# Patient Record
Sex: Female | Born: 1970 | Race: White | Hispanic: No | Marital: Married | Smoking: Former smoker
Health system: Southern US, Community
[De-identification: ages and names within clinical notes are randomized; demographics above are authoritative.]

## PROBLEM LIST (undated history)

## (undated) DIAGNOSIS — F319 Bipolar disorder, unspecified: Secondary | ICD-10-CM

## (undated) DIAGNOSIS — M1990 Unspecified osteoarthritis, unspecified site: Secondary | ICD-10-CM

## (undated) DIAGNOSIS — A609 Anogenital herpesviral infection, unspecified: Secondary | ICD-10-CM

## (undated) DIAGNOSIS — R87629 Unspecified abnormal cytological findings in specimens from vagina: Secondary | ICD-10-CM

## (undated) DIAGNOSIS — O24419 Gestational diabetes mellitus in pregnancy, unspecified control: Secondary | ICD-10-CM

## (undated) HISTORY — PX: ANKLE RECONSTRUCTION: SHX1151

## (undated) HISTORY — DX: Anogenital herpesviral infection, unspecified: A60.9

## (undated) HISTORY — DX: Unspecified osteoarthritis, unspecified site: M19.90

## (undated) HISTORY — DX: Unspecified abnormal cytological findings in specimens from vagina: R87.629

## (undated) HISTORY — PX: LEEP: SHX91

---

## 1997-11-20 ENCOUNTER — Inpatient Hospital Stay (HOSPITAL_COMMUNITY): Admission: AD | Admit: 1997-11-20 | Discharge: 1997-11-20 | Payer: Self-pay | Admitting: *Deleted

## 1997-11-21 ENCOUNTER — Ambulatory Visit (HOSPITAL_COMMUNITY): Admission: RE | Admit: 1997-11-21 | Discharge: 1997-11-21 | Payer: Self-pay | Admitting: *Deleted

## 1999-04-04 ENCOUNTER — Inpatient Hospital Stay (HOSPITAL_COMMUNITY): Admission: AD | Admit: 1999-04-04 | Discharge: 1999-04-04 | Payer: Self-pay | Admitting: *Deleted

## 2000-07-29 ENCOUNTER — Other Ambulatory Visit: Admission: RE | Admit: 2000-07-29 | Discharge: 2000-07-29 | Payer: Self-pay | Admitting: Gynecology

## 2000-10-25 ENCOUNTER — Other Ambulatory Visit: Admission: RE | Admit: 2000-10-25 | Discharge: 2000-10-25 | Payer: Self-pay | Admitting: Family Medicine

## 2000-11-17 ENCOUNTER — Encounter (INDEPENDENT_AMBULATORY_CARE_PROVIDER_SITE_OTHER): Payer: Self-pay

## 2000-11-17 ENCOUNTER — Other Ambulatory Visit: Admission: RE | Admit: 2000-11-17 | Discharge: 2000-11-17 | Payer: Self-pay | Admitting: Obstetrics and Gynecology

## 2000-12-29 ENCOUNTER — Ambulatory Visit (HOSPITAL_COMMUNITY): Admission: RE | Admit: 2000-12-29 | Discharge: 2000-12-29 | Payer: Self-pay | Admitting: Obstetrics and Gynecology

## 2000-12-29 ENCOUNTER — Encounter (INDEPENDENT_AMBULATORY_CARE_PROVIDER_SITE_OTHER): Payer: Self-pay

## 2001-04-11 ENCOUNTER — Other Ambulatory Visit: Admission: RE | Admit: 2001-04-11 | Discharge: 2001-04-11 | Payer: Self-pay | Admitting: Obstetrics and Gynecology

## 2001-08-01 ENCOUNTER — Other Ambulatory Visit: Admission: RE | Admit: 2001-08-01 | Discharge: 2001-08-01 | Payer: Self-pay | Admitting: Obstetrics and Gynecology

## 2002-01-02 ENCOUNTER — Other Ambulatory Visit: Admission: RE | Admit: 2002-01-02 | Discharge: 2002-01-02 | Payer: Self-pay | Admitting: Obstetrics and Gynecology

## 2002-04-18 ENCOUNTER — Other Ambulatory Visit: Admission: RE | Admit: 2002-04-18 | Discharge: 2002-04-18 | Payer: Self-pay | Admitting: Obstetrics and Gynecology

## 2002-11-28 ENCOUNTER — Other Ambulatory Visit: Admission: RE | Admit: 2002-11-28 | Discharge: 2002-11-28 | Payer: Self-pay | Admitting: Obstetrics and Gynecology

## 2003-04-10 ENCOUNTER — Ambulatory Visit (HOSPITAL_BASED_OUTPATIENT_CLINIC_OR_DEPARTMENT_OTHER): Admission: RE | Admit: 2003-04-10 | Discharge: 2003-04-11 | Payer: Self-pay | Admitting: Orthopedic Surgery

## 2003-04-15 ENCOUNTER — Emergency Department (HOSPITAL_COMMUNITY): Admission: EM | Admit: 2003-04-15 | Discharge: 2003-04-15 | Payer: Self-pay | Admitting: Emergency Medicine

## 2003-04-15 ENCOUNTER — Encounter: Payer: Self-pay | Admitting: *Deleted

## 2003-05-29 ENCOUNTER — Other Ambulatory Visit: Admission: RE | Admit: 2003-05-29 | Discharge: 2003-05-29 | Payer: Self-pay | Admitting: Obstetrics and Gynecology

## 2003-06-07 ENCOUNTER — Encounter: Admission: RE | Admit: 2003-06-07 | Discharge: 2003-07-11 | Payer: Self-pay | Admitting: Orthopedic Surgery

## 2003-07-30 ENCOUNTER — Ambulatory Visit (HOSPITAL_BASED_OUTPATIENT_CLINIC_OR_DEPARTMENT_OTHER): Admission: RE | Admit: 2003-07-30 | Discharge: 2003-07-30 | Payer: Self-pay | Admitting: Orthopedic Surgery

## 2003-09-20 ENCOUNTER — Encounter: Admission: RE | Admit: 2003-09-20 | Discharge: 2003-12-03 | Payer: Self-pay | Admitting: Orthopedic Surgery

## 2004-01-22 ENCOUNTER — Other Ambulatory Visit
Admission: RE | Admit: 2004-01-22 | Discharge: 2004-01-22 | Payer: Self-pay | Admitting: Physical Medicine & Rehabilitation

## 2004-08-04 ENCOUNTER — Ambulatory Visit (HOSPITAL_COMMUNITY): Admission: RE | Admit: 2004-08-04 | Discharge: 2004-08-04 | Payer: Self-pay | Admitting: Sports Medicine

## 2004-08-12 ENCOUNTER — Other Ambulatory Visit: Admission: RE | Admit: 2004-08-12 | Discharge: 2004-08-12 | Payer: Self-pay | Admitting: Obstetrics and Gynecology

## 2005-02-05 ENCOUNTER — Other Ambulatory Visit: Admission: RE | Admit: 2005-02-05 | Discharge: 2005-02-05 | Payer: Self-pay | Admitting: Obstetrics and Gynecology

## 2005-06-04 ENCOUNTER — Inpatient Hospital Stay (HOSPITAL_COMMUNITY): Admission: RE | Admit: 2005-06-04 | Discharge: 2005-06-08 | Payer: Self-pay | Admitting: Psychiatry

## 2005-06-05 ENCOUNTER — Ambulatory Visit: Payer: Self-pay | Admitting: Psychiatry

## 2005-09-03 ENCOUNTER — Inpatient Hospital Stay (HOSPITAL_COMMUNITY): Admission: AD | Admit: 2005-09-03 | Discharge: 2005-09-03 | Payer: Self-pay | Admitting: Obstetrics and Gynecology

## 2005-09-10 ENCOUNTER — Emergency Department (HOSPITAL_COMMUNITY): Admission: EM | Admit: 2005-09-10 | Discharge: 2005-09-10 | Payer: Self-pay | Admitting: Emergency Medicine

## 2005-11-07 ENCOUNTER — Inpatient Hospital Stay (HOSPITAL_COMMUNITY): Admission: AD | Admit: 2005-11-07 | Discharge: 2005-11-08 | Payer: Self-pay | Admitting: Obstetrics and Gynecology

## 2005-11-09 ENCOUNTER — Inpatient Hospital Stay (HOSPITAL_COMMUNITY): Admission: AD | Admit: 2005-11-09 | Discharge: 2005-11-12 | Payer: Self-pay | Admitting: Obstetrics & Gynecology

## 2007-03-19 ENCOUNTER — Inpatient Hospital Stay (HOSPITAL_COMMUNITY): Admission: AD | Admit: 2007-03-19 | Discharge: 2007-03-19 | Payer: Self-pay | Admitting: Obstetrics and Gynecology

## 2007-09-13 ENCOUNTER — Encounter: Admission: RE | Admit: 2007-09-13 | Discharge: 2007-09-16 | Payer: Self-pay | Admitting: Obstetrics and Gynecology

## 2007-11-03 ENCOUNTER — Inpatient Hospital Stay (HOSPITAL_COMMUNITY): Admission: RE | Admit: 2007-11-03 | Discharge: 2007-11-05 | Payer: Self-pay | Admitting: Obstetrics and Gynecology

## 2007-11-13 ENCOUNTER — Inpatient Hospital Stay (HOSPITAL_COMMUNITY): Admission: AD | Admit: 2007-11-13 | Discharge: 2007-11-13 | Payer: Self-pay | Admitting: Obstetrics & Gynecology

## 2009-04-15 ENCOUNTER — Emergency Department (HOSPITAL_COMMUNITY): Admission: EM | Admit: 2009-04-15 | Discharge: 2009-04-15 | Payer: Self-pay | Admitting: Family Medicine

## 2010-08-14 ENCOUNTER — Emergency Department (HOSPITAL_COMMUNITY)
Admission: EM | Admit: 2010-08-14 | Discharge: 2010-08-14 | Payer: Self-pay | Source: Home / Self Care | Admitting: Emergency Medicine

## 2010-08-17 ENCOUNTER — Emergency Department (HOSPITAL_COMMUNITY)
Admission: EM | Admit: 2010-08-17 | Discharge: 2010-08-17 | Payer: Self-pay | Source: Home / Self Care | Admitting: Emergency Medicine

## 2010-08-29 ENCOUNTER — Ambulatory Visit: Payer: Self-pay | Admitting: Internal Medicine

## 2010-08-29 DIAGNOSIS — J4489 Other specified chronic obstructive pulmonary disease: Secondary | ICD-10-CM | POA: Insufficient documentation

## 2010-08-29 DIAGNOSIS — J449 Chronic obstructive pulmonary disease, unspecified: Secondary | ICD-10-CM

## 2010-08-29 DIAGNOSIS — F319 Bipolar disorder, unspecified: Secondary | ICD-10-CM

## 2010-08-29 DIAGNOSIS — R911 Solitary pulmonary nodule: Secondary | ICD-10-CM

## 2010-09-20 ENCOUNTER — Encounter: Payer: Self-pay | Admitting: Sports Medicine

## 2010-10-02 NOTE — Assessment & Plan Note (Signed)
Summary: NEW TO EST/LUNG PROBLEM//PH   Vital Signs:  Patient profile:   40 year old female Height:      62.75 inches Weight:      130.4 pounds BMI:     23.37 Temp:     98.7 degrees F oral Pulse rate:   72 / minute Resp:     14 per minute BP sitting:   122 / 70  (left arm) Cuff size:   regular  Vitals Entered By: Shonna Chock CMA (August 29, 2010 12:01 PM) CC: New patient establish: Follow-up from [facility] (abnormal chest xray) Comments Patient accompanied by her Mother today    CC:  New patient establish: Follow-up from [facility] (abnormal chest xray).  History of Present Illness:    Alima is here to establish as a new patient. She was seen for acute bronchitis 08/17/2010 @ [facility] ER ;an incidental finding on CXray  was   a 14 mm nodule @ the [facility].  Her bronchitis has improved , but her smoker's cough persists. She started smoking @ age 58 ; she had  been @ a ppd  X 15 years; now 25 cigarettes in past 3 years.  Preventive Screening-Counseling & Management  Caffeine-Diet-Exercise     Does Patient Exercise: yes  Current Medications (verified): 1)  Zyprexa 20 Mg Tabs (Olanzapine) .Marland Kitchen.. 1 By Mouth Once Daily 2)  Lamictal Xr 300 Mg Xr24h-Tab (Lamotrigine) .Marland Kitchen.. 1 By Mouth Once Daily 3)  Valtrex 500 Mg Tabs (Valacyclovir Hcl) .Marland Kitchen.. 1 By Mouth Once Daily 4)  Tri-Sprintec 0.18/0.215/0.25 Mg-35 Mcg Tabs (Norgestim-Eth Estrad Triphasic) .... As Directed 5)  Vesicare 5 Mg Tabs (Solifenacin Succinate) .Marland Kitchen.. 1 By Mouth Once Daily (? 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         Weight variable Resp:  Denies chest pain with inspiration, coughing up blood, shortness of breath, and wheezing.  Physical Exam  General:  well-nourished,in no acute distress; alert,appropriate and cooperative throughout examination Head:  Normocephalic and atraumatic without obvious abnormalities.  Eyes:  No corneal or conjunctival inflammation noted.  Perrla. Funduscopic exam benign, without hemorrhages, exudates or papilledema.  Ears:  External ear exam shows no significant lesions or deformities.  Otoscopic examination reveals clear canals, tympanic membranes are intact bilaterally without bulging, retraction, inflammation or discharge. Hearing is grossly normal bilaterally. Nose:  External nasal examination shows no deformity or inflammation. External jewelry. Nasal mucosa are pink and moist without lesions or exudates. Mouth:  Oral mucosa and oropharynx without lesions or exudates.  Teeth in good repair. Hoarse Neck:  No deformities, masses, or tenderness noted. Lungs:  Normal respiratory effort, chest expands symmetrically. Lungs : scattered  coarse rhonchi , crackles  & wheezes. Heart:  Normal rate and regular rhythm. S1 and S2  normal without gallop, murmur, click, rub or other extra sounds. Abdomen:  Bowel sounds positive,abdomen soft and non-tender without masses, organomegaly or hernias noted. Pulses:  R and L carotid,radial,dorsalis pedis and posterior tibial pulses are full and equal  bilaterally Extremities:  No clubbing, cyanosis, edema, or deformity noted with normal full range of motion of all joints.   Neurologic:  alert & oriented X3 and DTRs symmetrical and normal.   Skin:  Intact without suspicious lesions or rashes Cervical Nodes:  No lymphadenopathy noted Axillary Nodes:  No palpable lymphadenopathy Psych:  memory intact for recent and remote, normally interactive, and good eye contact.     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 1-2 puffs every 12 hrs ; gargle & spit after use  Problem # 3:  CIGARETTE SMOKER (ICD-305.1)  Problem # 4:  BIPOLAR DISORDER UNSPECIFIED (ICD-296.80)  Complete Medication List: 1)  Zyprexa 20 Mg Tabs (Olanzapine) .Marland Kitchen.. 1 by mouth once daily 2)  Lamictal Xr 300 Mg Xr24h-tab (Lamotrigine) .Marland Kitchen.. 1 by mouth once daily 3)  Valtrex 500 Mg Tabs (Valacyclovir hcl) .Marland Kitchen.. 1 by mouth once daily 4)  Tri-sprintec 0.18/0.215/0.25 Mg-35 Mcg Tabs (Norgestim-eth estrad triphasic) .... As directed 5)  Vesicare 5 Mg Tabs (Solifenacin succinate) .Marland Kitchen.. 1 by mouth once daily (? dose) 6)  Symbicort 80-4.5 Mcg/act Aero (Budesonide-formoterol fumarate) .Marland Kitchen.. 1-2 puffs every 12 hrs ; gargle & spit after use  Patient Instructions: 1)  Please schedule a follow-up appointment in 6  weeks. 2)  Drink as much fluid as you can tolerate for the next few days. 3)  Stop Smoking Tips: Choose a Quit date. Cut down before the Quit date. decide what you will do as a substitute when you feel the urge to smoke(gum,toothpick,exercise). Prescriptions: SYMBICORT 80-4.5 MCG/ACT AERO (BUDESONIDE-FORMOTEROL FUMARATE) 1-2 puffs every 12 hrs ; gargle & spit after use  #1 x 1   Entered and Authorized by:   Marga Melnick MD   Signed by:    Marga Melnick MD on 08/29/2010   Method used:   Print then Give to Patient   RxID:   [id number]    Orders Added: 1)  New Patient Level IV [99204]

## 2010-10-10 ENCOUNTER — Ambulatory Visit: Payer: Self-pay | Admitting: Internal Medicine

## 2010-10-13 ENCOUNTER — Ambulatory Visit (INDEPENDENT_AMBULATORY_CARE_PROVIDER_SITE_OTHER): Payer: BC Managed Care – PPO | Admitting: Internal Medicine

## 2010-10-13 ENCOUNTER — Encounter: Payer: Self-pay | Admitting: Internal Medicine

## 2010-10-13 DIAGNOSIS — O99810 Abnormal glucose complicating pregnancy: Secondary | ICD-10-CM | POA: Insufficient documentation

## 2010-10-13 DIAGNOSIS — R5381 Other malaise: Secondary | ICD-10-CM

## 2010-10-13 DIAGNOSIS — R05 Cough: Secondary | ICD-10-CM

## 2010-10-13 DIAGNOSIS — R5383 Other fatigue: Secondary | ICD-10-CM | POA: Insufficient documentation

## 2010-10-15 ENCOUNTER — Other Ambulatory Visit: Payer: BC Managed Care – PPO

## 2010-10-20 ENCOUNTER — Other Ambulatory Visit: Payer: Self-pay | Admitting: Internal Medicine

## 2010-10-20 ENCOUNTER — Other Ambulatory Visit: Payer: BC Managed Care – PPO

## 2010-10-20 ENCOUNTER — Encounter (INDEPENDENT_AMBULATORY_CARE_PROVIDER_SITE_OTHER): Payer: Self-pay | Admitting: *Deleted

## 2010-10-20 ENCOUNTER — Ambulatory Visit (INDEPENDENT_AMBULATORY_CARE_PROVIDER_SITE_OTHER)
Admission: RE | Admit: 2010-10-20 | Discharge: 2010-10-20 | Disposition: A | Discharge status: Home / Self Care | Payer: BC Managed Care – PPO | Source: Ambulatory Visit | Attending: Internal Medicine | Admitting: Internal Medicine

## 2010-10-20 DIAGNOSIS — F17200 Nicotine dependence, unspecified, uncomplicated: Secondary | ICD-10-CM

## 2010-10-20 DIAGNOSIS — R5383 Other fatigue: Secondary | ICD-10-CM

## 2010-10-20 DIAGNOSIS — R5381 Other malaise: Secondary | ICD-10-CM

## 2010-10-20 DIAGNOSIS — R05 Cough: Secondary | ICD-10-CM

## 2010-10-20 DIAGNOSIS — O99810 Abnormal glucose complicating pregnancy: Secondary | ICD-10-CM

## 2010-10-20 LAB — CBC WITH DIFFERENTIAL/PLATELET
Basophils Absolute: 0 10*3/uL (ref 0.0–0.1)
Basophils Relative: 0.3 % (ref 0.0–3.0)
Eosinophils Absolute: 0.1 10*3/uL (ref 0.0–0.7)
Eosinophils Relative: 0.6 % (ref 0.0–5.0)
HCT: 40.5 % (ref 36.0–46.0)
Hemoglobin: 13.9 g/dL (ref 12.0–15.0)
Lymphocytes Relative: 16 % (ref 12.0–46.0)
Lymphs Abs: 1.7 10*3/uL (ref 0.7–4.0)
MCHC: 34.3 g/dL (ref 30.0–36.0)
MCV: 98.1 fl (ref 78.0–100.0)
Monocytes Absolute: 0.5 10*3/uL (ref 0.1–1.0)
Monocytes Relative: 4.3 % (ref 3.0–12.0)
Neutro Abs: 8.4 10*3/uL — ABNORMAL HIGH (ref 1.4–7.7)
Neutrophils Relative %: 78.8 % — ABNORMAL HIGH (ref 43.0–77.0)
Platelets: 252 10*3/uL (ref 150.0–400.0)
RBC: 4.12 Mil/uL (ref 3.87–5.11)
RDW: 13.6 % (ref 11.5–14.6)
WBC: 10.6 10*3/uL — ABNORMAL HIGH (ref 4.5–10.5)

## 2010-10-20 LAB — BASIC METABOLIC PANEL
BUN: 12 mg/dL (ref 6–23)
CO2: 25 mEq/L (ref 19–32)
Calcium: 9.4 mg/dL (ref 8.4–10.5)
Chloride: 108 mEq/L (ref 96–112)
Creatinine, Ser: 1.1 mg/dL (ref 0.4–1.2)
GFR: 57.36 mL/min — ABNORMAL LOW (ref >60.00)
Glucose, Bld: 96 mg/dL (ref 70–99)
Potassium: 4.9 mEq/L (ref 3.5–5.1)
Sodium: 138 mEq/L (ref 135–145)

## 2010-10-20 LAB — T4, FREE: Free T4: 0.78 ng/dL (ref 0.60–1.60)

## 2010-10-20 LAB — HEMOGLOBIN A1C: Hgb A1c MFr Bld: 5.4 % (ref 4.6–6.5)

## 2010-10-20 LAB — TSH: TSH: 1.35 u[IU]/mL (ref 0.35–5.50)

## 2010-10-20 LAB — HEPATIC FUNCTION PANEL
ALT: 12 U/L (ref 0–35)
AST: 13 U/L (ref 0–37)
Albumin: 3.8 g/dL (ref 3.5–5.2)
Alkaline Phosphatase: 31 U/L — ABNORMAL LOW (ref 39–117)
Bilirubin, Direct: 0.1 mg/dL (ref 0.0–0.3)
Total Bilirubin: 0.2 mg/dL — ABNORMAL LOW (ref 0.3–1.2)
Total Protein: 6.5 g/dL (ref 6.0–8.3)

## 2010-10-22 NOTE — Assessment & Plan Note (Signed)
Summary: 6 WEEK FOLLOWUP///SPH   Vital Signs:  Patient profile:   40 year old female Weight:      133 pounds BMI:     23.83 Pulse rate:   64 / minute Resp:     14 per minute BP sitting:   102 / 68  (left arm) Cuff size:   regular  Vitals Entered By: Shonna Chock CMA (October 13, 2010 10:16 AM) CC: 6 week follow-up and fatigue  , COPD follow-up, Fatigue   CC:  6 week follow-up and fatigue  , COPD follow-up, and Fatigue.  History of Present Illness:      This is a 40 year old woman who presents for  persistent cough following CAP 07/2010.  The patient reports wheezing, cough, and increased sputum in am Despite the cough she  denies shortness of breath, chest tightness, heat intolerance, nocturnal awakening, and exercise induced symptoms.  The patient reports no limitation in activities.  Medication use includes controller med intermittently, Symbicort once weekly.  Symptoms are worse  with cold exposure &  smoking; she is smoking 1 cigarette / hour but she is on a  weaning program. She quit with  inhaled nicotine from  cartridges for 5 months with 2nd pregnancy.She had Gestational Diabetes.      The patient also presents with Fatigue  for 3 weeks in context of children's school schedule.  The patient reports  intermittent fatigue and fatigue even without physical  activity  The patient denies fever, night sweats, weight loss, and hemoptysis.  The patient denies the following symptoms: leg swelling, orthopnea, PND, melena, adenopathy, severe snoring, daytime sleepiness, apnea, hair , nail and skin changes.    Current Medications (verified): 1)  Zyprexa 20 Mg Tabs (Olanzapine) .Marland Kitchen.. 1 By Mouth Once Daily 2)  Lamictal Xr 300 Mg Xr24h-Tab (Lamotrigine) .Marland Kitchen.. 1 By Mouth Once Daily 3)  Valtrex 500 Mg Tabs (Valacyclovir Hcl) .Marland Kitchen.. 1 By Mouth Once Daily 4)  Tri-Sprintec 0.18/0.215/0.25 Mg-35 Mcg Tabs (Norgestim-Eth Estrad Triphasic) .... As Directed 5)  Symbicort 80-4.5 Mcg/act Aero  (Budesonide-Formoterol Fumarate) .Marland Kitchen.. 1-2 Puffs Every 12 Hrs ; Gargle & Spit After Use  Allergies (verified): No Known Drug Allergies  Physical Exam  General:  well-nourished,in no acute distress; alert,appropriate and cooperative throughout examination Eyes:  No corneal or conjunctival inflammation noted. EOMI. Perrla. No icterus Neck:  No deformities, masses, or tenderness noted. Lungs:  Normal respiratory effort, chest expands symmetrically. Lungs are clear to auscultation, no crackles or wheezes. Heart:  Normal rate and regular rhythm. S1 and S2 normal without gallop, murmur, click, rub . S4 Abdomen:  Bowel sounds positive,abdomen soft and non-tender without masses, organomegaly or hernias noted. Very faint bruit over aorta ; no AAA Pulses:  R and L carotid,radial,dorsalis pedis and posterior tibial pulses are full and equal bilaterally Extremities:  No clubbing, cyanosis, edema, or deformity noted  Neurologic:  alert & oriented X3 and DTRs symmetrical and normal.   Skin:  Intact without suspicious lesions or rashes. No jaundice Cervical Nodes:  No lymphadenopathy noted Axillary Nodes:  No palpable lymphadenopathy Psych:  memory intact for recent and remote, normally interactive, and good eye contact.     Impression & Recommendations:  Problem # 1:  COUGH (ICD-786.2)  Orders: T-2 View CXR (71020TC)  Problem # 2:  FATIGUE (ICD-780.79)  Problem # 3:  GESTATIONAL DIABETES (ICD-648.80)  Complete Medication List: 1)  Zyprexa 20 Mg Tabs (Olanzapine) .Marland Kitchen.. 1 by mouth once daily 2)  Lamictal Xr 300 Mg  Xr24h-tab (Lamotrigine) .Marland Kitchen.. 1 by mouth once daily 3)  Valtrex 500 Mg Tabs (Valacyclovir hcl) .Marland Kitchen.. 1 by mouth once daily 4)  Tri-sprintec 0.18/0.215/0.25 Mg-35 Mcg Tabs (Norgestim-eth estrad triphasic) .... As directed 5)  Symbicort 80-4.5 Mcg/act Aero (Budesonide-formoterol fumarate) .Marland Kitchen.. 1-2 puffs every 12 hrs ; gargle & spit after use  Patient Instructions: 1)  Use Symbicort 1-2  puffs every 12 hrs ; gargle & spit after use.Fasting labs & CXray  @ [address]; Codes :780.79, 786.2; 648.80. 2)  BMP ; 3)  Hepatic Panel ; 4)  TSH ;free T4;  5)  CBC w/ Diff ; 6)  HbgA1C . 7)  Stop Smoking Tips: Choose a Quit date. Cut down before the Quit date. decide what you will do as a substitute when you feel the urge to smoke(gum,toothpick,exercise, nicotine cartridges).PFTs may be needed if cough persists.   Orders Added: 1)  Est. Patient Level IV [04540] 2)  T-2 View CXR [71020TC]

## 2011-01-13 NOTE — Discharge Summary (Signed)
[name], [name]               ACCOUNT NO.:  [id number]   MEDICAL RECORD NO.:  [id number]          PATIENT TYPE:  INP   LOCATION:  9127                          FACILITY:  [facility]   PHYSICIAN:  Gerrit Friends. Aldona Bar, M.D.   DATE OF BIRTH:  09/23/1970   DATE OF ADMISSION:  11/03/2007  DATE OF DISCHARGE:                               DISCHARGE SUMMARY   DISCHARGE DIAGNOSIS:  Term pregnancy.  Previous Cesarean section,  gestational diabetes, insulin dependent.  Desire for repeat Cesarean  section.   PROCEDURES:  Repeat low-transverse Cesarean section.   SUMMARY:  This 40 year old gravida 2, para 1 was admitted at 39-[redacted] weeks  gestation for repeat Cesarean section by Dr. Dareen Piano.  Her pregnancy  was complicated by gestational diabetes requiring insulin.  She also has  a bipolar disorder and is on medication for that.   She underwent her Cesarean section on the day of admission, 03/05 with  delivery of a 7 pound 3 ounce female infant with Apgars of 9 and 9.  Her  postoperative course was uncomplicated.  Her discharge hemoglobin was  11.8 with a white count of 10,500 and a platelet count of 244,000.  On  the morning of 03/07 she was ambulating well, tolerating a regular diet  well, was bottle feeding without difficulty and was very desirous of  discharge.  Accordingly she was given all appropriate instructions and  discharge prescriptions for Tylox to use 1-2 every 4-6 hours as needed  for severe pain and Motrin 600 mg every 6 hours to use for less severe  pain or cramping.  She will return to the office on 03/09 to see Dr.  Dareen Piano to have her staples removed and her wound Steri-Stripped with  Benzoin.   DISCHARGE MEDICATIONS:  In addition to her Tylox and Motrin include  Zyprexa 10 mg at bedtime, Valtrex 500 mg daily, Effexor 37.5 mg - three  every morning and she will finish up her prenatal vitamins.  Her insulin  was discontinued after delivery and her sugars in the hospital  postpartum  have been normal.   CONDITION ON DISCHARGE:  Improved.      Gerrit Friends. Aldona Bar, M.D.  Electronically Signed     RMW/MEDQ  D:  11/05/2007  T:  11/07/2007  Job:  [id number]

## 2011-01-13 NOTE — Op Note (Signed)
[name], [name]               ACCOUNT NO.:  [id number]   MEDICAL RECORD NO.:  [id number]          PATIENT TYPE:  INP   LOCATION:  9127                          FACILITY:  [facility]   PHYSICIAN:  Malva Limes, M.D.    DATE OF BIRTH:  April 24, 1971   DATE OF PROCEDURE:  11/03/2007  DATE OF DISCHARGE:                               OPERATIVE REPORT   PREOPERATIVE DIAGNOSES:  1. Intrauterine pregnancy at term.  2. History of prior cesarean section.  3. Patient desires repeat cesarean section.  4. Gestational diabetes, insulin-dependent.   POSTOPERATIVE DIAGNOSES:  1. Intrauterine pregnancy at term.  2. History of prior cesarean section.  3. Patient desires repeat cesarean section.  4. Gestational diabetes, insulin-dependent.   PROCEDURE:  Repeat low transverse cesarean section.   SURGEON:  Dareen Piano.   ASSISTANT:  Bowie.   ANESTHESIA:  Spinal.   ANTIBIOTICS:  Ancef 1 gram.   DRAINS:  Foley to bedside drainage.   ESTIMATED BLOOD LOSS:  900 mL.   COMPLICATIONS:  None.   SPECIMENS:  None.   PROCEDURE:  The patient was taken to the operating room where spinal  anesthetic was administered without complications.  She was then placed  in the dorsal supine position with a left lateral tilt.  She was prepped  with Betadine and draped in the usual fashion for this procedure.  A  Pfannenstiel incision was made through the previous scar.  This was  carried down to the fascia.  The fascia was entered in the midline and  extended laterally with the Metzenbaum scissors.  The fascia was then  dissected from the rectus muscles with the Bovie.  Rectus muscle were  divided in the midline and taken superiorly and inferiorly.  Parietal  peritoneum was entered sharply.  The bladder flap was taken down  sharply.  A low transverse uterine incision was made in the midline and  extended laterally with blunt dissection.  Amniotic fluid was noted to  be clear.  The infant was delivered in a vertex  presentation.  There was  a nuchal cord times one which was reduced.  The remaining infant was  then delivered, the cord doubly clamped and cut and the infant handed to  the awaiting NICU team.  Placenta was manually removed.  The uterus was  exteriorized.  The uterine cavity was cleaned with a wet lap.  The  uterine incision was closed in a single layer of 0 Monocryl suture in a  running locking fashion.  The bladder flap was closed using 2-0 Monocryl  in a running fashion.  The uterus was placed back into the abdominal  cavity and hemostasis checked and felt to be adequate.  The parietal  peritoneum and rectus muscles were reapproximated in the midline with 2-  0 Monocryl suture.  The fascia was closed using 0 Monocryl suture in a  running fashion.  Subcuticular tissue was made hemostatic with the  Bovie.  Stainless steel clips were used to close the skin.  The patient  tolerated the procedure well.  She was taken to the recovery room in  stable  condition.  Instrument and lap counts were correct x2.           ______________________________  Malva Limes, M.D.     MA/MEDQ  D:  11/03/2007  T:  11/03/2007  Job:  [id number]

## 2011-01-16 NOTE — Discharge Summary (Signed)
NAMERUPINDER, LIVINGSTON               ACCOUNT NO.:  [id number]   MEDICAL RECORD NO.:  [id number]          PATIENT TYPE:  INP   LOCATION:  9132                          FACILITY:  [facility]   PHYSICIAN:  Malva Limes, M.D.    DATE OF BIRTH:  01-16-1971   DATE OF ADMISSION:  11/09/2005  DATE OF DISCHARGE:  11/12/2005                                 DISCHARGE SUMMARY   FINAL DIAGNOSES:  I am going to have to cancel this discharge.  I have got  two run.  I will finish dictating this chart later.  Just ignore this  dictation.  Thank you.      Leilani Able, P.A.-C.    ______________________________  Malva Limes, M.D.    MB/MEDQ  D:  12/05/2005  T:  12/05/2005  Job:  [id number]

## 2011-01-16 NOTE — H&P (Signed)
[facility]  Patient:    [name], [name]                         MRN: [id number] Adm. Date:  12/29/00 Attending:  Beather Arbour. Thomasena Edis, M.D. CC:         Doreatha Lew, M.D., [facility] [place]   History and Physical  DATE OF BIRTH:                [birth year].  HISTORY OF PRESENT ILLNESS:   The patient is a 40 year old gravida 2, referred by Dr. 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 Subsequently, the patient underwent colposcopically guided biopsies which showed severe dysplasia, CIN-3, at the anterior and posterior cervix.  With ECC, there was a minute detached fragment of dysplastic squamous mucosa admixed with scanty endocervical mucosa.  I believe this was due to the dropping of the cells from the [facility] onto the posterior cervix as these were being removed.  The patient has bipolar disorder and states that there is no way that she can undergo a LEEP in the office, that she must be sedated because she would move around and could not be cooperative.  I attempted to get her to agree to a LEEP in the office but she contends that there is no way she can do this without sedation; she is thus admitted for a loop electrical excision procedure due to CIN-3.  I have explained that at [facility] they use a Bovie cautery unit instead of a LEEP machine and thus we may achieve more cautery artifact with the LEEP than would otherwise be the case with the traditional LEEP machine; however, I do agree that sedation will be necessary to adequately treat her.  Risks of surgery including anesthetic complication, hemorrhage or infection, damage to adjacent structures including bladder, bowel, blood vessels or ureters were discussed with the patient.  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  The risks of multiple procedures to her cervix with respect to resulting in cervical stenosis, infertility and incompetent cervix as well as dysmenorrhea due to cervical stenosis were discussed with the patient.  She continues to express understanding of this.  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  She has no history of DES in utero exposure as she is too young for this to have been given.  She has a history of a condyloma and pelvic inflammatory disease.  She did have an ASCUS Pap one and a half years ago and has been followed with Pap smears every six months.  She has had 50 to 60 sexual partners over her life.  PAST MEDICAL HISTORY:         Bipolar disorder, addiction disorder -- these are followed by Dr. Hortencia Pilar.  She also has a history of a seasonal affective disorder.  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  PAST SURGICAL HISTORY:        History of a laser cone biopsy in 1996 at the [facility].  ALLERGIES:  No known drug allergies.  CURRENT MEDICATIONS:          Effexor XR 75 mg daily and Zyprexa 10 mg q.d.  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  Currently, she would like to return to obtain a masters degree and is applying to [employer] for a masters degree in rhetoric and linguistics.  FAMILY HISTORY:               Noncontributory.  PHYSICAL EXAMINATION:  VITAL SIGNS:                  Blood pressure 120/60.  Weight 136 pounds.  HEENT:                        Normal.  NECK:                         Supple without thyromegaly.  LUNGS:                         Clear to auscultation.  CARDIAC:                      Regular rate and rhythm, without murmurs, rubs, or gallops.  ABDOMEN:                      Soft and nontender.  No hepatosplenomegaly.  PELVIC:                       BUS normal.  Vulva and vagina are normal. Speculum examination revealed a normal cervix.  Bimanual examination revealed her uterus to be normal, nontender and mobile, without any adnexal mass or tenderness.  LABORATORY DATA:              HIV and RPR nonreactive.  Wet prep was performed and noted to be positive for clue cells and yeast; the patient was treated with Flagyl 500 mg p.o. b.i.d. to treat her bacterial vaginosis prior to proceeding with LEEP.  ASSESSMENT AND PLAN:          The patient is a 40 year old gravida 0 with cervical intraepithelial neoplasia, grade 3, on colposcopically guided biopsies, admitted for a loop electrosurgical excision procedure to be done with lidocaine with epinephrine and sedation.  Risks have been explained to the patient; she expresses understanding of and acceptance of these risks and desires to proceed with surgery. DD:  12/29/00 TD:  12/29/00 Job: [id number]

## 2011-01-16 NOTE — Discharge Summary (Signed)
[name], [name]               ACCOUNT NO.:  [id number]   MEDICAL RECORD NO.:  [id number]          PATIENT TYPE:  IPS   LOCATION:  0506                          FACILITY:  [facility]   PHYSICIAN:  Jeanice Lim, M.D. DATE OF BIRTH:  1970-10-21   DATE OF ADMISSION:  06/04/2005  DATE OF DISCHARGE:  06/08/2005                                 DISCHARGE SUMMARY   IDENTIFYING DATA:  This is a 40 year old Caucasian female, married,  voluntarily admitted.  Referred by Valinda Hoar, nurse practitioner.  History of bipolar disorder.  Had not slept for two weeks since stopping  Zyprexa.  Lost 16 pounds in the past two months.  Felt very unstable, crying  spells every day.  Feeling unsafe and helpless, tearful and agitated.  History of more than 10 psychiatric admissions.  Multiple admissions to  [facility] and psychiatric hospitalizations between 1992 and 1998.  History of prior suicide attempts.   MEDICATIONS:  Valtrex, prenatal vitamins.  Previously stable on Lexapro and  Zyprexa and Depakote stopped one year ago, the Depakote, and then gradually  stopped medications for different reasons, including pregnancy.   MENTAL STATUS EXAM:  Fully alert, bright affect, anxious, somewhat labile  but cooperative.  Speech normal, rapid pace.  Impulsive.  Mood anxious, some  guardedness.  Thought processes goal directed.  Mild general suspiciousness.  No other psychotic symptoms.  Not sleeping.  Mood unstable.  Doing poorly,  which is beginning to be a physical stressor as well as an emotional  stressor.  She is now willing to take risks with psychiatric medications  because of the severity of her psychiatric condition, which again includes  over 10 psychiatric admissions since a young age.  Becomes very suicidal  when unstable.  Cognitively intact.  No other psychotic symptoms such as  hallucinations at this time.   ADMISSION DIAGNOSES:  AXIS I:  Bipolar disorder, type 1, mixed state.  Polysubstance abuse in a six-year remission.  AXIS II:  Deferred.  AXIS III:  Intrauterine pregnancy, 14-week gestation.  AXIS IV:  Moderate (pregnancy and managing bipolar disorder off of  medications).  AXIS V:  30/65.   HOSPITAL COURSE:  The patient was admitted and ordered routine p.r.n.  medications and underwent further monitoring.  Was encouraged to participate  in individual, group and milieu therapy.  Was placed on safety checks.  Dr.  __________ was contacted on two occasions.  Doctor's office contacted  regarding medications to get approval of medications that she clearly  requires to remain safe.  The patient was aware of the risk/benefit ratio  and alternative treatments including potential risks to the fetus but felt  that the severity of her illness and this being untreated clearly was worse  than the risk of her taking medications, which was clearly accurate based on  the patient's history and presentation.  The patient was stabilized on  medications.  There was further communication with OB/GYN and with  outpatient providers and with support system and patient showed a marked  improvement, improved sleep, improved appetite, thinking, felt much more  stable and in  control and much more hopeful.   CONDITION ON DISCHARGE:  Discharged in significantly improved condition.  Aware of the chance of metabolic issues with Zyprexa, including diabetes  and, in this case, gestational diabetes, triglyceride abnormalities, weight  gain and, if significant abnormalities occur, then patient will need to be  switched to another medication.  However, due to her response, she can have  close monitoring of these parameters including hemoglobin C and lipid panel  and fasting glucoses and attempt to continue these medications since she has  had a robust response and has a very serious condition which has not  responded to other forms of treatment.  OB/GYN doctor also was aware of  metabolic  issues with Zyprexa and safety of medications and in agreement  that patient required medications.  The patient was set up with follow-up  appointments with Dr. Evelene Croon on Wednesday, June 24, 2005 at 68 and [facility] on Tuesday, June 09, 2005 at 3:30 p.m.   DISCHARGE MEDICATIONS:  1.  Lexapro 5 mg daily.  2.  Vistaril 50 mg q.12h. p.r.n. anxiety.  3.  Zyprexa 10 mg at 9 p.m.   DISCHARGE DIAGNOSES:  AXIS I:  Bipolar disorder, type 1, mixed state.  Polysubstance abuse in a six-year remission.  AXIS II:  Deferred.  AXIS III:  Intrauterine pregnancy, 14-week gestation.  AXIS IV:  Moderate (pregnancy and managing bipolar disorder off of  medications).  AXIS V:  GAF on discharge 55.      Jeanice Lim, M.D.  Electronically Signed     JEM/MEDQ  D:  07/05/2005  T:  07/05/2005  Job:  [id number]

## 2011-01-16 NOTE — Discharge Summary (Signed)
[name], [name]               ACCOUNT NO.:  [id number]   MEDICAL RECORD NO.:  [id number]          PATIENT TYPE:  INP   LOCATION:  9132                          FACILITY:  [facility]   PHYSICIAN:  Malva Limes, M.D.    DATE OF BIRTH:  1970/10/02   DATE OF ADMISSION:  11/09/2005  DATE OF DISCHARGE:  11/12/2005                                 DISCHARGE SUMMARY   FINAL DIAGNOSES:  1.  Intrauterine pregnancy at 36-1/[redacted] weeks gestation.  2.  Spontaneous rupture of membranes.  3.  Cephalopelvic disproportion.   PROCEDURE:  Primary low transverse cesarean section.  Surgeon Dr. Malva Limes.  Complications none.   HISTORY:  This 40 year old G1, P0 presents at 36-1/[redacted] weeks gestation with  spontaneous rupture of membranes.  The patient's antepartum course up to  this point had been complicated by her history of bipolar disease.  The  patient was on Zyprexa at the beginning of her pregnancy for this and on  some Lexapro.  The patient also has a history of HSV and was taking Valtrex  daily.  The patient also had a history of drug use but has been clean for  about 6 years.  She was admitted on November 09, 2005.   HOSPITAL COURSE:  She completed the first stage of labor without difficulty.  She pushed for about 2 hours, and the head did not descend past a zero  station.  At this point, a discussion was had with the patient, and the  decision was made to proceed with a cesarean section secondary to  cephalopelvic disproportion.  The patient was taken to the operating room on  November 09, 2005 by Dr. Malva Limes where a primary low transverse cesarean  section was performed with the delivery of a 7 pound 8 ounces female infant  with Apgars of 9/9. Delivery went without complications.  The patient's  postoperative course was complicated by some mild postoperative anemia, but  otherwise was normal.  The baby was circumcised before discharge, and the  patient was felt ready for discharge on postoperative  day #3.   DISCHARGE DIET:  She was sent home on her regular diet.   ACTIVITY:  She was told to decrease her activities.   DISCHARGE MEDICATIONS:  1.  She was given Percocet 1-2 every 4 hours as needed for pain.  2.  She was told she could use over-the-counter ibuprofen up to 600 mg every      6 hours needed for pain.  3.  She was to continue her prenatal vitamins.  She was also to continue her medicines that she takes on a daily basis which  are:  1.  Lexapro.  2.  Zyprexa.  3.  Valtrex.   LABORATORIES ON DISCHARGE:  The patient had a hemoglobin of 9.4, white blood  cell count of 14.1 and platelets of 249,000.      Leilani Able, P.A.-C.    ______________________________  Malva Limes, M.D.    MB/MEDQ  D:  12/05/2005  T:  12/06/2005  Job:  [id number]

## 2011-01-16 NOTE — Op Note (Signed)
[facility]  Patient:    [name], [name]                       MRN: [id number] Proc. Date: 12/29/00 Adm. Date:  [id number] Attending:  Madelyn Flavors CCDeboraha Sprang [facility]ne[facility] [address] M.D. 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  PROCEDURE:                    Loop electrical excision procedure.  SURGEON:                      Beather Arbour. Thomasena Edis, M.D., Ph.D.  ANESTHESIA:                   IV sedation plus 1% lidocaine with                               1:200,000 epinephrine paracervical block.  COMPLICATIONS:                None.  FLUIDS:                       Approximately 500 cc of crystalloid.  ESTIMATED BLOOD LOSS:         Minimal.  DESCRIPTION OF PROCEDURE:     The patient was brought to the operating room, identified on the operating room table. After adequate IV sedation was achieved using Versed and propofol, the patient was placed in the dorsal lithotomy position and draped in the usual sterile fashion. Bimanual examination revealed the uterus to be in ______ position, anteverted, small, and mobile. The LEEP speculum was placed. A careful and thorough colposcopic examination was again performed with the CIN-3 areas identified. The 10 mm x 10 mm loop electrode was selected as this was deemed to excise the squamocolumnar junction with one pass, and in the most effective manner. The Bovie unit was placed on 35 watts. For this loop, the manufacturer of the Bovie unit suggested placing this at 30-35 pure cut. Because I have known these to drag with performance of the LEEP procedure, we placed this at 35 watts. The cervix  is noted on the right hand side at the 9 oclock position to dimple in toward the vaginal wall as a result of the patients previous laser surgery. It should be noted that acetic acid was placed for visualization of the abnormal areas. With the LEEP unit on 35 watts, the loop was placed at the 9 oclock position and the squamocolumnar junction was excised; however, with coming across the midportion at approximately 12 oclock, the loop would not pass through the tissue and thus, excising the LEEP from that area was aborted. The machine was  turned up to 40 and I did the excision starting from the left at the 9 oclock position and moving to the 3 oclock position. Unfortunately, it was difficult to orient the specimen but this was ultimately not performed according to the specifications of the pathology department. There was no small loop for me to do a button, and thus, the decision was made to use ball cautery to achieve the same result. Thus, the area around the cervical canal was cauterized to approximately 2 to 3 more mm. Using the ball cautery, the entire column bed was cauterized and the entire surface of the cervix was cauterized. It should be noted that the depth was 7 mm on the loop. There was noted to be no bleeding once ______ was placed. The patient tolerated the procedure well and was transferred to the recovery room in stable condition after all instrument, sponge, and needle counts were correct. She was given postoperative instructions, ordered to make no financial decisions for 24 hours, to not drive for 24 hours, and to place nothing in her vagina. She is to return for a postoperative visit in two weeks, but no intercourse for six. DD:  12/29/00 TD:  12/30/00 Job: [id number]

## 2011-01-16 NOTE — Op Note (Signed)
[name], [name]               ACCOUNT NO.:  [id number]   MEDICAL RECORD NO.:  [id number]          PATIENT TYPE:  INP   LOCATION:  9132                          FACILITY:  [facility]   PHYSICIAN:  Malva Limes, M.D.    DATE OF BIRTH:  1971/04/29   DATE OF PROCEDURE:  11/09/2005  DATE OF DISCHARGE:                                 OPERATIVE REPORT   PREOPERATIVE DIAGNOSES:  1.  Intrauterine pregnancy at 36-1/2 weeks' estimated gestational age.  2.  Cephalopelvic disproportion.   POSTOPERATIVE DIAGNOSES:  1.  Intrauterine pregnancy at 36-1/2 weeks' estimated gestational age.  2.  Cephalopelvic disproportion.   PROCEDURE:  Primary low transverse cesarean section.   SURGEON:  Dr. Dareen Piano   ANESTHESIA:  Epidural.   ANTIBIOTICS:  Ancef 1 g.   ESTIMATED BLOOD LOSS:  900 mL.   COMPLICATIONS:  None.   SPECIMENS:  None.   DRAINS:  Foley to bedside drainage.   FINDINGS:  The patient had normal fallopian tubes and ovaries bilaterally.  The uterus appeared to be normal.  The placenta was rather large.  The  patient delivered 1 live viable infant, weighing 7 pounds 8 ounces.   PROCEDURE:  The patient was taken to the operating room where epidural  anesthetic was reinjected.  Once an adequate level was reached, she was  prepped with Betadine and draped in the usual fashion.  A Pfannenstiel  incision was made.  This was carried down to the fascia and fascia was  entered in the midline and extended laterally with the Mayo scissors.  The  rectus muscles were dissected from the fascia with Bovie.  The rectus  muscles were divided in the midline and taken superiorly and inferiorly.  The parietal peritoneum was entered sharply and taken superiorly and  inferiorly.  The bladder flap was taken down sharply.  A low transverse  uterine incision was made in the midline and extended laterally with blunt  dissection.  Amniotic fluid was noted to be clear.  The infant was delivered  in the vertex  presentation.  On delivery of the head, the oropharynx and  nostrils were bulb suctioned.  The infant was then delivered.  The cord was  doubly clamped and cut and the infant handed to the waiting NICU team.  Placenta was manually removed.  The uterus was exteriorized.  The uterine  cavity was cleaned with a wet lap.  The uterine incision was closed in a  single layer of 0 Monocryl in a running locking fashion.  The bladder flap  was closed using 0 Monocryl in running fashion.  The uterus was placed back  into the abdominal cavity.  Hemostasis was checked and found to be adequate.  The parietal peritoneum and rectus muscles are reapproximated in the midline  using 2-0 Monocryl in a running fashion.  The rectus fascia was closed using  0 Monocryl suture in a running fashion.  Subcuticular tissue was made  hemostatic with the Bovie.  Skin clips were used to close the skin.  The  patient tolerated the procedure well.  She was taken to the  recovery room in  stable condition.  Instrument and lap counts were correct x2.           ______________________________  Malva Limes, M.D.     MA/MEDQ  D:  11/09/2005  T:  11/10/2005  Job:  [id number]

## 2011-01-16 NOTE — Op Note (Signed)
[facility]  Patient:    [name], [name]                       MRN: [id number] Adm. Date:  [id number] Attending:  Madelyn Flavors                           Operative Report  ADDENDUM:                     I dictated that I used the 10 x 10 mm loop. That was erroneous.  I was just not given the new paper.  We will use the 15 mm x 12 mm round loop for the loop excision procedure.  So, if you can please change that.  Thank you. DD:  12/29/00 TD:  12/30/00 Job: [id number]

## 2011-01-16 NOTE — Op Note (Signed)
[name], POSTER                          ACCOUNT NO.:  [id number]   MEDICAL RECORD NO.:  [id number]                   PATIENT TYPE:  AMB   LOCATION:  DSC                                  FACILITY:  [facility]   PHYSICIAN:  Robert A. Thurston Hole, M.D.              DATE OF BIRTH:  March 18, 1971   DATE OF PROCEDURE:  04/10/2003  DATE OF DISCHARGE:                                 OPERATIVE REPORT   PREOPERATIVE DIAGNOSIS:  Left ankle instability with cellulitis.   POSTOPERATIVE DIAGNOSIS:  Left ankle instability with cellulitis.   PROCEDURE:  1. Left ankle UA followed by arthroscopic partial synovectomy.  2. Left ankle modified Brostrom ligamentous reconstruction.   SURGEON:  Elana Alm. Thurston Hole, M.D.   ASSISTANT:  Julien Girt, P.A.   ANESTHESIA:  General.   OPERATIVE TIME:  One hour.   COMPLICATIONS:  None.   INDICATIONS FOR PROCEDURE:  [name] is a 40 year old woman  who has had  painful recurrent left ankle sprains and recurrent synovitis over the past  two to three years. Because of this and having failed conservative care she  is to undergo ankle arthroscopy and ligamentous reconstruction.   DESCRIPTION OF PROCEDURE:  [name] was brought to the operating room on  April 10, 2003 after ankle block had been placed in the holding room by  anesthesia for postoperative pain control. She was placed on the operating  table in supine position. After being placed under general anesthesia her  left ankle was examined under anesthesia. She had range of motion,  dorsiflexion of five degrees, plantar flexion of 20 degrees. She had 2+  anterior drawer and talar tilt. Intraoperative fluoroscopy stress x-rays of  the ankle showed approximately 10 degrees of talar tilt. The anteromedial  and anterolateral portal sites were thoroughly injected with 0.25% Marcaine  with epinephrine. She received Ancef 1 g IV preoperatively for prophylaxis.  After the leg was prepped using sterile  Duraprep and draped using sterile  technique then the foot and leg was exsanguinated and a calf tourniquet to  275 mmHg. Initially the arthroscopy was performed through an anteromedial  portal site incising only the skin and carefully dissecting down to the  joint. The arthroscope with a pump attached was placed and through and  anterolateral portal site again incising only the skin and carefully  dissecting down to the joint. An arthroscopic probe was placed. On initial  inspection the articular cartilage on the talar dome and tibial plateau was  found to be intact. Moderate synovitis in the anterior and anterolateral  gutter was thoroughly debrided. The articular surfaces on the lateral  malleolus and lateral talus were intact. No loose bodies were noted.  Medially moderate synovitis and in the medial gutter and anterior medial  aspect of the joint was also debrided. The articular surfaces on the medial  malleolus and medial talus were also found to be intact. After  this partial  synovectomy was then carried and then the arthroscopic instruments were  removed. The lateral portal was then extended to a 3 cm curvilinear incision  based over the anterior talar fibular ligament and capsular complex. The  underlying subcutaneous tissues were incised in line with the skin incision.  The dorso cutaneous nerves carefully protected while the underlying anterior  talar fibular ligament capsular complex was incised longitudinally. It was  found to be lax and redundant. Moderate hypertrophic synovitis around this  was thoroughly debrided. The peroneal tendon sheath was not exposed and not  entered. After this had been carried out and the lax tissue was further  debrided back to healthy appearing tissue comprising the anterior talar  fibular ligament and capsular complex then multiple 2-0 fiber wire sutures  were placed in a mattress suture technique. The ankle was then brought up  into neutral and  slightly everted position and then each of these sutures  were tied down, thus tightening and repairing the anterior talar fibular  ligament capsular complex. This eliminated approximately 80-90% of the  instability. The fascia over this was then also closed and with a wreathing  type procedure using 2-0 Vicryl suture this was closed as well. After this  was done, then it was found that all the instability was totally eliminated.  The wound was then copiously irrigated and then closed with 3-0 Vicryl and 3-  0 Prolene. Steri-Strips were applied. Sterile dressings and a short leg  splint were applied. The tourniquet was released and the patient awakened  and taken to the recovery room in stable condition.   FOLLOW UP CARE:  [name] will be followed overnight in the recovery care  center for IV pain control and neurovascular monitoring. Discharge tomorrow  on Percocet for pain. Seen back in the office in a week for sutures out and  follow-up.                                               Robert A. Thurston Hole, M.D.    RAW/MEDQ  D:  04/10/2003  T:  04/10/2003  Job:  [id number]

## 2011-01-16 NOTE — Op Note (Signed)
[name], [name]                          ACCOUNT NO.:  [id number]   MEDICAL RECORD NO.:  [id number]                   PATIENT TYPE:  AMB   LOCATION:  DSC                                  FACILITY:  [facility]   PHYSICIAN:  Robert A. Thurston Hole, M.D.              DATE OF BIRTH:  02-16-1971   DATE OF PROCEDURE:  07/30/2003  DATE OF DISCHARGE:                                 OPERATIVE REPORT   PREOPERATIVE DIAGNOSIS:  Right ankle instability with synovitis.   POSTOPERATIVE DIAGNOSIS:  Right ankle instability with synovitis.   PROCEDURE:  1. Right ankle examination under anesthesia, followed by arthroscopic     partial synovectomy.  2. Right ankle modified Brostrom ligamentous reconstruction.   SURGEON:  Elana Alm. Thurston Hole, M.D.   ASSISTANT:  Julien Girt, P.A.   ANESTHESIA:  General.   OPERATIVE TIME:  One hour.   COMPLICATIONS:  None.   INDICATIONS FOR PROCEDURE:  The patient is a 40 year old who has had painful  recurrent instability and synovitis of her right ankle that has failed  conservative care, and is now to undergo an arthroscopy and a ligamentous  reconstruction.   DESCRIPTION OF PROCEDURE:  The patient was brought to the operating room on  July 30, 2003, and placed on the operating room table in the supine  position, after an ankle block had been placed in the holding room.  She was  placed on the operating room table in the supine position.  After being  placed under general anesthesia, her right ankle was examined under  anesthesia.  She had dorsiflexion of 70 degrees, plantar flexion of 20  degrees.  She had 2+ anterior drawer and talar tilt.  She received Ancef 1 g  IV preoperatively for prophylaxis.  Her right foot and leg were prepped  using sterile Betadine and draped using a sterile technique.  Originally the  foot and leg were exsanguinated and a calf tourniquet elevated to 275 mmHg.  Initially the arthroscopy was performed using careful incisions  in the  anteromedial and anterolateral portal sites, incising only the skin and  carefully dissecting down to the joint, to prevent any injury to the dorsal  cutaneous nerves.  The arthroscope with the pump attached was placed in the  medial portal and an arthroscopic probe laterally.  The articular surfaces  on the talar dome and the tibial plafond were found to be intact.  Significant synovitis anterolaterally was debrided.  The lateral gutter  showed the articular surface on the lateral malleolus and lateral talus was  intact.  Moderate synovitis in the lateral gutter was debrided.  Otherwise  it was free of pathology.  Medially moderate synovitis was debrided.  The  medial malleolus and medial talus articular cartilage was intact.  The  medial gutter showed moderate synovitis which was debrided, but it was  otherwise free of pathology.  After this was done, the arthroscopic  instruments were removed.  At this point a 7 cm longitudinal incision was  made over the distal peroneal tendon sheath, across the distal fibula and to  an area just proximal to the fifth metatarsal base.  The underlying  subcutaneous tissues were incised in line with the skin incision.  The sural  nerve was carefully protected, while an oblique incision was made across the  anterior talofibular ligament capsular complex.  This was found to be torn  and redundant tissue, and this was debrided back to healthy-appearing  tissue.  Multiple #2-0 fiber wire sutures in a mattress suture technique  were placed through these anterior talofibular ligament capsular complex  tissues, to be tied later.  After this was done the peroneal tendon sheath  was opened proximally.  The peroneus brevis was exposed, and one-half of the  peroneus brevis was harvested distally, to a level just proximal to the  fifth metatarsal base attachment.  It was then tunneled through the soft  tissue sites through the anterior talofibular ligament  capsular complex, and  then a drill hole placed from anterior to posterior in the distal fibula to  pass the peroneus brevis graft through from anterior to posterior, and then  back on itself.  Prior to tying this, the ankle was placed in a reduced and  neutral and a slightly everted position, and each of the three individual  ATF capsular sutures were tied tightly.  This eliminated approximately 80%-  90% of the instability.  After this was done, then the peroneus brevis for  additional reconstruction was tied back to itself over the fibula bone.  This completely eliminated all instability and gave excellent fixation.  After this was done, the ankle could be brought through a full range of  motion, with no instability.  The fascia over the capsular sutures were  closed with #2-0 Vicryl.  The peroneal tendon sheath closed with #2-0  Vicryl.  The wound was copiously irrigated.  The subcutaneous tissues were  closed with #2-0 Vicryl.  The skin was closed with skin staples.  The wound  was injected with 0.25% Marcaine.  Sterile dressings and a short-leg splint  applied.  The tourniquet was released.  The patient was awakened and taken to the recovery room in stable condition.   FOLLOW-UP CARE:  The patient will be followed overnight in the recovery care  center for IV pain control and neurovascular monitoring.  She will be  discharged tomorrow on Percocet for pain.  I will see her back in the office  in one week for sutures out and for followup.                                               Robert A. Thurston Hole, M.D.    RAW/MEDQ  D:  07/30/2003  T:  07/30/2003  Job:  [id number]

## 2011-02-01 ENCOUNTER — Emergency Department (HOSPITAL_COMMUNITY)
Admission: EM | Admit: 2011-02-01 | Discharge: 2011-02-01 | Disposition: A | Discharge status: Home / Self Care | Payer: BC Managed Care – PPO | Attending: Emergency Medicine | Admitting: Emergency Medicine

## 2011-02-01 DIAGNOSIS — R109 Unspecified abdominal pain: Secondary | ICD-10-CM | POA: Insufficient documentation

## 2011-02-01 DIAGNOSIS — F3130 Bipolar disorder, current episode depressed, mild or moderate severity, unspecified: Secondary | ICD-10-CM | POA: Insufficient documentation

## 2011-02-01 DIAGNOSIS — Z79899 Other long term (current) drug therapy: Secondary | ICD-10-CM | POA: Insufficient documentation

## 2011-02-01 DIAGNOSIS — R110 Nausea: Secondary | ICD-10-CM | POA: Insufficient documentation

## 2011-02-01 LAB — URINALYSIS, ROUTINE W REFLEX MICROSCOPIC
Bilirubin Urine: NEGATIVE
Glucose, UA: NEGATIVE mg/dL
Ketones, ur: NEGATIVE mg/dL
Nitrite: NEGATIVE
Protein, ur: NEGATIVE mg/dL
Urobilinogen, UA: 0.2 mg/dL (ref 0.0–1.0)
pH: 6.5 (ref 5.0–8.0)

## 2011-02-01 LAB — POCT PREGNANCY, URINE: Preg Test, Ur: NEGATIVE

## 2011-05-25 LAB — CBC
HCT: 34.2 — ABNORMAL LOW
HCT: 37.7
Hemoglobin: 11.8 — ABNORMAL LOW
Hemoglobin: 13
MCHC: 34.5
MCHC: 34.6
MCV: 87.8
MCV: 88
Platelets: 237
Platelets: 244
RBC: 3.88
RBC: 4.29
RDW: 16.2 — ABNORMAL HIGH
RDW: 16.7 — ABNORMAL HIGH
WBC: 10.5
WBC: 11.7 — ABNORMAL HIGH

## 2011-05-25 LAB — BASIC METABOLIC PANEL
BUN: 10
CO2: 17 — ABNORMAL LOW
Calcium: 10
Chloride: 102
Creatinine, Ser: 0.83
GFR calc Af Amer: >60
GFR calc non Af Amer: >60
Glucose, Bld: 119 — ABNORMAL HIGH
Potassium: 4
Sodium: 132 — ABNORMAL LOW

## 2011-05-25 LAB — RPR: RPR Ser Ql: NONREACTIVE

## 2011-06-15 ENCOUNTER — Ambulatory Visit: Payer: BC Managed Care – PPO | Admitting: Internal Medicine

## 2011-06-15 LAB — HCG, QUANTITATIVE, PREGNANCY: hCG, Beta Chain, Quant, S: 17573 — ABNORMAL HIGH

## 2011-06-17 ENCOUNTER — Other Ambulatory Visit: Payer: Self-pay | Admitting: Family Medicine

## 2011-06-17 ENCOUNTER — Ambulatory Visit
Admission: RE | Admit: 2011-06-17 | Discharge: 2011-06-17 | Disposition: A | Discharge status: Home / Self Care | Payer: BC Managed Care – PPO | Source: Ambulatory Visit | Attending: Family Medicine | Admitting: Family Medicine

## 2011-06-17 DIAGNOSIS — S0990XA Unspecified injury of head, initial encounter: Secondary | ICD-10-CM

## 2011-06-17 DIAGNOSIS — G44009 Cluster headache syndrome, unspecified, not intractable: Secondary | ICD-10-CM

## 2011-06-18 ENCOUNTER — Encounter: Payer: Self-pay | Admitting: Internal Medicine

## 2011-06-18 ENCOUNTER — Ambulatory Visit: Payer: BC Managed Care – PPO | Admitting: Internal Medicine

## 2011-06-25 ENCOUNTER — Ambulatory Visit: Payer: BC Managed Care – PPO | Admitting: Family Medicine

## 2011-06-25 ENCOUNTER — Ambulatory Visit (INDEPENDENT_AMBULATORY_CARE_PROVIDER_SITE_OTHER): Payer: BC Managed Care – PPO | Admitting: Internal Medicine

## 2011-06-25 ENCOUNTER — Encounter: Payer: Self-pay | Admitting: Internal Medicine

## 2011-06-25 DIAGNOSIS — S060X1A Concussion with loss of consciousness of 30 minutes or less, initial encounter: Secondary | ICD-10-CM

## 2011-06-25 DIAGNOSIS — R51 Headache: Secondary | ICD-10-CM

## 2011-06-25 MED ORDER — TRAMADOL HCL 50 MG PO TABS: 50.0000 mg | ORAL_TABLET | Freq: Four times a day (QID) | ORAL | Status: DC | PRN

## 2011-06-25 NOTE — Patient Instructions (Addendum)
Please keep a diary of your headaches . Document  each occurrence on the calendar with notation of : #1 any prodrome ( any non headache symptom such as marked fatigue,visual changes, ,etc ) which precedes actual headache ; #2) severity on 1-10 scale; #3) any triggers ( food/ drink,enviromenntal or weather changes ,physical or emotional stress) in 8-12 hour period prior to the headache; & #4) response to any medications or other intervention. Please review "Headache" @ [facility] for additional information.  Stop Fioricet for the reasons we discussed.

## 2011-06-25 NOTE — Progress Notes (Signed)
  Subjective:    Patient ID: [name], female    DOB: [birth year], 40 y.o.   MRN: [id number]  HPI HEADACHE: Onset: since 06/15/2011   Location: from temple to temple  Quality: throbbing Frequency: constant, 24/7 Precipitating factors: bright lights  Prior treatment: Fioricet from [facility] since 10/17 ,averaging 4X/ day. The headache has not changed since she began this medication. Associated Symptoms Nausea/vomiting: no  Phonophobia:  no Tearing of eyes: no  Sinus pain/pressure: no  PMH migraine: no  Red Flags Fever: no  Neck pain/stiffness: no  Vision/speech/swallow/hearing difficulty: yes, blurred vision  Focal weakness/numbness/tingling : no  Altered mental status: not able to concentrate &  grade papers on computer ; "I'm forgetting things" Trauma: yes, 10/14 she tripped over toy, struck head on hardwood floor  & had scalp laceration & LOC for 30 seconds. She fell again trying to get up with LOC for 2-3 minutes   New type of headache: yes.      CAT scan without contrast 10/17 was normal.     Review of Systems     Objective:   Physical Exam  Gen. appearance: Well-nourished, in no distress Eyes: Extraocular motion intact, field of vision normal, vision grossly intact, no nystagmus. Minimal ptosis OS ENT: Canals clear, tympanic membranes normal, tuning fork exam normal, hearing grossly normal Neck: Normal range of motion, no masses, normal thyroid. Neck is supple with no evidence of meningismus Cardiovascular: Rate and rhythm normal; no murmurs, gallops or extra heart sounds Muscle skeletal: Range of motion, tone, &  strength normal. Negative straight leg raising bilaterally to 90 Neuro:no cranial nerve deficit, deep tendon  reflexes normal, gait normal; gait normal; finger-nose and Romberg testing also normal Lymph: No cervical or axillary LA Skin: Warm and dry without suspicious lesions or rashes. Scalp wound well healed with no evidence of cellulitis Psych: no anxiety  or mood change. Normally interactive and cooperative.          Assessment & Plan:  #1 persistent postconcussive headache; no significant neurologic deficit on exam. I'm concerned about daily use of the Fioricet. She has no history of migraines but this type of medication is associated with chronic daily headache.  Plan: See orders and recommendations.

## 2011-06-30 ENCOUNTER — Ambulatory Visit: Payer: BC Managed Care – PPO | Admitting: Neurology

## 2011-07-02 ENCOUNTER — Other Ambulatory Visit: Payer: Self-pay | Admitting: Internal Medicine

## 2011-07-02 DIAGNOSIS — R51 Headache: Secondary | ICD-10-CM

## 2011-07-03 MED ORDER — TRAMADOL HCL 50 MG PO TABS: ORAL_TABLET | ORAL | Status: DC

## 2011-07-03 NOTE — Telephone Encounter (Signed)
OK # 30, 1/2 -1 every 6 hrs prn

## 2011-07-03 NOTE — Telephone Encounter (Signed)
Done

## 2011-07-15 ENCOUNTER — Ambulatory Visit: Payer: BC Managed Care – PPO | Admitting: Neurology

## 2012-01-04 ENCOUNTER — Encounter: Payer: Self-pay | Admitting: Internal Medicine

## 2012-01-04 ENCOUNTER — Ambulatory Visit (INDEPENDENT_AMBULATORY_CARE_PROVIDER_SITE_OTHER): Payer: BC Managed Care – PPO | Admitting: Internal Medicine

## 2012-01-04 VITALS — BP 98/62 | HR 91 | Temp 98.9°F | Wt 136.0 lb

## 2012-01-04 DIAGNOSIS — H109 Unspecified conjunctivitis: Secondary | ICD-10-CM

## 2012-01-04 DIAGNOSIS — T148XXA Other injury of unspecified body region, initial encounter: Secondary | ICD-10-CM

## 2012-01-04 DIAGNOSIS — R509 Fever, unspecified: Secondary | ICD-10-CM

## 2012-01-04 DIAGNOSIS — J209 Acute bronchitis, unspecified: Secondary | ICD-10-CM

## 2012-01-04 MED ORDER — AMOXICILLIN-POT CLAVULANATE 875-125 MG PO TABS: 1.0000 | ORAL_TABLET | Freq: Two times a day (BID) | ORAL | Status: AC

## 2012-01-04 MED ORDER — BUDESONIDE-FORMOTEROL FUMARATE 160-4.5 MCG/ACT IN AERO: 2.0000 | INHALATION_SPRAY | Freq: Two times a day (BID) | RESPIRATORY_TRACT | Status: DC

## 2012-01-04 MED ORDER — ERYTHROMYCIN 5 MG/GM OP OINT: TOPICAL_OINTMENT | OPHTHALMIC | Status: DC

## 2012-01-04 NOTE — Patient Instructions (Signed)
Order for x-rays entered into  the computer; these will be performed at [address] across from [facility]. No appointment is necessary. Please try to go on My Chart within the next 24 hours to allow me to release the results directly to you.  

## 2012-01-04 NOTE — Progress Notes (Signed)
  Subjective:    Patient ID: [name], female    DOB: [birth year], 41 y.o.   MRN: [id number]  HPI On 5/3 she awoke feeling feverish and nauseated. By 2 pm that day her right eye was noted to be a red and draining purulent material.This was associated with a sore throat and intermittent temperatures over 101. By the evening of 5/3 she noted some bruising of the upper lid medially.  Her 32-year-old son has pin worms at this time  On 5/4 she went to urgent care and was given eyedrops. She states that these do burn when used. She was also given an antibiotic, possible cephalexin.  She denies frontal sinus pain, facial pain, or nasal purulence at this time. Her sore throat is persisting as are the recurrent fevers.  She is producing yellow sputum; she denies hemoptysis.  She does smoke one pack per day. She has no history of asthma    Review of Systems She has not had epistaxis, hematuria, rectal bleeding, or melena. She's had no abnormal bruising or bleeding other than the medial upper lid.  She has not had significant rectal itching. She's also had no significant constipation or diarrhearer     Objective:   Physical Exam General appearance:good health ;well nourished; no acute distress or increased work of breathing is present.  No  lymphadenopathy about the head, neck, or axilla noted.   Eyes: Conjunctiva are both erythematous, right greater than left. No purulence is present. There is a serpiginous collection of blood in the right medial sclera. Extraocular motion and vision is intact. There is a regular faint bruising of the upper lid medially There is no scleral icterus.  Ears:  External ear exam shows no significant lesions or deformities.  Otoscopic examination reveals clear canals, tympanic membranes are intact bilaterally without bulging, retraction, inflammation or discharge.  Nose:  External nasal examination shows no deformity or inflammation. Nasal mucosa are pink and moist  without lesions or exudates. No septal dislocation or deviation.No obstruction to airflow. Small nasal jewelry on L  Oral exam: Dental hygiene is good; lips and gums are healthy appearing.There is no oropharyngeal erythema or exudate noted.   Neck:  No deformities, thyromegaly, masses, or tenderness noted.   Supple with full range of motion without pain.   Heart:  Normal rate and regular rhythm. S1 and S2 normal without gallop, murmur, click, rub S 4  Lungs: She exhibits scattered low-grade wheezing; this is greatest on the right especially right upper lobe.No increased work of breathing.  Bowel sounds are normal. Abdomen is soft and nontender with no organomegaly, hernias  or masses.    Extremities:  No cyanosis, edema, or clubbing  noted . Straight leg raising is negative with no sign of meningismus   Skin: Slightly clammy w/o jaundice or tenting.          Assessment & Plan:  #1 conjunctivitis with scleral bleeding; this has been partially treated as of this date. She has been exposed to pin worms  #2 bronchitis with asthmatic component  #3 ecchymosis and scleral bleeding  Plan: See orders and recommendations

## 2012-01-05 ENCOUNTER — Ambulatory Visit (INDEPENDENT_AMBULATORY_CARE_PROVIDER_SITE_OTHER)
Admission: RE | Admit: 2012-01-05 | Discharge: 2012-01-05 | Disposition: A | Discharge status: Home / Self Care | Payer: BC Managed Care – PPO | Source: Ambulatory Visit | Attending: Internal Medicine | Admitting: Internal Medicine

## 2012-01-05 ENCOUNTER — Telehealth: Payer: Self-pay | Admitting: Internal Medicine

## 2012-01-05 DIAGNOSIS — J209 Acute bronchitis, unspecified: Secondary | ICD-10-CM

## 2012-01-05 NOTE — Telephone Encounter (Signed)
Chest Xray Results  Patient called and stated she has 2-small children in her home and is concerned about spreading any infectious diseases to them. If anyway possible she would like the results ASAP  Patient ph# [phone]

## 2012-01-10 NOTE — Telephone Encounter (Signed)
Negative cxr

## 2012-01-11 NOTE — Telephone Encounter (Signed)
I spoke with patient, patient verbalized understanding that Chest Xray was normal and had no concerns at the time of call

## 2012-01-12 ENCOUNTER — Telehealth: Payer: Self-pay | Admitting: Internal Medicine

## 2012-01-12 NOTE — Telephone Encounter (Signed)
Message copied by Marshell Garfinkel on Tue Jan 12, 2012 11:50 AM ------      Message from: Pecola Lawless      Created: Tue Jan 12, 2012  6:01 AM       Please schedule CBC & dif , PT , PT/INR if bruising & eye changes have not resolved. Please verify she has seen Ophthalmologist. Thanks, Alfonse Flavors                         ----- Message -----         From: SYSTEM         Sent: 01/09/2012  12:02 AM           To: Pecola Lawless, MD

## 2012-01-12 NOTE — Telephone Encounter (Signed)
Lmovm for pt to return call.  

## 2012-01-12 NOTE — Progress Notes (Signed)
lmovm for pt to call the office.

## 2012-01-20 ENCOUNTER — Ambulatory Visit (INDEPENDENT_AMBULATORY_CARE_PROVIDER_SITE_OTHER): Payer: BC Managed Care – PPO | Admitting: Internal Medicine

## 2012-01-20 ENCOUNTER — Encounter: Payer: Self-pay | Admitting: Internal Medicine

## 2012-01-20 VITALS — BP 100/64 | HR 76 | Temp 98.8°F | Wt 137.8 lb

## 2012-01-20 DIAGNOSIS — H6981 Other specified disorders of Eustachian tube, right ear: Secondary | ICD-10-CM

## 2012-01-20 DIAGNOSIS — H9319 Tinnitus, unspecified ear: Secondary | ICD-10-CM

## 2012-01-20 MED ORDER — FLUTICASONE PROPIONATE 50 MCG/ACT NA SUSP: NASAL | Status: DC

## 2012-01-20 NOTE — Patient Instructions (Addendum)
Go to [facility] for eustachian tube dysfunction. Drink thin  fluids liberally through the day and chew sugarless gum . Do the Valsalva maneuver several times a day to "pop" ears open.  Plain Mucinex for thick secretions ;force NON dairy fluids . Use a Neti pot daily as needed for sinus congestion; going from open side to congested side . Nasal cleansing in the shower as discussed. Make sure that all residual soap is removed to prevent irritation. Fluticasone 1 spray in each nostril twice a day as needed. Use the "crossover" technique as discussed. Plain Allegra 160 daily as needed for itchy eyes & sneezing. Consider  [facility]'s smoking cessation program @ www.[email or web address] or (310) 409-2318.

## 2012-01-20 NOTE — Progress Notes (Signed)
  Subjective:    Patient ID: [name], female    DOB: 12-30-1970, 41 y.o.   MRN: [id number]  HPI Upper respiratory tract infection Onset/symptoms: 1 week ago as pressure in R ear with tinnitus Exposures (illness/environmental/extrinsic): 41 yo had ear infection Progression of symptoms:stable Treatments/response:no Rx  Smoking history:1 ppd           Review of Systems Fever/chills/sweats:no Frontal headache:no Facial pain:no Nasal purulence:no Sore throat:slight this am Dental pain:no Lymphadenopathy:no Wheezing/shortness of breath:no with Symbicort Cough/sputum/hemoptysis:no Associated extrinsic/allergic symptoms:itchy eyes/ sneezing:no       Objective:   Physical Exam General appearance:good health ;well nourished; no acute distress or increased work of breathing is present.  No  lymphadenopathy about the head, neck, or axilla noted.   Eyes: No conjunctival inflammation or lid edema is present.   Ears:  External ear exam shows no significant lesions or deformities.  Otoscopic examination reveals clear canals, tympanic membranes are intact bilaterally without bulging,  inflammation or discharge. There is a central retraction of the right tympanic membrane but the light reflex is excellent. Whisper is heard bilaterally at 6 feet. Air conduction is greater than bone conduction bilaterally. There is lateralization to the right with a tuning fork  Nose:  External nasal examination shows no deformity or inflammation. Nasal mucosa are pink and moist without lesions or exudates. No septal dislocation or deviation.No obstruction to airflow.   Oral exam: Dental hygiene is good; lips and gums are healthy appearing.There is no oropharyngeal erythema or exudate noted.     Heart:  Normal rate and regular rhythm. S1 and S2 normal without gallop, murmur, click, rub or other extra sounds.   Lungs: Low-grade scattered rhonchi and wheezes.No increased work of breathing.     Extremities:  No cyanosis, edema, or clubbing  noted    Skin: Warm & dry           Assessment & Plan:  #1 ear pressure with tinnitus. Clinically active infection is not suggested. Eustachian tube dysfunction is suggested. Problematic is that she will be flying within the next week.  Plan: See orders and recommendations. If symptoms fail to resolve with these interventions; I do recommend an Otologic evaluation the to the tympanic membrane changes and the tuning fork findings  Plan: See orders and recommendations

## 2012-01-26 ENCOUNTER — Ambulatory Visit: Payer: BC Managed Care – PPO | Admitting: Internal Medicine

## 2012-02-04 NOTE — Telephone Encounter (Signed)
LMOVM

## 2012-02-22 IMAGING — CT CT HEAD W/O CM
2 series · 16 of 30 positions shown, 18 images · non-contrast
Comparison: None.

CLINICAL DATA: Trauma, headache.

CT HEAD WITHOUT CONTRAST
TECHNIQUE: Contiguous axial images were obtained from the base of
the skull through the vertex without contrast.

[Series 2: head w/o · axial · non-contrast · 0.45mm/px · z∈[+34,+148]mm · 8 of 28 slices shown, 10 images]
[im 4/28  brain]
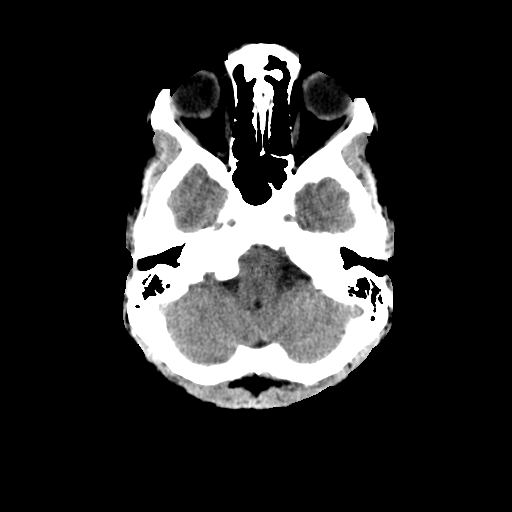
[im 4/28  bone]
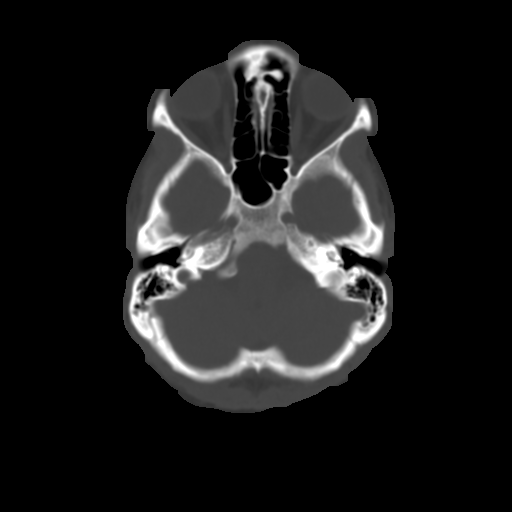
[im 7/28  brain]
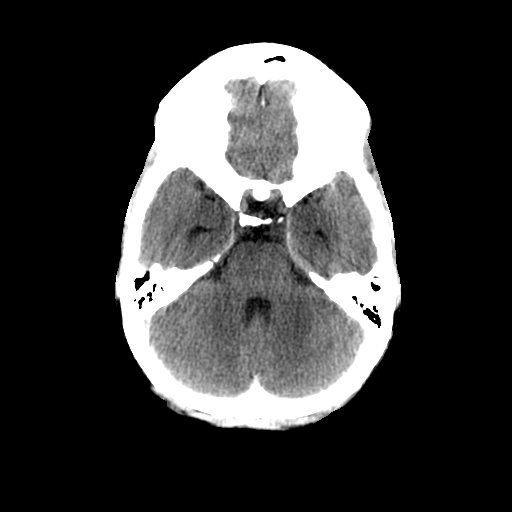
[im 10/28  brain]
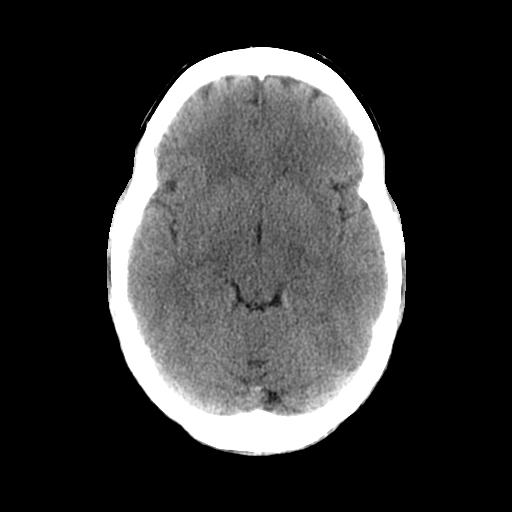
[im 13/28  brain]
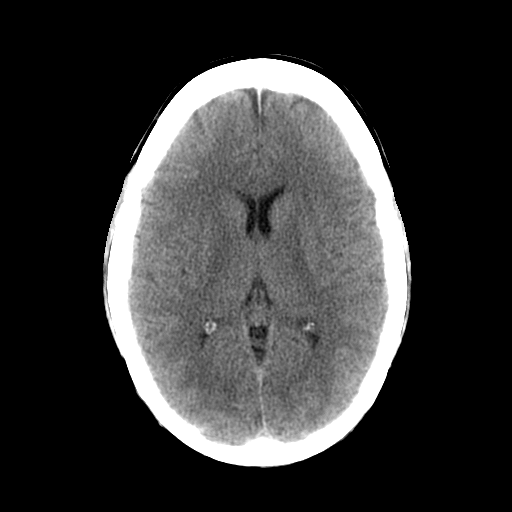
[im 16/28  brain]
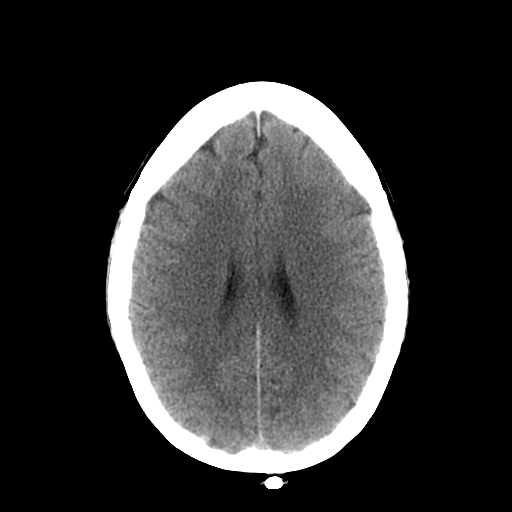
[im 16/28  bone]
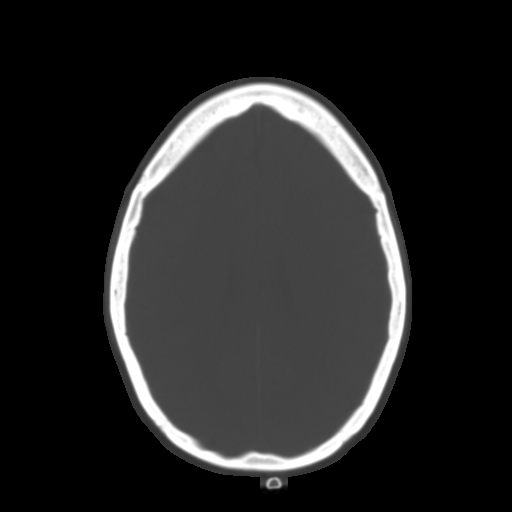
[im 19/28  brain]
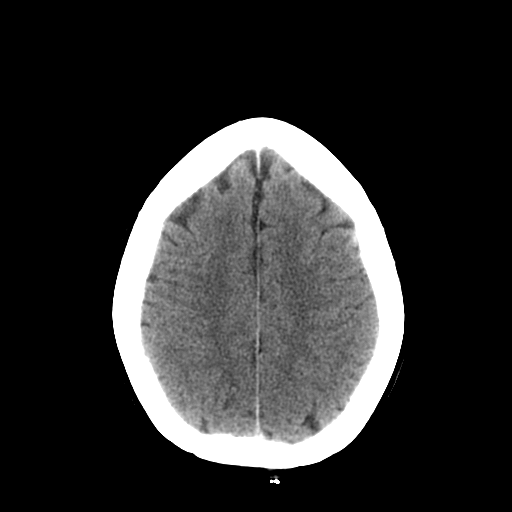
[im 22/28  brain]
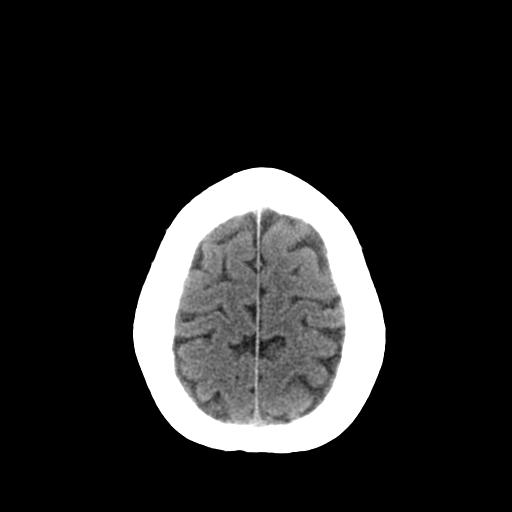
[im 25/28  brain]
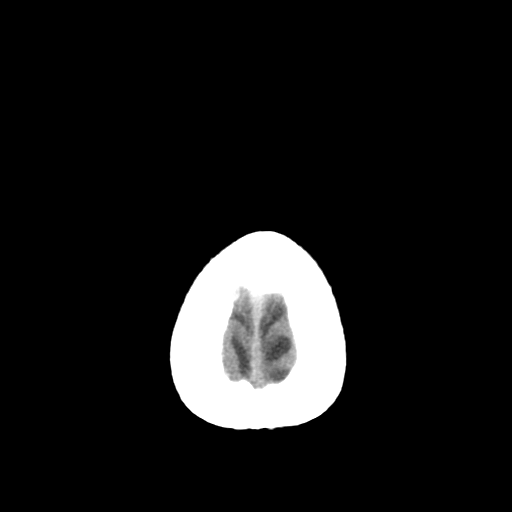

[Series 3: head bone · axial · 0.45mm/px · z∈[+30,+149]mm · 8 of 56 slices shown]
[im 6/56  bone]
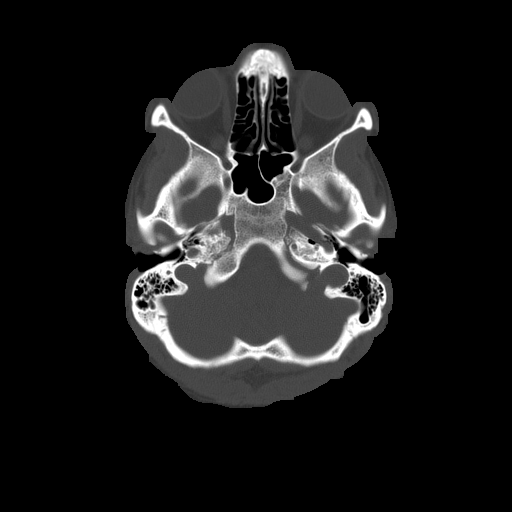
[im 12/56  bone]
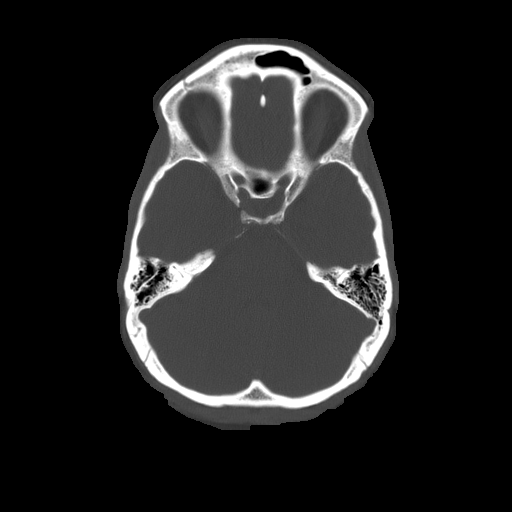
[im 18/56  bone]
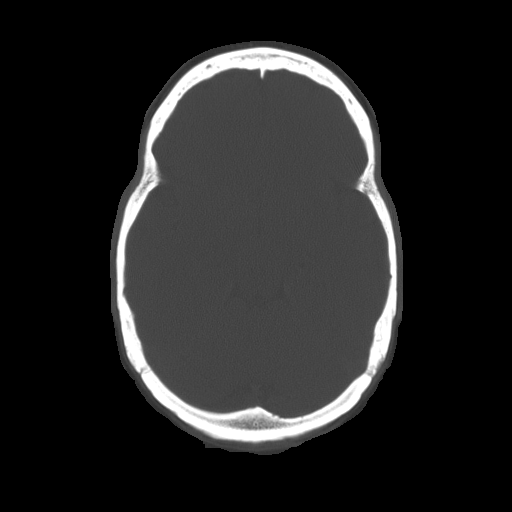
[im 24/56  bone]
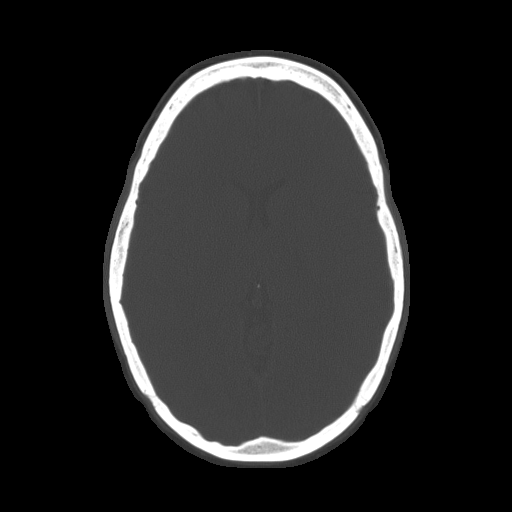
[im 32/56  bone]
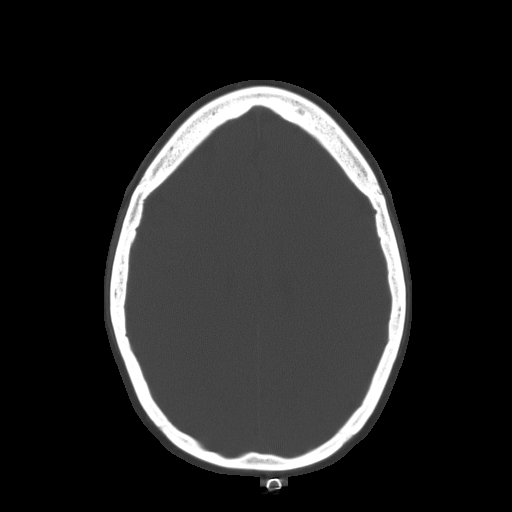
[im 38/56  bone]
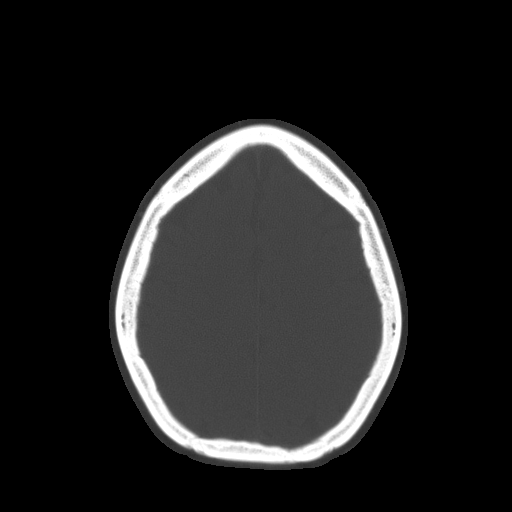
[im 44/56  bone]
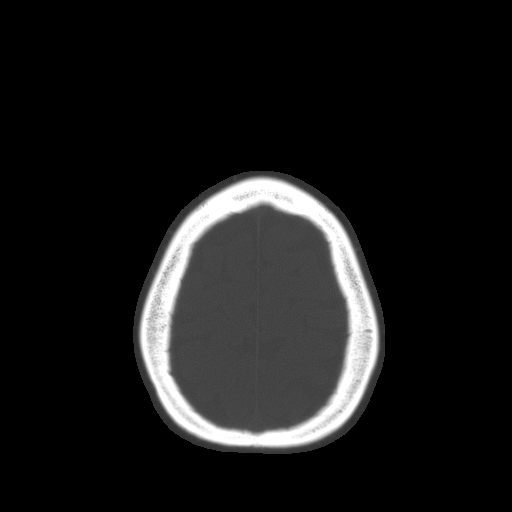
[im 50/56  bone]
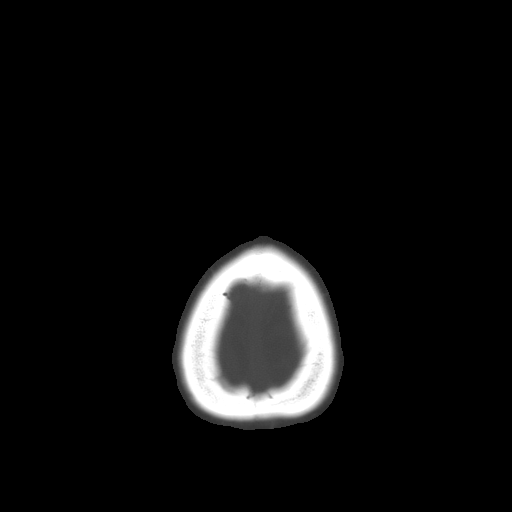

[16 of 30 positions shown; findings below may reference images not displayed]

FINDINGS: The brain appears normal without evidence of acute
infarction, hemorrhage, mass lesion, mass effect, midline shift or
abnormal extra-axial fluid collection.  No pneumocephalus or
hydrocephalus.  The calvarium is intact.  Imaged paranasal sinuses
and mastoid air cells are clear.
IMPRESSION: Normal study.

## 2012-02-25 ENCOUNTER — Other Ambulatory Visit: Payer: Self-pay | Admitting: Internal Medicine

## 2012-03-26 ENCOUNTER — Other Ambulatory Visit: Payer: Self-pay | Admitting: Internal Medicine

## 2012-05-12 ENCOUNTER — Ambulatory Visit (INDEPENDENT_AMBULATORY_CARE_PROVIDER_SITE_OTHER): Payer: BC Managed Care – PPO | Admitting: Internal Medicine

## 2012-05-12 ENCOUNTER — Encounter: Payer: Self-pay | Admitting: Internal Medicine

## 2012-05-12 VITALS — BP 128/80 | HR 95 | Temp 98.8°F | Wt 140.2 lb

## 2012-05-12 DIAGNOSIS — F17200 Nicotine dependence, unspecified, uncomplicated: Secondary | ICD-10-CM

## 2012-05-12 DIAGNOSIS — J449 Chronic obstructive pulmonary disease, unspecified: Secondary | ICD-10-CM

## 2012-05-12 DIAGNOSIS — R0601 Orthopnea: Secondary | ICD-10-CM

## 2012-05-12 DIAGNOSIS — F319 Bipolar disorder, unspecified: Secondary | ICD-10-CM

## 2012-05-12 DIAGNOSIS — E785 Hyperlipidemia, unspecified: Secondary | ICD-10-CM | POA: Insufficient documentation

## 2012-05-12 DIAGNOSIS — R42 Dizziness and giddiness: Secondary | ICD-10-CM

## 2012-05-12 DIAGNOSIS — R0602 Shortness of breath: Secondary | ICD-10-CM

## 2012-05-12 DIAGNOSIS — R61 Generalized hyperhidrosis: Secondary | ICD-10-CM

## 2012-05-12 DIAGNOSIS — J45909 Unspecified asthma, uncomplicated: Secondary | ICD-10-CM

## 2012-05-12 MED ORDER — BUDESONIDE-FORMOTEROL FUMARATE 160-4.5 MCG/ACT IN AERO: 2.0000 | INHALATION_SPRAY | Freq: Two times a day (BID) | RESPIRATORY_TRACT | Status: DC

## 2012-05-12 NOTE — Progress Notes (Signed)
  Subjective:    Patient ID: [name], female    DOB: 23-Jul-1971, 41 y.o.   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 These typically last hours & occur daily .  She describes the sensation of "heat rising" w/o definite flushing when exercising the last 2 days. Today she also had a nonspecific, dull headache over the upper temples and crown. There has been no constellation of headache, flushing, chest pain, and diarrhea.  She has not had these symptoms in the past going back to school. She is on Lamictil and Zyprexa from Dr. Andee Poles whom she saw last month. She typically sees Dr. Nolen Mu every 6 months and also as needed for any "crises". One such episode was a year ago which she related to "environmental stresses".   Review of Systems Cardiac prodrome: no palpitations, irregular rhythm, heart rate change Neurologic prodrome:no  numbness and tingling, weakness  Syncope:no Seizure activity:no Associated upper respiratory tract infection/extrinsic symptoms:no  Associated signs and symptoms: Visual change (blurred/double/loss):no Hearing loss/tinnitus:no Nausea:no Chest pain:no Treatment/response:none    She now smokes1 ppd      Objective:   Physical Exam  Gen. appearance: Well-nourished, in no distress Eyes: Extraocular motion intact, field of vision normal, vision grossly intact, no nystagmus ENT: Canals clear, tympanic membranes normal, tuning fork exam normal, hearing grossly normal Neck: Decreased range of motion, no masses, normal thyroid Cardiovascular: Rate and rhythm normal; no murmurs, gallops or extra heart sounds  Chest: No increased work of breathing but mild rhonchi noted the bases Muscle skeletal: Range of motion, tone, &  strength  normal Neuro:no cranial nerve deficit, deep tendon  reflexes normal, gait normal. Romberg and finger to nose testing normal Lymph: No cervical or axillary LA Skin: Warm and dry without suspicious lesions or rashes Psych: no anxiety or mood change. Normally interactive and cooperative. She exhibits some difficulty focusing on questions; answers sometimes not directly related to question         Assessment & Plan:  #1 Constellation of lightheadedness without specific trigger, orthopnea, and diaphoresis in context of exogenous stress.R/O bipolar exacerbation  #2 reactive airways disease with rhonchi in the context of continued smoking  #3, followed by Dr. Nolen Mu  Plan: Extensive labs will be checked. She asked about lipid status . Labs will include 24 urine for catecholamines and metanephrines. She will be asked to see Dr. Nolen Mu as soon as possible. Dr. Nolen Mu can determine whether a medical leave is needed

## 2012-05-12 NOTE — Patient Instructions (Addendum)
Review and correct the record as indicated. Please share record with all medical staff seen.   If you activate My Chart; the results can be released to you as soon as they populate from the lab. If you choose not to use this program; the labs have to be reviewed, copied & mailed   causing a delay in getting the results to you.  

## 2012-05-13 LAB — BASIC METABOLIC PANEL
CO2: 23 mEq/L (ref 19–32)
Calcium: 9.3 mg/dL (ref 8.4–10.5)
Chloride: 105 mEq/L (ref 96–112)
Sodium: 138 mEq/L (ref 135–145)

## 2012-05-13 LAB — LIPID PANEL
HDL: 64.9 mg/dL (ref >39.00)
Total CHOL/HDL Ratio: 3
Triglycerides: 144 mg/dL (ref 0.0–149.0)

## 2012-05-13 LAB — CBC WITH DIFFERENTIAL/PLATELET
Basophils Relative: 0.4 % (ref 0.0–3.0)
Eosinophils Absolute: 0.2 10*3/uL (ref 0.0–0.7)
Eosinophils Relative: 1.9 % (ref 0.0–5.0)
Hemoglobin: 14.4 g/dL (ref 12.0–15.0)
Lymphocytes Relative: 29.2 % (ref 12.0–46.0)
MCHC: 32.6 g/dL (ref 30.0–36.0)
MCV: 99.1 fl (ref 78.0–100.0)
Neutro Abs: 6.6 10*3/uL (ref 1.4–7.7)
RBC: 4.47 Mil/uL (ref 3.87–5.11)
WBC: 10.6 10*3/uL — ABNORMAL HIGH (ref 4.5–10.5)

## 2012-05-13 LAB — HEPATIC FUNCTION PANEL
ALT: 29 U/L (ref 0–35)
Bilirubin, Direct: 0.1 mg/dL (ref 0.0–0.3)
Total Bilirubin: 0.7 mg/dL (ref 0.3–1.2)

## 2012-05-13 LAB — LDL CHOLESTEROL, DIRECT: Direct LDL: 136.9 mg/dL

## 2012-05-13 LAB — T4, FREE: Free T4: 0.92 ng/dL (ref 0.60–1.60)

## 2012-05-13 LAB — TSH: TSH: 1.16 u[IU]/mL (ref 0.35–5.50)

## 2012-05-15 ENCOUNTER — Encounter: Payer: Self-pay | Admitting: Internal Medicine

## 2012-07-06 ENCOUNTER — Telehealth: Payer: Self-pay

## 2012-07-06 ENCOUNTER — Encounter (HOSPITAL_COMMUNITY): Payer: Self-pay | Admitting: Emergency Medicine

## 2012-07-06 ENCOUNTER — Emergency Department (HOSPITAL_COMMUNITY)
Admission: EM | Admit: 2012-07-06 | Discharge: 2012-07-06 | Disposition: A | Discharge status: Home / Self Care | Payer: BC Managed Care – PPO | Attending: Emergency Medicine | Admitting: Emergency Medicine

## 2012-07-06 DIAGNOSIS — F329 Major depressive disorder, single episode, unspecified: Secondary | ICD-10-CM | POA: Insufficient documentation

## 2012-07-06 DIAGNOSIS — Z79899 Other long term (current) drug therapy: Secondary | ICD-10-CM | POA: Insufficient documentation

## 2012-07-06 DIAGNOSIS — F3289 Other specified depressive episodes: Secondary | ICD-10-CM | POA: Insufficient documentation

## 2012-07-06 DIAGNOSIS — F17200 Nicotine dependence, unspecified, uncomplicated: Secondary | ICD-10-CM | POA: Insufficient documentation

## 2012-07-06 DIAGNOSIS — R55 Syncope and collapse: Secondary | ICD-10-CM

## 2012-07-06 LAB — URINALYSIS, ROUTINE W REFLEX MICROSCOPIC
Hgb urine dipstick: NEGATIVE
Nitrite: NEGATIVE
Protein, ur: NEGATIVE mg/dL
Specific Gravity, Urine: 1.01 (ref 1.005–1.030)
Urobilinogen, UA: 0.2 mg/dL (ref 0.0–1.0)

## 2012-07-06 LAB — CBC WITH DIFFERENTIAL/PLATELET
Basophils Absolute: 0 10*3/uL (ref 0.0–0.1)
Eosinophils Relative: 1 % (ref 0–5)
Lymphocytes Relative: 33 % (ref 12–46)
Lymphs Abs: 4.2 10*3/uL — ABNORMAL HIGH (ref 0.7–4.0)
MCV: 90.7 fL (ref 78.0–100.0)
Neutrophils Relative %: 58 % (ref 43–77)
Platelets: 274 10*3/uL (ref 150–400)
RBC: 4.53 MIL/uL (ref 3.87–5.11)
RDW: 12.6 % (ref 11.5–15.5)
WBC: 12.9 10*3/uL — ABNORMAL HIGH (ref 4.0–10.5)

## 2012-07-06 LAB — COMPREHENSIVE METABOLIC PANEL
ALT: 18 U/L (ref 0–35)
AST: 18 U/L (ref 0–37)
Alkaline Phosphatase: 49 U/L (ref 39–117)
CO2: 26 mEq/L (ref 19–32)
Calcium: 9.8 mg/dL (ref 8.4–10.5)
GFR calc non Af Amer: 61 mL/min — ABNORMAL LOW (ref >90)
Glucose, Bld: 94 mg/dL (ref 70–99)
Potassium: 3.7 mEq/L (ref 3.5–5.1)
Sodium: 137 mEq/L (ref 135–145)
Total Protein: 7.1 g/dL (ref 6.0–8.3)

## 2012-07-06 LAB — URINE MICROSCOPIC-ADD ON

## 2012-07-06 NOTE — Telephone Encounter (Signed)
Patient calling, has had dizzness.  Last night she went to open the refrigerator and had dizziness and weakness and walked over to her husband and asked him to hold her.  He had to lower her to the floor.  I lost the call, called x 2 and left her a message.  Set up a call back for 30 minutes.  12:38 Called and spoke with [name].  She had gotten a message from MD to go to the ED for evaluation.  Her mom is there and asking if she should have a neurtology referral.  Advised to go to the ED first for initial evaluation.  She voiced an understanding.

## 2012-07-06 NOTE — ED Provider Notes (Signed)
History     CSN: [id number]  Arrival date & time 07/06/12  1337   First MD Initiated Contact with Patient 07/06/12 1722      CC: syncope  (Consider location/radiation/quality/duration/timing/severity/associated sxs/prior treatment) HPI Patient states that yesterday evening she was standing in the kitchen with her husband when she began to sweat and having feelings of flushing. She states that she then began to feel lightheaded, and held on to her husband before slowly lowering herself to the ground and briefly losing consciousness. She did not have any seizure-like activity. She states that after awakening she was sweating profusely for ~10 minutes, after which she began to feel better and had no subsequent issues. Pt has had no issues today either, but decided to be evaluated in the ED regarding the episode yesterday evening. She admits that she has been very busy and stressed, and has not been eating as regularly as usual, and agrees that this seems to have been the trigger of the episode. She has a history of presyncopal events associated with similar circumstances, but denies any true syncopal events similar to the one yesterday. Pt has bipolar disorder, but takes medication regularly, with no manic episodes in ~13 years. Pt's Mom is present and espouses this history as well, and agrees that patient always has a high but stable level of energy and is just very active.    Past Medical History  Diagnosis Date  . Depression     bipolar  . Depression     Past Surgical History  Procedure Date  . Cesarean section     x2  . G2 p2   . Ankle surgery     Family History  Problem Relation Age of Onset  . Diabetes Father     2  . Cancer Maternal Grandmother     pancreatic  . Hypertension Maternal Grandfather   . Heart disease Maternal Grandfather     History  Substance Use Topics  . Smoking status: Current Every Day Smoker -- 1.0 packs/day  . Smokeless tobacco: Not on file  .  Alcohol Use: No    OB History    Grav Para Term Preterm Abortions TAB SAB Ect Mult Living                  Review of Systems  All other systems reviewed and are negative.    Allergies  Review of patient's allergies indicates no known allergies.  Home Medications   Current Outpatient Rx  Name  Route  Sig  Dispense  Refill  . HYDROXYZINE HCL 50 MG PO TABS   Oral   Take 50 mg by mouth every 6 (six) hours as needed. For anxiety         . LAMOTRIGINE ER 300 MG PO TB24   Oral   Take by mouth daily.           Marland Kitchen NORGESTIM-ETH ESTRAD TRIPHASIC 0.18/0.215/0.25 MG-35 MCG PO TABS   Oral   Take 1 tablet by mouth daily.           Marland Kitchen OLANZAPINE 10 MG PO TABS   Oral   Take 10 mg by mouth at bedtime.         Marland Kitchen VALACYCLOVIR HCL 500 MG PO TABS   Oral   Take 500 mg by mouth daily.             BP 137/70  Pulse 74  Temp 98.5 F (36.9 C) (Oral)  Resp 23  SpO2  100%  Physical Exam  Constitutional: She is oriented to person, place, and time. She appears well-developed and well-nourished. No distress.  HENT:  Head: Normocephalic and atraumatic.  Eyes: Pupils are equal, round, and reactive to light.  Cardiovascular: Normal rate and regular rhythm.   No murmur heard. Pulmonary/Chest: Effort normal. She has no wheezes. She has no rales.  Abdominal: Soft. She exhibits no distension. There is no tenderness.  Neurological: She is alert and oriented to person, place, and time. No cranial nerve deficit.  Skin: Skin is warm and dry. No rash noted.  Psychiatric: She has a normal mood and affect. Her behavior is normal.    ED Course  Procedures (including critical care time)  Labs Reviewed  URINALYSIS, ROUTINE W REFLEX MICROSCOPIC - Abnormal; Notable for the following:    Leukocytes, UA TRACE (*)     All other components within normal limits  CBC WITH DIFFERENTIAL - Abnormal; Notable for the following:    WBC 12.9 (*)     Lymphs Abs 4.2 (*)     All other components within  normal limits  COMPREHENSIVE METABOLIC PANEL - Abnormal; Notable for the following:    Creatinine, Ser 1.12 (*)     GFR calc non Af Amer 61 (*)     GFR calc Af Amer 71 (*)     All other components within normal limits  POCT PREGNANCY, URINE  URINE MICROSCOPIC-ADD ON    EKG: normal EKG. Rate = 60bpm, normal sinus rhythm, unchanged from previous tracings.   1. Vasovagal syncope        MDM  Patient counseled briefly on smoking cessation and also on importance of eating/drinking regularly despite maintaining a busy schedule. Pt will follow-up with her PCP.         Elfredia Nevins, MD 07/06/12 1807

## 2012-07-06 NOTE — ED Notes (Signed)
Last night felt lightheaded got weak sat on floor  She black out  Her husband states  Sweated a lot after   Went to bed and  Nothing like that has happened today

## 2012-07-06 NOTE — Telephone Encounter (Signed)
Called pt back to advise to go to ER for evaluation and testing and let them know your head injury Hx. Pt stated understanding and will keep [place] posted on results/ Dx.   MW

## 2012-07-06 NOTE — ED Provider Notes (Signed)
I saw and evaluated the patient, reviewed the resident's note and I agree with the findings and plan.   .Face to face Exam:  General:  Awake HEENT:  Atraumatic Resp:  Normal effort Abd:  Nondistended Neuro:No focal weakness Lymph: No adenopathy   Nelia Shi, MD 07/06/12 1818

## 2012-07-06 NOTE — ED Notes (Signed)
Pt states falling 1 year ago and having head injury and pt. Has had dizziness intermittently since. Last night pt was walking to refrigerator and begun to feel weakness in legs. Pt. Husband helped pt. To ground and pt soon after passed out and woke up diaphoretic. Pt. Husband states pt. "turned completely white and eyes turned back in head"

## 2012-07-06 NOTE — Telephone Encounter (Signed)
Noted, Dr.Hopper verbally informed of this situation

## 2012-07-06 NOTE — Telephone Encounter (Signed)
She'll be seen in followup and further evaluation  as indicated by findings in ER. Symptoms may relate to a postconcussion phenomena.

## 2012-07-06 NOTE — Telephone Encounter (Signed)
Pt was hung up on twice trying to speak to Caller Nurse so I was given pt call:  Pt states 75yr ago fell busted head open went to UC got staples since then suffers from dizziness.  Pt also states last night started feeling light headed husband had to help her to the ground because pt was passing out.  Husband stated she changed color, passed out and broke out in sweat. Pt husband thought she was dying.  Pt is asking what she should do? Plz advise   MW  CB: [phone]

## 2012-10-15 ENCOUNTER — Other Ambulatory Visit: Payer: Self-pay

## 2013-03-02 ENCOUNTER — Ambulatory Visit (INDEPENDENT_AMBULATORY_CARE_PROVIDER_SITE_OTHER): Payer: BC Managed Care – PPO | Admitting: Emergency Medicine

## 2013-03-02 VITALS — BP 110/60 | HR 69 | Temp 98.3°F | Resp 18 | Ht 63.5 in | Wt 141.0 lb

## 2013-03-02 DIAGNOSIS — B779 Ascariasis, unspecified: Secondary | ICD-10-CM

## 2013-03-02 MED ORDER — ALBENDAZOLE 200 MG PO TABS: 400.0000 mg | ORAL_TABLET | Freq: Two times a day (BID) | ORAL | Status: DC

## 2013-03-02 NOTE — Patient Instructions (Addendum)
Intestinal Parasites (Worms)  Your exam shows the presence of intestinal worms. Roundworms and tapeworms are common and cause a variety of symptoms: stomach cramps, diarrhea, nausea, passage of worms in the stool, and allergic reactions. Roundworms can cause hives, asthma, pneumonia, and blockage of the bowels.   Tapeworms usually come from undercooked meat (beef, pork, and fish). Roundworms are picked up through the soil. It is very important that you take the medicine you have been prescribed to destroy these parasites. It is also very important that you see your doctor in follow-up to evaluate the results of your treatment. Sometimes a second course of medicine is needed to get rid of the parasite worms.  Document Released: 08/17/2005 Document Revised: 11/09/2011 Document Reviewed: 02/07/2007  [employer] Patient Information 2014 [employer].

## 2013-03-02 NOTE — Progress Notes (Signed)
[facility] [address] [place] [address] [phone]  Date:  03/02/2013   Name:  [name]   DOB:  08-16-1971   MRN:  [id number]  PCP:  Marga Melnick, MD    Chief Complaint: pinworms noticed yesterday in stool   History of Present Illness:  [name] is a 42 y.o. very pleasant female patient who presents with the following:  Travelled to [place] and returned 6/18.  Visited [place] and [place].  Noticed roundworms in her stool yesterday.  Is otherwise no GI or GU complaints.  No improvement with over the counter medications or other home remedies. Denies other complaint or health concern today.   Patient Active Problem List   Diagnosis Date Noted  . Other and unspecified hyperlipidemia 05/12/2012  . GESTATIONAL DIABETES 10/13/2010  . FATIGUE 10/13/2010  . BIPOLAR DISORDER UNSPECIFIED 08/29/2010  . CIGARETTE SMOKER 08/29/2010  . CHRONIC AIRWAY OBSTRUCTION NEC 08/29/2010  . 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 Depression     bipolar  . Depression     Past Surgical History  Procedure Laterality Date  . Cesarean section      x2  . G2 p2    . Ankle surgery      History  Substance Use Topics  . Smoking status: Current Every Day Smoker -- 1.00 packs/day  . Smokeless tobacco: Not on file  . Alcohol Use: No    Family History  Problem Relation Age of Onset  . Diabetes Father     2  . Cancer Maternal Grandmother     pancreatic  . Hypertension Maternal Grandfather   . Heart disease Maternal Grandfather     No Known Allergies  Medication list has been reviewed and updated.  Current Outpatient Prescriptions on File Prior to Visit  Medication Sig Dispense Refill  . LamoTRIgine (LAMICTAL XR) 300 MG TB24 Take by mouth daily.        Marland Kitchen OLANZapine (ZYPREXA) 10 MG tablet Take 10 mg by mouth at bedtime.      . valACYclovir (VALTREX) 500 MG tablet Take 500 mg by mouth daily.         . hydrOXYzine (ATARAX/VISTARIL) 50 MG tablet Take 50 mg by mouth every 6 (six) hours as needed. For anxiety      . Norgestimate-Ethinyl Estradiol Triphasic (TRI-SPRINTEC) 0.18/0.215/0.25 MG-35 MCG tablet Take 1 tablet by mouth daily.         No current facility-administered medications on file prior to visit.    Review of Systems:  As per HPI, otherwise negative.    Physical Examination: Filed Vitals:   03/02/13 1816  BP: 110/60  Pulse: 69  Temp: 98.3 F (36.8 C)  Resp: 18   Filed Vitals:   03/02/13 1816  Height: 5' 3.5" (1.613 m)  Weight: 141 lb (63.957 kg)   Body mass index is 24.58 kg/(m^2). Ideal Body Weight: Weight in (lb) to have BMI = 25: 143.1   GEN: WDWN, NAD, Non-toxic, Alert & Oriented x 3 HEENT: Atraumatic, Normocephalic.  Ears and Nose: No external deformity. EXTR: No clubbing/cyanosis/edema NEURO: Normal gait.  PSYCH: Normally interactive. Conversant. Not depressed or anxious appearing.  Calm demeanor.  ABDOMEN:  Normal benign soft  Assessment and Plan: Roundworms by history Albendazole   Signed,  Phillips Odor, MD

## 2013-06-21 ENCOUNTER — Other Ambulatory Visit: Payer: Self-pay | Admitting: Internal Medicine

## 2013-06-21 DIAGNOSIS — D72829 Elevated white blood cell count, unspecified: Secondary | ICD-10-CM

## 2013-06-21 DIAGNOSIS — N289 Disorder of kidney and ureter, unspecified: Secondary | ICD-10-CM | POA: Insufficient documentation

## 2013-06-21 DIAGNOSIS — E785 Hyperlipidemia, unspecified: Secondary | ICD-10-CM

## 2013-06-21 NOTE — Telephone Encounter (Signed)
Please advise 

## 2013-06-21 NOTE — Telephone Encounter (Signed)
#  30; I'll enter labs

## 2013-06-21 NOTE — Telephone Encounter (Signed)
06/21/2013  Pt called, states she she needs labs, there are no future orders listed.  She also request refill on traMADol (ULTRAM) 50 MG tablet.  Last time this was prescribed was 07/03/2011.  Last OV with [facility] 05/12/2012.   Please advise. bw

## 2013-06-23 NOTE — Telephone Encounter (Signed)
Please verify Rx sent; thanks, Hopp ( I'msending it to you to guarantee it gets done)

## 2013-06-23 NOTE — Telephone Encounter (Signed)
Med was faxed yesterday.

## 2013-07-06 ENCOUNTER — Other Ambulatory Visit: Payer: Self-pay

## 2013-07-11 ENCOUNTER — Ambulatory Visit: Payer: BC Managed Care – PPO | Admitting: Internal Medicine

## 2013-11-20 ENCOUNTER — Emergency Department (HOSPITAL_COMMUNITY)
Admission: EM | Admit: 2013-11-20 | Discharge: 2013-11-20 | Disposition: A | Discharge status: Home / Self Care | Payer: BC Managed Care – PPO | Attending: Emergency Medicine | Admitting: Emergency Medicine

## 2013-11-20 ENCOUNTER — Encounter (HOSPITAL_COMMUNITY): Payer: Self-pay | Admitting: Emergency Medicine

## 2013-11-20 DIAGNOSIS — Z79899 Other long term (current) drug therapy: Secondary | ICD-10-CM | POA: Insufficient documentation

## 2013-11-20 DIAGNOSIS — O209 Hemorrhage in early pregnancy, unspecified: Secondary | ICD-10-CM | POA: Insufficient documentation

## 2013-11-20 DIAGNOSIS — R209 Unspecified disturbances of skin sensation: Secondary | ICD-10-CM | POA: Insufficient documentation

## 2013-11-20 DIAGNOSIS — F4321 Adjustment disorder with depressed mood: Secondary | ICD-10-CM | POA: Insufficient documentation

## 2013-11-20 DIAGNOSIS — F4320 Adjustment disorder, unspecified: Secondary | ICD-10-CM

## 2013-11-20 DIAGNOSIS — O99340 Other mental disorders complicating pregnancy, unspecified trimester: Secondary | ICD-10-CM | POA: Insufficient documentation

## 2013-11-20 DIAGNOSIS — R42 Dizziness and giddiness: Secondary | ICD-10-CM | POA: Insufficient documentation

## 2013-11-20 DIAGNOSIS — F411 Generalized anxiety disorder: Secondary | ICD-10-CM | POA: Insufficient documentation

## 2013-11-20 DIAGNOSIS — F319 Bipolar disorder, unspecified: Secondary | ICD-10-CM | POA: Insufficient documentation

## 2013-11-20 DIAGNOSIS — O9989 Other specified diseases and conditions complicating pregnancy, childbirth and the puerperium: Secondary | ICD-10-CM | POA: Insufficient documentation

## 2013-11-20 DIAGNOSIS — O99330 Smoking (tobacco) complicating pregnancy, unspecified trimester: Secondary | ICD-10-CM | POA: Insufficient documentation

## 2013-11-20 NOTE — ED Notes (Signed)
Pt reports miscarriage on Saturday. Reports waking this AM just not feeling normal. Reports dizziness with standing, states legs feel like spaghetti while walking. States left arm was noted to be numb this AM after waking. Denies vaginal bleeding. Pt awake, alert, VSS, NAD at this time. Moves all extremites equally, no slurred speech, no droop or drift noted.

## 2013-11-20 NOTE — Discharge Instructions (Signed)
Grief Reaction Grief is a normal response to the death of someone close to you. Feelings of fear, anger, and guilt can affect almost everyone who loses someone they love. Symptoms of depression are also common. These include problems with sleep, loss of appetite, and lack of energy. These grief reaction symptoms often last for weeks to months after a loss. They may also return during special times that remind you of the person you lost, such as an anniversary or birthday. Anxiety, insomnia, irritability, and deep depression may last beyond the period of normal grief. If you experience these feelings for 6 months or longer, you may have clinical depression. Clinical depression requires further medical attention. If you think that you have clinical depression, you should contact your caregiver. If you have a history of depression and or a family history of depression, you are at greater risk of clinical depression. You are also at greater risk of developing clinical depression if the loss was traumatic or the loss was of someone with whom you had unresolved issues.  A grief reaction can become complicated by being blocked. This means being unable to cry or express extreme emotions. This may prolong the grieving period and worsen the emotional effects of the loss. Mourning is a natural event in human life. A healthy grief reaction is one that is not blocked . It requires a time of sadness and readjustment.It is very important to share your sorrow and fear with others, especially close friends and family. Professional counselors and clergy can also help you process your grief. Document Released: 08/17/2005 Document Revised: 11/09/2011 Document Reviewed: 04/27/2006 [facility] Patient Information 2014 [place], [place].     Please go to [facility] and be seen by Dr. Alwyn Pea

## 2013-11-20 NOTE — ED Provider Notes (Signed)
CSN: [id number]     Arrival date & time 11/20/13  0903 History   First MD Initiated Contact with Patient 11/20/13 0920     Chief Complaint  Patient presents with  . Miscarriage     (Consider location/radiation/quality/duration/timing/severity/associated sxs/prior Treatment) HPI Comments: Pt is G80P2 and was [redacted]w[redacted]d as of Saturday when she developed some abd cramping followed by heavy bleeding and likely miscarriage while in the bathroom at home.  Bleeding was heavy but only for about 30 minutes, none since then.  She called OB/GYN office and had a call back, but no instructions.  This AM upon waking with alarm, her heart was racing, she has been emotional, and felt numb esp on left side, intermittent dizziness.  No weakness, no HA.  She did have coffee this AM, but none of her medications.  She has a h/o depression and Bipolar disorder.  She is quite upset at losing her baby.  She admits she was alone and couldn't get in touch with her husband when the miscarriage occurred on Saturday.  They have a church friend that had retained products of conception which concerned them.  Pt denies abd pain, back pain, cramping, vaginal bleeding, discharge, fevers, chills.    Patient is a 43 y.o. female presenting with anxiety. The history is provided by the patient, the spouse and medical records.  Anxiety This is a new problem. The current episode started 3 to 5 hours ago. The problem occurs constantly. Pertinent negatives include no chest pain, no abdominal pain, no headaches and no shortness of breath.    Past Medical History  Diagnosis Date  . Depression     bipolar  . Depression   . Bipolar 1 disorder    Past Surgical History  Procedure Laterality Date  . Cesarean section      x2  . G2 p2    . Ankle surgery     Family History  Problem Relation Age of Onset  . Diabetes Father     2  . Cancer Maternal Grandmother     pancreatic  . Hypertension Maternal Grandfather   . Heart disease Maternal  Grandfather    History  Substance Use Topics  . Smoking status: Current Every Day Smoker -- 1.00 packs/day  . Smokeless tobacco: Not on file  . Alcohol Use: No   OB History   Grav Para Term Preterm Abortions TAB SAB Ect Mult Living                 Review of Systems  Constitutional: Negative for fever and chills.  Respiratory: Negative for cough and shortness of breath.   Cardiovascular: Negative for chest pain.  Gastrointestinal: Negative for nausea, vomiting, abdominal pain and blood in stool.  Genitourinary: Positive for vaginal bleeding. Negative for vaginal discharge.  Musculoskeletal: Negative for back pain.  Neurological: Positive for dizziness and numbness. Negative for syncope, speech difficulty, weakness and headaches.  Psychiatric/Behavioral: The patient is nervous/anxious.   All other systems reviewed and are negative.      Allergies  Review of patient's allergies indicates no known allergies.  Home Medications   Current Outpatient Rx  Name  Route  Sig  Dispense  Refill  . hydrOXYzine (ATARAX/VISTARIL) 50 MG tablet   Oral   Take 50 mg by mouth every 6 (six) hours as needed. For anxiety         . LamoTRIgine (LAMICTAL XR) 300 MG TB24   Oral   Take 300 mg by mouth daily.          Marland Kitchen  OLANZapine (ZYPREXA) 15 MG tablet   Oral   Take 15 mg by mouth at bedtime.         . Prenatal Vit-Fe Fumarate-FA (PRENATAL MULTIVITAMIN) TABS tablet   Oral   Take 1 tablet by mouth daily at 12 noon.          BP 124/75  Pulse 79  Temp(Src) 98.8 F (37.1 C) (Oral)  Resp 18  Wt 134 lb 12.8 oz (61.145 kg)  SpO2 100%  LMP 11/18/2013 Physical Exam  Nursing note and vitals reviewed. Constitutional: She is oriented to person, place, and time. She appears well-developed and well-nourished. No distress.  HENT:  Head: Normocephalic and atraumatic.  Eyes: EOM are normal.  Neck: Normal range of motion. Neck supple.  Cardiovascular: Normal rate, regular rhythm and  intact distal pulses.   Pulmonary/Chest: Effort normal and breath sounds normal. She has no wheezes.  Abdominal: Soft. She exhibits no distension. There is no tenderness. There is no rebound and no guarding.  Neurological: She is oriented to person, place, and time.  Skin: Skin is warm.  Psychiatric: Judgment and thought content normal. Her mood appears anxious. Her affect is labile. Her affect is not angry, not blunt and not inappropriate. Her speech is not rapid and/or pressured, not delayed and not tangential. She is not agitated, not aggressive, not hyperactive, not slowed, not withdrawn, not actively hallucinating and not combative. Cognition and memory are normal.  Pt has been intermittently tearful and upset during history and exam She is attentive.    ED Course  Procedures (including critical care time) Labs Review Labs Reviewed - No data to display Imaging Review No results found.   EKG Interpretation None      RA sat is 100% and I interpret to be normal  10:14 AM Discussed with Dr. Alwyn Pea who can see pt in the office now.  MDM   Final diagnoses:  Grief reaction    Pt seems anxious, early grieving period.  Pt doesn't appear ill, no report of continued vaginal bleeding, no N/V/D.  Abd is non tender.  Will discuss with Dr. Ouida Sills if available in clinic.  I suspect pt had anxiety attack this AM, seems improved now, appropriate grief level at this point.  VS are normal.  Exam is otherwise normal, no focal neuro deficits.  No CP.      Saddie Benders. Khalin Royce, MD 11/20/13 1015

## 2013-11-30 LAB — OB RESULTS CONSOLE RPR: RPR: NONREACTIVE

## 2013-11-30 LAB — OB RESULTS CONSOLE RUBELLA ANTIBODY, IGM: RUBELLA: IMMUNE

## 2013-11-30 LAB — OB RESULTS CONSOLE HIV ANTIBODY (ROUTINE TESTING): HIV: NONREACTIVE

## 2013-11-30 LAB — OB RESULTS CONSOLE GC/CHLAMYDIA
CHLAMYDIA, DNA PROBE: NEGATIVE
Gonorrhea: NEGATIVE

## 2013-11-30 LAB — OB RESULTS CONSOLE ABO/RH: RH TYPE: POSITIVE

## 2013-11-30 LAB — OB RESULTS CONSOLE ANTIBODY SCREEN: Antibody Screen: NEGATIVE

## 2013-11-30 LAB — OB RESULTS CONSOLE HEPATITIS B SURFACE ANTIGEN: Hepatitis B Surface Ag: NEGATIVE

## 2014-06-07 LAB — OB RESULTS CONSOLE GBS: GBS: NEGATIVE

## 2014-06-25 ENCOUNTER — Telehealth (HOSPITAL_COMMUNITY): Payer: Self-pay | Admitting: *Deleted

## 2014-06-26 ENCOUNTER — Encounter (HOSPITAL_COMMUNITY): Payer: Self-pay | Admitting: *Deleted

## 2014-06-26 NOTE — Telephone Encounter (Signed)
Preadmission screen  

## 2014-06-28 ENCOUNTER — Inpatient Hospital Stay (HOSPITAL_COMMUNITY): Payer: BC Managed Care – PPO | Admitting: Anesthesiology

## 2014-06-28 ENCOUNTER — Inpatient Hospital Stay (HOSPITAL_COMMUNITY): Admission: RE | Admit: 2014-06-28 | Payer: BC Managed Care – PPO | Source: Ambulatory Visit

## 2014-06-28 ENCOUNTER — Encounter (HOSPITAL_COMMUNITY)
Admission: AD | Disposition: A | Discharge status: Home / Self Care | Payer: Self-pay | Source: Ambulatory Visit | Attending: Obstetrics and Gynecology

## 2014-06-28 ENCOUNTER — Inpatient Hospital Stay (HOSPITAL_COMMUNITY)
Admission: AD | Admit: 2014-06-28 | Discharge: 2014-06-30 | DRG: 765 | Disposition: A | Discharge status: Home / Self Care | Payer: BC Managed Care – PPO | Source: Ambulatory Visit | Attending: Obstetrics and Gynecology | Admitting: Obstetrics and Gynecology

## 2014-06-28 ENCOUNTER — Encounter (HOSPITAL_COMMUNITY): Payer: BC Managed Care – PPO | Admitting: Anesthesiology

## 2014-06-28 ENCOUNTER — Encounter (HOSPITAL_COMMUNITY): Payer: Self-pay | Admitting: *Deleted

## 2014-06-28 DIAGNOSIS — S3720XA Unspecified injury of bladder, initial encounter: Secondary | ICD-10-CM

## 2014-06-28 DIAGNOSIS — O09523 Supervision of elderly multigravida, third trimester: Secondary | ICD-10-CM | POA: Diagnosis not present

## 2014-06-28 DIAGNOSIS — A6000 Herpesviral infection of urogenital system, unspecified: Secondary | ICD-10-CM | POA: Diagnosis present

## 2014-06-28 DIAGNOSIS — Z3A39 39 weeks gestation of pregnancy: Secondary | ICD-10-CM | POA: Diagnosis present

## 2014-06-28 DIAGNOSIS — O9989 Other specified diseases and conditions complicating pregnancy, childbirth and the puerperium: Secondary | ICD-10-CM | POA: Diagnosis present

## 2014-06-28 DIAGNOSIS — A6004 Herpesviral vulvovaginitis: Secondary | ICD-10-CM | POA: Diagnosis present

## 2014-06-28 DIAGNOSIS — O3421 Maternal care for scar from previous cesarean delivery: Principal | ICD-10-CM | POA: Diagnosis present

## 2014-06-28 DIAGNOSIS — N736 Female pelvic peritoneal adhesions (postinfective): Secondary | ICD-10-CM | POA: Diagnosis present

## 2014-06-28 DIAGNOSIS — F319 Bipolar disorder, unspecified: Secondary | ICD-10-CM | POA: Diagnosis present

## 2014-06-28 DIAGNOSIS — O99343 Other mental disorders complicating pregnancy, third trimester: Secondary | ICD-10-CM | POA: Diagnosis present

## 2014-06-28 DIAGNOSIS — O9832 Other infections with a predominantly sexual mode of transmission complicating childbirth: Secondary | ICD-10-CM | POA: Diagnosis present

## 2014-06-28 DIAGNOSIS — O34219 Maternal care for unspecified type scar from previous cesarean delivery: Secondary | ICD-10-CM

## 2014-06-28 DIAGNOSIS — Z3490 Encounter for supervision of normal pregnancy, unspecified, unspecified trimester: Secondary | ICD-10-CM

## 2014-06-28 HISTORY — DX: Gestational diabetes mellitus in pregnancy, unspecified control: O24.419

## 2014-06-28 HISTORY — DX: Bipolar disorder, unspecified: F31.9

## 2014-06-28 LAB — CBC
HCT: 34.1 % — ABNORMAL LOW (ref 36.0–46.0)
Hemoglobin: 11.8 g/dL — ABNORMAL LOW (ref 12.0–15.0)
MCH: 31 pg (ref 26.0–34.0)
MCHC: 34.6 g/dL (ref 30.0–36.0)
MCV: 89.5 fL (ref 78.0–100.0)
PLATELETS: 236 10*3/uL (ref 150–400)
RBC: 3.81 MIL/uL — AB (ref 3.87–5.11)
RDW: 14.7 % (ref 11.5–15.5)
WBC: 11.5 10*3/uL — ABNORMAL HIGH (ref 4.0–10.5)

## 2014-06-28 LAB — TYPE AND SCREEN
ABO/RH(D): A POS
Antibody Screen: NEGATIVE

## 2014-06-28 LAB — RAPID HIV SCREEN (WH-MAU): SUDS RAPID HIV SCREEN: NONREACTIVE

## 2014-06-28 LAB — ABO/RH: ABO/RH(D): A POS

## 2014-06-28 LAB — RPR

## 2014-06-28 SURGERY — Surgical Case
Anesthesia: Epidural

## 2014-06-28 MED ORDER — DIPHENHYDRAMINE HCL 25 MG PO CAPS
25.0000 mg | ORAL_CAPSULE | ORAL | Status: DC | PRN
  Administered 2014-06-29 (×2): 25 mg via ORAL
  Filled 2014-06-28 (×2): qty 1

## 2014-06-28 MED ORDER — OXYTOCIN 40 UNITS IN LACTATED RINGERS INFUSION - SIMPLE MED
62.5000 mL/h | INTRAVENOUS | Status: DC
  Filled 2014-06-28: qty 1000

## 2014-06-28 MED ORDER — PHENYLEPHRINE 40 MCG/ML (10ML) SYRINGE FOR IV PUSH (FOR BLOOD PRESSURE SUPPORT): 80.0000 ug | PREFILLED_SYRINGE | INTRAVENOUS | Status: DC | PRN

## 2014-06-28 MED ORDER — ONDANSETRON HCL 4 MG/2ML IJ SOLN: 4.0000 mg | Freq: Three times a day (TID) | INTRAMUSCULAR | Status: DC | PRN

## 2014-06-28 MED ORDER — NALBUPHINE HCL 10 MG/ML IJ SOLN
5.0000 mg | INTRAMUSCULAR | Status: DC | PRN
  Filled 2014-06-28: qty 1

## 2014-06-28 MED ORDER — LACTATED RINGERS IV SOLN
INTRAVENOUS | Status: DC
  Administered 2014-06-28 – 2014-06-29 (×2): via INTRAVENOUS

## 2014-06-28 MED ORDER — LIDOCAINE-EPINEPHRINE (PF) 2 %-1:200000 IJ SOLN
INTRAMUSCULAR | Status: AC
  Filled 2014-06-28: qty 20

## 2014-06-28 MED ORDER — CEFAZOLIN SODIUM-DEXTROSE 2-3 GM-% IV SOLR
INTRAVENOUS | Status: DC | PRN
  Administered 2014-06-28: 2 g via INTRAVENOUS

## 2014-06-28 MED ORDER — PHENYLEPHRINE 40 MCG/ML (10ML) SYRINGE FOR IV PUSH (FOR BLOOD PRESSURE SUPPORT)
80.0000 ug | PREFILLED_SYRINGE | INTRAVENOUS | Status: DC | PRN
  Filled 2014-06-28 (×2): qty 10

## 2014-06-28 MED ORDER — ONDANSETRON HCL 4 MG/2ML IJ SOLN: 4.0000 mg | INTRAMUSCULAR | Status: DC | PRN

## 2014-06-28 MED ORDER — EPHEDRINE 5 MG/ML INJ: 10.0000 mg | INTRAVENOUS | Status: DC | PRN

## 2014-06-28 MED ORDER — SODIUM BICARBONATE 8.4 % IV SOLN
INTRAVENOUS | Status: AC
  Filled 2014-06-28: qty 50

## 2014-06-28 MED ORDER — SODIUM BICARBONATE 8.4 % IV SOLN
INTRAVENOUS | Status: DC | PRN
  Administered 2014-06-28 (×3): 5 mL via EPIDURAL

## 2014-06-28 MED ORDER — MORPHINE SULFATE 0.5 MG/ML IJ SOLN
INTRAMUSCULAR | Status: AC
  Filled 2014-06-28: qty 10

## 2014-06-28 MED ORDER — DIBUCAINE 1 % RE OINT
1.0000 "application " | TOPICAL_OINTMENT | RECTAL | Status: DC | PRN
  Filled 2014-06-28: qty 28

## 2014-06-28 MED ORDER — ONDANSETRON HCL 4 MG/2ML IJ SOLN
4.0000 mg | Freq: Four times a day (QID) | INTRAMUSCULAR | Status: DC | PRN
Start: 2014-06-28 — End: 2014-06-28

## 2014-06-28 MED ORDER — NALBUPHINE HCL 10 MG/ML IJ SOLN
INTRAMUSCULAR | Status: AC
  Administered 2014-06-28: 5 mg via SUBCUTANEOUS
  Filled 2014-06-28: qty 1

## 2014-06-28 MED ORDER — NALOXONE HCL 1 MG/ML IJ SOLN
1.0000 ug/kg/h | INTRAVENOUS | Status: DC | PRN
  Filled 2014-06-28: qty 2

## 2014-06-28 MED ORDER — ONDANSETRON HCL 4 MG/2ML IJ SOLN
INTRAMUSCULAR | Status: DC | PRN
  Administered 2014-06-28: 4 mg via INTRAVENOUS

## 2014-06-28 MED ORDER — ONDANSETRON HCL 4 MG/2ML IJ SOLN
INTRAMUSCULAR | Status: AC
  Filled 2014-06-28: qty 2

## 2014-06-28 MED ORDER — MEASLES, MUMPS & RUBELLA VAC ~~LOC~~ INJ: 0.5000 mL | INJECTION | Freq: Once | SUBCUTANEOUS | Status: DC

## 2014-06-28 MED ORDER — ACETAMINOPHEN 325 MG PO TABS: 650.0000 mg | ORAL_TABLET | ORAL | Status: DC | PRN

## 2014-06-28 MED ORDER — DIPHENHYDRAMINE HCL 50 MG/ML IJ SOLN: 12.5000 mg | INTRAMUSCULAR | Status: DC | PRN

## 2014-06-28 MED ORDER — OXYCODONE-ACETAMINOPHEN 5-325 MG PO TABS: 2.0000 | ORAL_TABLET | ORAL | Status: DC | PRN

## 2014-06-28 MED ORDER — NALBUPHINE HCL 10 MG/ML IJ SOLN
5.0000 mg | Freq: Once | INTRAMUSCULAR | Status: AC | PRN
  Administered 2014-06-28: 5 mg via INTRAVENOUS

## 2014-06-28 MED ORDER — SODIUM CHLORIDE 0.9 % IJ SOLN: 3.0000 mL | INTRAMUSCULAR | Status: DC | PRN

## 2014-06-28 MED ORDER — BUPIVACAINE HCL (PF) 0.25 % IJ SOLN
INTRAMUSCULAR | Status: DC | PRN
  Administered 2014-06-28 (×2): 4 mL via EPIDURAL

## 2014-06-28 MED ORDER — NALOXONE HCL 0.4 MG/ML IJ SOLN: 0.4000 mg | INTRAMUSCULAR | Status: DC | PRN

## 2014-06-28 MED ORDER — IBUPROFEN 600 MG PO TABS
600.0000 mg | ORAL_TABLET | Freq: Four times a day (QID) | ORAL | Status: DC
  Administered 2014-06-28 – 2014-06-30 (×7): 600 mg via ORAL
  Filled 2014-06-28 (×7): qty 1

## 2014-06-28 MED ORDER — ZOLPIDEM TARTRATE 5 MG PO TABS: 5.0000 mg | ORAL_TABLET | Freq: Every evening | ORAL | Status: DC | PRN

## 2014-06-28 MED ORDER — LACTATED RINGERS IV SOLN
INTRAVENOUS | Status: DC | PRN
  Administered 2014-06-28 (×3): via INTRAVENOUS

## 2014-06-28 MED ORDER — TETANUS-DIPHTH-ACELL PERTUSSIS 5-2.5-18.5 LF-MCG/0.5 IM SUSP
0.5000 mL | Freq: Once | INTRAMUSCULAR | Status: AC
  Administered 2014-06-28: 0.5 mL via INTRAMUSCULAR
  Filled 2014-06-28: qty 0.5

## 2014-06-28 MED ORDER — OXYTOCIN 40 UNITS IN LACTATED RINGERS INFUSION - SIMPLE MED
1.0000 m[IU]/min | INTRAVENOUS | Status: DC
  Administered 2014-06-28: 2 m[IU]/min via INTRAVENOUS

## 2014-06-28 MED ORDER — INFLUENZA VAC SPLIT QUAD 0.5 ML IM SUSY
0.5000 mL | PREFILLED_SYRINGE | INTRAMUSCULAR | Status: AC
  Administered 2014-06-29: 0.5 mL via INTRAMUSCULAR
  Filled 2014-06-28: qty 0.5

## 2014-06-28 MED ORDER — HYDROMORPHONE HCL 1 MG/ML IJ SOLN: 0.2500 mg | INTRAMUSCULAR | Status: DC | PRN

## 2014-06-28 MED ORDER — OXYCODONE-ACETAMINOPHEN 5-325 MG PO TABS
1.0000 | ORAL_TABLET | ORAL | Status: DC | PRN
  Administered 2014-06-29 – 2014-06-30 (×3): 1 via ORAL
  Filled 2014-06-28: qty 1

## 2014-06-28 MED ORDER — LAMOTRIGINE ER 300 MG PO TB24
300.0000 mg | ORAL_TABLET | Freq: Every day | ORAL | Status: DC
  Administered 2014-06-29: 300 mg via ORAL
  Filled 2014-06-28 (×2): qty 1

## 2014-06-28 MED ORDER — OXYTOCIN 10 UNIT/ML IJ SOLN
INTRAMUSCULAR | Status: AC
  Filled 2014-06-28: qty 4

## 2014-06-28 MED ORDER — ONDANSETRON HCL 4 MG PO TABS: 4.0000 mg | ORAL_TABLET | ORAL | Status: DC | PRN

## 2014-06-28 MED ORDER — PNEUMOCOCCAL VAC POLYVALENT 25 MCG/0.5ML IJ INJ
0.5000 mL | INJECTION | INTRAMUSCULAR | Status: AC
  Administered 2014-06-29: 0.5 mL via INTRAMUSCULAR
  Filled 2014-06-28: qty 0.5

## 2014-06-28 MED ORDER — NALBUPHINE HCL 10 MG/ML IJ SOLN
5.0000 mg | INTRAMUSCULAR | Status: DC | PRN
  Administered 2014-06-28: 5 mg via SUBCUTANEOUS

## 2014-06-28 MED ORDER — KETOROLAC TROMETHAMINE 30 MG/ML IJ SOLN
30.0000 mg | Freq: Once | INTRAMUSCULAR | Status: AC
  Administered 2014-06-28: 30 mg via INTRAVENOUS

## 2014-06-28 MED ORDER — FENTANYL 2.5 MCG/ML BUPIVACAINE 1/10 % EPIDURAL INFUSION (WH - ANES)
14.0000 mL/h | INTRAMUSCULAR | Status: DC | PRN
  Administered 2014-06-28: 14 mL/h via EPIDURAL
  Filled 2014-06-28: qty 125

## 2014-06-28 MED ORDER — KETOROLAC TROMETHAMINE 30 MG/ML IJ SOLN: 30.0000 mg | Freq: Four times a day (QID) | INTRAMUSCULAR | Status: DC | PRN

## 2014-06-28 MED ORDER — PRENATAL MULTIVITAMIN CH
1.0000 | ORAL_TABLET | Freq: Every day | ORAL | Status: DC
  Administered 2014-06-29 – 2014-06-30 (×2): 1 via ORAL
  Filled 2014-06-28 (×2): qty 1

## 2014-06-28 MED ORDER — WITCH HAZEL-GLYCERIN EX PADS: 1.0000 "application " | MEDICATED_PAD | CUTANEOUS | Status: DC | PRN

## 2014-06-28 MED ORDER — NICOTINE 21 MG/24HR TD PT24
21.0000 mg | MEDICATED_PATCH | Freq: Every day | TRANSDERMAL | Status: DC
  Administered 2014-06-28: 21 mg via TRANSDERMAL
  Filled 2014-06-28 (×4): qty 1

## 2014-06-28 MED ORDER — SCOPOLAMINE 1 MG/3DAYS TD PT72
1.0000 | MEDICATED_PATCH | Freq: Once | TRANSDERMAL | Status: DC
  Administered 2014-06-28: 1.5 mg via TRANSDERMAL

## 2014-06-28 MED ORDER — OXYCODONE-ACETAMINOPHEN 5-325 MG PO TABS
2.0000 | ORAL_TABLET | ORAL | Status: DC | PRN
  Administered 2014-06-29: 2 via ORAL
  Filled 2014-06-28 (×2): qty 2

## 2014-06-28 MED ORDER — OXYCODONE-ACETAMINOPHEN 5-325 MG PO TABS: 1.0000 | ORAL_TABLET | ORAL | Status: DC | PRN

## 2014-06-28 MED ORDER — FENTANYL 2.5 MCG/ML BUPIVACAINE 1/10 % EPIDURAL INFUSION (WH - ANES)
INTRAMUSCULAR | Status: DC | PRN
  Administered 2014-06-28: 14 mL/h via EPIDURAL

## 2014-06-28 MED ORDER — MEPERIDINE HCL 25 MG/ML IJ SOLN: 6.2500 mg | INTRAMUSCULAR | Status: DC | PRN

## 2014-06-28 MED ORDER — LIDOCAINE-EPINEPHRINE (PF) 2 %-1:200000 IJ SOLN
INTRAMUSCULAR | Status: DC | PRN
  Administered 2014-06-28: 3 mL

## 2014-06-28 MED ORDER — OXYTOCIN 40 UNITS IN LACTATED RINGERS INFUSION - SIMPLE MED: 62.5000 mL/h | INTRAVENOUS | Status: AC

## 2014-06-28 MED ORDER — CEFAZOLIN SODIUM-DEXTROSE 2-3 GM-% IV SOLR
INTRAVENOUS | Status: AC
  Filled 2014-06-28: qty 50

## 2014-06-28 MED ORDER — NALBUPHINE HCL 10 MG/ML IJ SOLN: 5.0000 mg | Freq: Once | INTRAMUSCULAR | Status: AC | PRN

## 2014-06-28 MED ORDER — OLANZAPINE 7.5 MG PO TABS
15.0000 mg | ORAL_TABLET | Freq: Every day | ORAL | Status: DC
  Administered 2014-06-28 – 2014-06-29 (×2): 15 mg via ORAL
  Filled 2014-06-28 (×3): qty 2

## 2014-06-28 MED ORDER — CITRIC ACID-SODIUM CITRATE 334-500 MG/5ML PO SOLN
30.0000 mL | ORAL | Status: DC | PRN
Start: 2014-06-28 — End: 2014-06-28
  Administered 2014-06-28: 30 mL via ORAL
  Filled 2014-06-28: qty 15

## 2014-06-28 MED ORDER — FLEET ENEMA 7-19 GM/118ML RE ENEM: 1.0000 | ENEMA | RECTAL | Status: DC | PRN

## 2014-06-28 MED ORDER — TERBUTALINE SULFATE 1 MG/ML IJ SOLN: 0.2500 mg | Freq: Once | INTRAMUSCULAR | Status: DC | PRN

## 2014-06-28 MED ORDER — SCOPOLAMINE 1 MG/3DAYS TD PT72
MEDICATED_PATCH | TRANSDERMAL | Status: AC
  Filled 2014-06-28: qty 1

## 2014-06-28 MED ORDER — LACTATED RINGERS IV SOLN
INTRAVENOUS | Status: DC
  Administered 2014-06-28 (×2): via INTRAVENOUS
  Administered 2014-06-28: 125 mL/h via INTRAVENOUS

## 2014-06-28 MED ORDER — SENNOSIDES-DOCUSATE SODIUM 8.6-50 MG PO TABS
2.0000 | ORAL_TABLET | ORAL | Status: DC
  Administered 2014-06-28 – 2014-06-29 (×2): 2 via ORAL
  Filled 2014-06-28 (×2): qty 2

## 2014-06-28 MED ORDER — SIMETHICONE 80 MG PO CHEW
80.0000 mg | CHEWABLE_TABLET | ORAL | Status: DC
  Administered 2014-06-28 – 2014-06-29 (×2): 80 mg via ORAL
  Filled 2014-06-28 (×2): qty 1

## 2014-06-28 MED ORDER — OXYTOCIN BOLUS FROM INFUSION: 500.0000 mL | INTRAVENOUS | Status: DC

## 2014-06-28 MED ORDER — DIPHENHYDRAMINE HCL 50 MG/ML IJ SOLN
12.5000 mg | INTRAMUSCULAR | Status: DC | PRN
  Administered 2014-06-28: 12.5 mg via INTRAVENOUS
  Filled 2014-06-28: qty 1

## 2014-06-28 MED ORDER — LIDOCAINE HCL (PF) 1 % IJ SOLN: 30.0000 mL | INTRAMUSCULAR | Status: DC | PRN

## 2014-06-28 MED ORDER — KETOROLAC TROMETHAMINE 30 MG/ML IJ SOLN
INTRAMUSCULAR | Status: AC
  Administered 2014-06-28: 30 mg via INTRAVENOUS
  Filled 2014-06-28: qty 1

## 2014-06-28 MED ORDER — SODIUM CHLORIDE 0.9 % IR SOLN
Status: DC | PRN
  Administered 2014-06-28: 1000 mL

## 2014-06-28 MED ORDER — LACTATED RINGERS IV SOLN
500.0000 mL | INTRAVENOUS | Status: DC | PRN
  Administered 2014-06-28: 1000 mL via INTRAVENOUS
  Administered 2014-06-28: 200 mL via INTRAVENOUS

## 2014-06-28 MED ORDER — MORPHINE SULFATE (PF) 0.5 MG/ML IJ SOLN
INTRAMUSCULAR | Status: DC | PRN
  Administered 2014-06-28: 4 mg via EPIDURAL

## 2014-06-28 MED ORDER — MEPERIDINE HCL 25 MG/ML IJ SOLN
INTRAMUSCULAR | Status: DC | PRN
  Administered 2014-06-28 (×2): 12.5 mg via INTRAVENOUS

## 2014-06-28 MED ORDER — OXYTOCIN 10 UNIT/ML IJ SOLN
40.0000 [IU] | INTRAMUSCULAR | Status: DC | PRN
  Administered 2014-06-28: 40 [IU] via INTRAVENOUS

## 2014-06-28 MED ORDER — LACTATED RINGERS IV SOLN
500.0000 mL | Freq: Once | INTRAVENOUS | Status: AC
Start: 2014-06-28 — End: 2014-06-28
  Administered 2014-06-28: 500 mL via INTRAVENOUS

## 2014-06-28 SURGICAL SUPPLY — 41 items
APL SKNCLS STERI-STRIP NONHPOA (GAUZE/BANDAGES/DRESSINGS) ×1
BENZOIN TINCTURE PRP APPL 2/3 (GAUZE/BANDAGES/DRESSINGS) ×2 IMPLANT
CLAMP CORD UMBIL (MISCELLANEOUS) IMPLANT
CLOSURE WOUND 1/2 X4 (GAUZE/BANDAGES/DRESSINGS) ×1
CLOTH BEACON ORANGE TIMEOUT ST (SAFETY) ×3 IMPLANT
CONTAINER PREFILL 10% NBF 15ML (MISCELLANEOUS) IMPLANT
DRAPE SHEET LG 3/4 BI-LAMINATE (DRAPES) IMPLANT
DRSG OPSITE POSTOP 4X10 (GAUZE/BANDAGES/DRESSINGS) ×3 IMPLANT
DURAPREP 26ML APPLICATOR (WOUND CARE) ×3 IMPLANT
ELECT REM PT RETURN 9FT ADLT (ELECTROSURGICAL) ×3
ELECTRODE REM PT RTRN 9FT ADLT (ELECTROSURGICAL) ×1 IMPLANT
EXTRACTOR VACUUM M CUP 4 TUBE (SUCTIONS) IMPLANT
EXTRACTOR VACUUM M CUP 4' TUBE (SUCTIONS)
GLOVE ECLIPSE 7.0 STRL STRAW (GLOVE) ×6 IMPLANT
GOWN STRL REUS W/TWL LRG LVL3 (GOWN DISPOSABLE) ×6 IMPLANT
KIT ABG SYR 3ML LUER SLIP (SYRINGE) IMPLANT
NDL HYPO 25X5/8 SAFETYGLIDE (NEEDLE) IMPLANT
NEEDLE HYPO 25X5/8 SAFETYGLIDE (NEEDLE) IMPLANT
NS IRRIG 1000ML POUR BTL (IV SOLUTION) ×3 IMPLANT
PACK C SECTION WH (CUSTOM PROCEDURE TRAY) ×3 IMPLANT
PAD ABD 8X7 1/2 STERILE (GAUZE/BANDAGES/DRESSINGS) ×2 IMPLANT
PAD OB MATERNITY 4.3X12.25 (PERSONAL CARE ITEMS) ×3 IMPLANT
RETRACTOR WND ALEXIS 25 LRG (MISCELLANEOUS) IMPLANT
RETRACTOR WOUND ALXS 34CM XLRG (MISCELLANEOUS) IMPLANT
RTRCTR WOUND ALEXIS 25CM LRG (MISCELLANEOUS)
RTRCTR WOUND ALEXIS 34CM XLRG (MISCELLANEOUS)
SPONGE GAUZE 4X4 12PLY STER LF (GAUZE/BANDAGES/DRESSINGS) ×2 IMPLANT
STAPLER VISISTAT 35W (STAPLE) IMPLANT
STRIP CLOSURE SKIN 1/2X4 (GAUZE/BANDAGES/DRESSINGS) ×1 IMPLANT
SUT CHROMIC 2 0 SH (SUTURE) ×2 IMPLANT
SUT CHROMIC 3 0 SH 27 (SUTURE) ×2 IMPLANT
SUT MNCRL 0 VIOLET CTX 36 (SUTURE) ×3 IMPLANT
SUT MON AB 2-0 CT1 27 (SUTURE) ×6 IMPLANT
SUT MONOCRYL 0 CTX 36 (SUTURE) ×8
SUT PLAIN 0 NONE (SUTURE) IMPLANT
SUT PLAIN 2 0 XLH (SUTURE) ×2 IMPLANT
SUT VIC AB 3-0 PS2 18 (SUTURE) ×2 IMPLANT
TAPE CLOTH SURG 4X10 WHT LF (GAUZE/BANDAGES/DRESSINGS) ×2 IMPLANT
TOWEL OR 17X24 6PK STRL BLUE (TOWEL DISPOSABLE) ×3 IMPLANT
TRAY FOLEY CATH 14FR (SET/KITS/TRAYS/PACK) IMPLANT
WATER STERILE IRR 1000ML POUR (IV SOLUTION) ×3 IMPLANT

## 2014-06-28 NOTE — Anesthesia Postprocedure Evaluation (Signed)
  Anesthesia Post-op Note  Patient: [name]  Procedure(s) Performed: Procedure(s): CESAREAN SECTION (N/A)  Patient is awake, responsive, moving her legs, and has signs of resolution of her numbness. Pain and nausea are reasonably well controlled. Vital signs are stable and clinically acceptable. Oxygen saturation is clinically acceptable. There are no apparent anesthetic complications at this time. Patient is ready for discharge.

## 2014-06-28 NOTE — Progress Notes (Signed)
Pt is a 43 y/o white female [id number] who presented to the ER with SROM. PNC was complicated by AMA- she had a normal NIPT. She also has a h.o. C/S x 2 and wishes to have a TOLAC. She expresses her understanding of the risks and possible complications. 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 She has a h.o. Bipolar disease and is on Zyprexa. She had GDM last preg. This preg she had an abnormal OGTT but a normal 3 hour.  PMHx: See [facility] PE: VSSAF        HEENT- wnl        ABD- gravid, soft, non tender        FHTs- reactive. IMP/ IUP at term with SROM         H.O. C/S x 2 will attempt TOLAC         H.O. HSV on Valtrex         H.O. Bipolar disease Plan/ Admit

## 2014-06-28 NOTE — Anesthesia Postprocedure Evaluation (Signed)
  Anesthesia Post-op Note  Patient: [name]  Procedure(s) Performed: Procedure(s): CESAREAN SECTION (N/A)  Patient Location: Mother/Baby  Anesthesia Type:Epidural  Level of Consciousness: awake, alert  and oriented  Airway and Oxygen Therapy: Patient Spontanous Breathing  Post-op Pain: none  Post-op Assessment: Post-op Vital signs reviewed and Patient's Cardiovascular Status Stable  Post-op Vital Signs: Reviewed and stable  Last Vitals:  Filed Vitals:   06/28/14 1529  BP: 113/54  Pulse: 86  Temp: 38.2 C  Resp: 16    Complications: No apparent anesthesia complications

## 2014-06-28 NOTE — Anesthesia Preprocedure Evaluation (Addendum)
Anesthesia Evaluation  Patient identified by MRN, date of birth, ID band Patient awake    Reviewed: Allergy & Precautions, H&P , NPO status , Patient's Chart, lab work & pertinent test results  History of Anesthesia Complications Negative for: history of anesthetic complications  Airway Mallampati: II  TM Distance: >3 FB Neck ROM: Full    Dental  (+) Teeth Intact   Pulmonary neg shortness of breath, neg COPDneg recent URI, Current Smoker,          Cardiovascular negative cardio ROS  Rhythm:Regular     Neuro/Psych PSYCHIATRIC DISORDERS Depression Bipolar Disorder negative neurological ROS     GI/Hepatic negative GI ROS, Neg liver ROS,   Endo/Other  negative endocrine ROS  Renal/GU negative Renal ROS     Musculoskeletal   Abdominal   Peds  Hematology  (+) anemia ,   Anesthesia Other Findings   Reproductive/Obstetrics (+) Pregnancy                            Anesthesia Physical Anesthesia Plan  ASA: II  Anesthesia Plan: Epidural   Post-op Pain Management:    Induction:   Airway Management Planned:   Additional Equipment:   Intra-op Plan:   Post-operative Plan:   Informed Consent: I have reviewed the patients History and Physical, chart, labs and discussed the procedure including the risks, benefits and alternatives for the proposed anesthesia with the patient or authorized representative who has indicated his/her understanding and acceptance.     Plan Discussed with: Anesthesiologist  Anesthesia Plan Comments:         Anesthesia Quick Evaluation

## 2014-06-28 NOTE — Progress Notes (Signed)
Dr. Loletta Specter phone and reviewed strip. MD not concerned at this time and directions given to call if patient status changes. Will continue to monitor.

## 2014-06-28 NOTE — Anesthesia Procedure Notes (Signed)
Epidural Patient location during procedure: OB Start time: 06/28/2014 1:48 AM End time: 06/28/2014 2:14 AM  Staffing Anesthesiologist: Sumayya Muha, CHRIS Performed by: anesthesiologist   Preanesthetic Checklist Completed: patient identified, surgical consent, pre-op evaluation, timeout performed, IV checked, risks and benefits discussed and monitors and equipment checked  Epidural Patient position: sitting Prep: site prepped and draped and DuraPrep Patient monitoring: heart rate, cardiac monitor, continuous pulse ox and blood pressure Approach: midline Location: L4-L5 Injection technique: LOR saline  Needle:  Needle type: Tuohy  Needle gauge: 17 G Needle length: 9 cm Needle insertion depth: 7 cm Catheter type: closed end flexible Catheter size: 19 Gauge Catheter at skin depth: 14 cm Test dose: negative and 2% lidocaine with Epi 1:200 K  Assessment Events: blood not aspirated, injection not painful, no injection resistance, negative IV test and no paresthesia  Additional Notes H+P and labs checked, risks and benefits discussed with the patient, consent obtained, procedure tolerated well and without complications.  Reason for block:procedure for pain

## 2014-06-28 NOTE — Op Note (Signed)
[name], [name]               ACCOUNT NO.:  [id number]  MEDICAL RECORD NO.:  [id number]  LOCATION:  9144                          FACILITY:  [facility]  PHYSICIAN:  Freda Munro, M.D.    DATE OF BIRTH:  [birth year]  DATE OF PROCEDURE:  06/28/2014 DATE OF DISCHARGE:                              OPERATIVE REPORT   PREOPERATIVE DIAGNOSES: 1. Intrauterine pregnancy at term. 2. History of prior cesarean section x2. 3. The patient declines to attempt vaginal birth after cesarean     section.  POSTOPERATIVE DIAGNOSIS: 1. Intrauterine pregnancy at term. 2. History of prior cesarean section x2. 3. The patient declines to attempt vaginal birth after cesarean     section.  PROCEDURE: 1. Repeat low-transverse cesarean section. 2. Repair of 1-cm cystotomy.  SURGEON:  Freda Munro, M.D.  ASSISTANT:  Nobody.  ESTIMATED BLOOD LOSS:  900 mL.  SPECIMENS:  None.  INDICATIONS:  [name] is a 43 year old white female, G3, P1-1-0-2 at 17 and 6 weeks estimated gestational age who was admitted to Labor and Delivery on October 29 with spontaneous rupture of membranes.  The patient had been indecisive on her desires for a VBAC.  On admission, she expressed her desire to proceed with attempt at vaginal birth after cesarean section.  She was admitted to Labor and Delivery where she completed her first stage without complications.  During second stage, the patient was noted to have a fetus which was at 0 station and the patient stated that she did not wish to push and wished to have repeat cesarean section.  At that time, she was taken to the operating room for a repeat cesarean section.  Her epidural anesthetic was reinjected. Once an adequate level was reached, a Foley catheter was placed.  She was placed in a dorsal supine position with a left lateral tilt.  A Pfannenstiel incision was made through the previous scar.  On entering the abdominal cavity, there were extensive adhesions noted in  the lower uterine area.  These were dissected away from the lower uterine segment. There was low transverse uterine incision made in the midline with Metzenbaum scissors and extended laterally with blunt dissection.  The head was lifted out of the pelvis.  The infant was delivered with a vacuum extractor.  The oropharynx and nostrils were bulb suctioned. Then, the remaining infant was then delivered.  The cord was doubly clamped and cut and the infant handed to awaiting NICU team.  The placenta was then manually removed.  The uterus was exteriorized and fallopian tubes and ovaries were noted to be normal.  The incision was closed in 2 layers of 0 Monocryl suture in a running locking fashion followed by imbricating layer.  The uterus was then placed back in the abdominal cavity and hemostasis was checked and felt to be adequate.  At this point, because of the extensive dissection needed to expose the lower uterine segment, the bladder was examined and noted to have a 1-cm cystostomy on the patient's right of the dome of the bladder.  This was closed with 2 layers of 2-0 chromic in a running fashion.  At that point, no other problems were noted with the  bladder.  The rectus muscles and parietal peritoneum were reapproximated at midline using 2-0 Monocryl in a running fashion.  The fascia was closed using 0 Monocryl suture in a running fashion.  The subcuticular tissue was made hemostatic and irrigated.  The subcuticular layer was closed with interrupted 2-0 plain gut suture and skin closed with 3-0 Vicryl in a subcuticular fashion.  The patient tolerated the procedure well.  She was taken to recovery room in stable condition.  Instrument and lap counts were correct x2.          ______________________________ Freda Munro, M.D.     MA/MEDQ  D:  06/28/2014  T:  06/28/2014  Job:  [id number]

## 2014-06-28 NOTE — Plan of Care (Signed)
Problem: Consults Goal: Birthing Suites Patient Information Press F2 to bring up selections list Outcome: Progressing  Pt 37-[redacted] weeks EGA

## 2014-06-28 NOTE — Lactation Note (Signed)
This note was copied from the chart of [name]. Lactation Consultation Note  Patient Name: [name] [id number] Date: 06/28/2014 Reason for consult: Initial assessment of this multipara, hx of bipolar and smoking 1 ppd.  Her nurse informs [name] that mom has asked when she can go outside to smoke but currently has IV and foley catheter, so mom requested a nicotine patch.  LC reviewed mom's current meds (lamictal, atarax, zyprexa).  Mom says she discussed these meds and their compatibility with breastfeeding with both her baby's pediatrician and with her psychiatrist and was told all meds are compatible. Per [facility] [address] resource, all meds are L-2 but there are some cautions regarding potential effects on baby.  Mom quickly dismisses these cautions, stating she was told there were no concerns.  LC recommends mom watch for poor feeding and discussed the single reported case of apnea with zyprexa.  LC reviewed reasons for hand expression and demonstrated technique.  Mom says she sees drops expressed.  LATCH scores of 7/8 per RN assessment have been reported and baby is just 52 hours old. Mom encouraged to feed baby 8-12 times/24 hours and with feeding cues. LC encouraged review of Baby and Me pp 9, 14 and 20-25 for STS and BF information. LC provided [name] Resource brochure and reviewed [name] services and list of community and web site resources.    Maternal Data Formula Feeding for Exclusion: No Has patient been taught Hand Expression?: Yes (LC reviewed and demonstrated; drops visible) Does the patient have breastfeeding experience prior to this delivery?: No (third baby; unable to breastfeed other two because of meds)  Feeding Feeding Type: Breast Fed Length of feed: 10 min  LATCH Score/Interventions Latch: Grasps breast easily, tongue down, lips flanged, rhythmical sucking.  Audible Swallowing: A few with stimulation  Type of Nipple: Everted at rest and after stimulation  Comfort  (Breast/Nipple): Soft / non-tender     Hold (Positioning): Assistance needed to correctly position infant at breast and maintain latch. Intervention(s): Support Pillows;Breastfeeding basics reviewed;Position options;Skin to skin  LATCH Score: 8 (previous feeding assessment, per RN)  Lactation Tools Discussed/Used   STS, hand expression, cue feedings Safety of medications while breastfeeding  Consult Status Consult Status: Follow-up Date: 06/29/14 Follow-up type: In-patient    [name] [name] 06/28/2014, 9:38 PM

## 2014-06-28 NOTE — Addendum Note (Signed)
Addendum created 06/28/14 1620 by Vernice Jefferson, CRNA   Modules edited: Notes Section   Notes Section:  File: [id number]

## 2014-06-28 NOTE — MAU Note (Signed)
Pt states she SROM at 66, baby has been active. Pt began contracting shortly after that.

## 2014-06-28 NOTE — Transfer of Care (Signed)
Immediate Anesthesia Transfer of Care Note  Patient: [name]  Procedure(s) Performed: Procedure(s): CESAREAN SECTION (N/A)  Patient Location: PACU  Anesthesia Type:Epidural  Level of Consciousness: awake  Airway & Oxygen Therapy: Patient Spontanous Breathing  Post-op Assessment: Report given to PACU RN  Post vital signs: Reviewed and stable  Complications: No apparent anesthesia complications

## 2014-06-29 ENCOUNTER — Encounter (HOSPITAL_COMMUNITY): Payer: Self-pay | Admitting: Obstetrics and Gynecology

## 2014-06-29 LAB — CBC
HEMATOCRIT: 26.6 % — AB (ref 36.0–46.0)
HEMOGLOBIN: 9.1 g/dL — AB (ref 12.0–15.0)
MCH: 31.4 pg (ref 26.0–34.0)
MCHC: 34.2 g/dL (ref 30.0–36.0)
MCV: 91.7 fL (ref 78.0–100.0)
Platelets: 190 10*3/uL (ref 150–400)
RBC: 2.9 MIL/uL — ABNORMAL LOW (ref 3.87–5.11)
RDW: 15.1 % (ref 11.5–15.5)
WBC: 8.9 10*3/uL (ref 4.0–10.5)

## 2014-06-29 LAB — BIRTH TISSUE RECOVERY COLLECTION (PLACENTA DONATION)

## 2014-06-29 NOTE — Addendum Note (Signed)
Addendum created 06/29/14 9532 by Asher Muir, CRNA   Modules edited: Notes Section   Notes Section:  File: [id number]

## 2014-06-29 NOTE — Plan of Care (Signed)
Problem: Phase I Progression Outcomes Goal: Voiding adequately Outcome: Not Met (add Reason) Catheter intact may not be removed until Am due to surgical concerns by MD..Patient taught foley care and practices independently.

## 2014-06-29 NOTE — Lactation Note (Signed)
This note was copied from the chart of [place]. Lactation Consultation Note  P3, First time breastfeeding. Baby latched in football position upon entering the room. Rhythmical sucks and swallows observed. Mother denies soreness. Reviewed engorgement care, hand pump use and massage to keep him active. Encouraged mother to call if she needs further assistance latching.  Patient Name: [name] [id number] Date: 06/29/2014 Reason for consult: Follow-up assessment   Maternal Data    Feeding Feeding Type: Breast Fed Length of feed: 15 min  LATCH Score/Interventions Latch: Grasps breast easily, tongue down, lips flanged, rhythmical sucking. Intervention(s): Adjust position;Assist with latch  Audible Swallowing: A few with stimulation Intervention(s): Skin to skin  Type of Nipple: Everted at rest and after stimulation  Comfort (Breast/Nipple): Soft / non-tender     Hold (Positioning): Assistance needed to correctly position infant at breast and maintain latch.  LATCH Score: 8  Lactation Tools Discussed/Used     Consult Status Consult Status: PRN    Carlye Grippe 06/29/2014, 10:33 AM

## 2014-06-29 NOTE — Progress Notes (Signed)
Clinical Social Work Department PSYCHOSOCIAL ASSESSMENT - MATERNAL/CHILD 06/29/2014  Patient:  [name], [name]  Account Number:  [id number]  Admit Date:  06/28/2014  [name] Name:   [name]   Clinical Social Worker:  [name], CLINICAL SOCIAL WORKER   Date/Time:  06/29/2014 10:00 AM  Date Referred:  06/28/2014   Referral source  Central Nursery     Referred reason  Behavioral Health Issues   Other referral source:    I:  FAMILY / HOME ENVIRONMENT Child's legal guardian:  PARENT  Guardian - Name [place] - Age Guardian - Address  [name] [address] [place], [place] [address]  [name]  same as above   Other household support members/support persons Name Relationship DOB   SON 2007   SON 2009   Other support:   MOB and FOB shared belief that they are well supported by family, friends, and their church community.    II  PSYCHOSOCIAL DATA Information Source:  Family Interview  Occupational hygienist Employment:   MOB and FOB stated that they feel supported by their employers, and discussed that they both work at [employer].   Financial resources:  Multimedia programmer If [place]:    School / Grade:   Maternity Care Coordinator / Child Services Coordination / Early Interventions:   None reported  Cultural issues impacting care:   None reported.    III  STRENGTHS Strengths  Adequate Resources  Home prepared for Child (including basic supplies)  Supportive family/friends   Strength comment:    IV  RISK FACTORS AND CURRENT PROBLEMS Current Problem:  YES   Risk Factor & Current Problem Patient Issue Family Issue Risk Factor / Current Problem Comment  Mental Illness Y N MOB presents with mental health history significant for bipolar.  MOB is currently on medications and receiving therapy to address her symptoms.  She denied acute symptoms and shared belief that symptoms are stable.         V  SOCIAL WORK  ASSESSMENT CSW met with the MOB in her room in order to complete the assessment. Consult was ordered due to MOB presenting with a mental health history significant for bipolar. MOB presented in a pleasant mood and displayed a full range in affect.  MOB provided consent for the FOB to be present for the assessment.  Assessment was brief due to MOB and FOB sharing belief that the MOB's mental health symptoms are well controlled and denied need for a social work visit.   MOB and FOB expressed excitement about the birth of the baby.  She shared belief that they are well supported by their family, friends, and employer.  They also discussed going to church and feeling grateful for the church's support.  MOB denied any questions or concerns as she transitioned into the postpartum period, and denied any recent acute stressors that may negatively impact her transition.   MOB denied any mood swings during her pregnancy.  She stated that she has been stable on her medications that treat her bipolar.  She is currently prescribed Zyprexa, Lamictal, and Atarax.  She shared that her psychiatrist is at [facility] office, and discussed that she attends therapy.  MOB shard that she is aware of her increased risk of developing postpartum depression due to her history, but she denied any concerns.   No barriers to discharge.  Please consult CSW with additional questions or concerns.   [place]  Patient/Family Education  No Further Intervention Required / No Barriers to Discharge   Type of pt/family education:   Postpartum depression   If child protective services report - county:   If child protective services report - date:   Information/referral to community resources comment:   MOB is currently receiving theray and medication management.  No additional referrals needed.   Other social work plan:   CSW to provide emotional support PRN.

## 2014-06-29 NOTE — Anesthesia Postprocedure Evaluation (Signed)
Anesthesia Post Note  Patient: [name]  Procedure(s) Performed: Procedure(s) (LRB): CESAREAN SECTION (N/A)  Anesthesia type: Epidural  Patient location: Mother/Baby  Post pain: Pain level controlled  Post assessment: Post-op Vital signs reviewed  Last Vitals:  Filed Vitals:   06/29/14 0740  BP: 120/65  Pulse: 85  Temp: 36.9 C  Resp: 18    Post vital signs: Reviewed  Level of consciousness:alert  Complications: No apparent anesthesia complications

## 2014-06-29 NOTE — Progress Notes (Signed)
Patient instructed to cleanse around catheter insertion site with soap and water and peri rinse bottle every 8 hours. Encouraged to rinse with peri bottle and change pad at least every 4 hours and practice good hand hygiene.

## 2014-06-29 NOTE — Progress Notes (Signed)
Patient states she has already taken lamotrigine today. Her husband brought the dose and she took it. Instructed her to bring the med from home for pharmacy verification and labeling so that we can scan for documentation.

## 2014-06-30 MED ORDER — IBUPROFEN 600 MG PO TABS: 600.0000 mg | ORAL_TABLET | Freq: Four times a day (QID) | ORAL | Status: DC | PRN

## 2014-06-30 MED ORDER — OXYCODONE-ACETAMINOPHEN 5-325 MG PO TABS: 1.0000 | ORAL_TABLET | ORAL | Status: DC | PRN

## 2014-06-30 MED ORDER — DOCUSATE SODIUM 100 MG PO CAPS: 100.0000 mg | ORAL_CAPSULE | Freq: Two times a day (BID) | ORAL | Status: DC

## 2014-06-30 NOTE — Lactation Note (Signed)
This note was copied from the chart of [place]. Lactation Consultation Note; Called to mom's room to observe latch. Baby was already in nursery for circ. Mom reports he is latching well but sliding to tip of nipple. Nipples intact but mom reports they feel a little tender. Encouraged to rub EBM into them after feedings. Encouraged to [employer] and relatch baby if  she feels he is sliding to tip. No questions at present. To call prn  Patient Name: [name] [id number] Date: 06/30/2014 Reason for consult: Follow-up assessment   Maternal Data    Feeding Length of feed: 15 min  LATCH Score/Interventions       Type of Nipple: Everted at rest and after stimulation  Comfort (Breast/Nipple): Filling, red/small blisters or bruises, mild/mod discomfort  Problem noted: Filling        Lactation Tools Discussed/Used     Consult Status Consult Status: Complete Date: 06/30/14 Follow-up type: In-patient    Truddie Crumble 06/30/2014, 11:35 AM

## 2014-06-30 NOTE — Lactation Note (Addendum)
This note was copied from the chart of [place]. Lactation Consultation Note     Follow up consult with this mom and baby, now 66 hours old. Mom called for help with latch, since she was having some discomfort with latching earlier. On exam of baby's mouth, he has a frenulum atached to the tip of his tongue, causing a heart shaped tongue with extension. Since the baby was already at 8% weight loss last night, I suggested mom pump a few times a day to protect her milk supply. Mom agreed to try a DEP. The baby was latched in football hold to right breast, and fed well for 15 minutes. After this, I had mom try pumping with  DEP, and instructed her in it's use. Mom's milk is transitioning in, and she was able to express little from the breast the baby fed from, and 7 ml's from the left breast. I told mom this means he probably transferred well from the right breast. The baby would not take a bottle, from parent or me, so mom agreed to a foley cup. I demonstrated the use of the cup with mom holding the baby. He swallowed and tolerated the 7 mls well, and then mom put the baby back to the   breast.  H e appears to latch deeply, and mom denies discomfort. Mom decided since the baby did not like the bottle, she did not want to rent the pump. I told her that was fine, but to still use her hand pump if she finds her breast very full, and the baby does not soften. I then told parents I would just charge them for the pump kit, and they said I did not disclose the cost of th kit, just the pump rental. I agreed, apologized, and told them they could keep the kit, but I would not charge them. The parents know to mention the baby's oral anatomy to their pediatrician.  I left after this. .   Patient Name: [name] ELTRV'U Date: 06/30/2014 Reason for consult: Follow-up assessment   Maternal Data    Feeding Feeding Type: Breast Fed Length of feed: 25 min  LATCH Score/Interventions Latch: Grasps breast  easily, tongue down, lips flanged, rhythmical sucking. Intervention(s): Adjust position;Assist with latch;Breast compression  Audible Swallowing: Spontaneous and intermittent Intervention(s): Skin to skin;Hand expression  Type of Nipple: Everted at rest and after stimulation  Comfort (Breast/Nipple): Soft / non-tender  Interventions (Filling): Hand pump  Hold (Positioning): Assistance needed to correctly position infant at breast and maintain latch. (I showed mom how to position for football) Intervention(s): Breastfeeding basics reviewed;Support Pillows;Position options;Skin to skin  LATCH Score: 9  Lactation Tools Discussed/Used     Consult Status Consult Status: Complete Follow-up type: Call as needed    Tonna Corner 06/30/2014, 2:40 PM

## 2014-06-30 NOTE — Progress Notes (Addendum)
Subjective: Postpartum Day 1: Cesarean Delivery Patient reports pain controlled, no nausea or vomiting, foley catheter still in place  Objective: Vital signs in last 24 hours: AFVSS   Physical Exam:  General: alert, cooperative and appears stated age 43: appropriate Uterine Fundus: firm Incision: healing well DVT Evaluation: No evidence of DVT seen on physical exam.   Recent Labs  06/28/14 0125 06/29/14 0602  HGB 11.8* 9.1*  HCT 34.1* 26.6*    Assessment/Plan: Postop day #1 status post repeat low transverse cesarean section with incidental cystotomy Status post Cesarean section. Doing well postoperatively. urine in Foley bag is clear. Will leave Foley catheter in place for 24 hours after surgery. Continue current care. Desire neonatal circumcision. Risks, benefits, alternatives reviewed. Patient wishes to proceed   Nayshawn Mesta H. 06/30/2014, 1:11 PM

## 2014-06-30 NOTE — Lactation Note (Signed)
This note was copied from the chart of [place]. Lactation Consultation Note    Follow up consult with this mom of a term baby, now 48 hours old, and at 8% weight loss. Mom would like to go home later today. On exam, she has easily EBM, and I showed mom how to hand express. Mom reports latch causing discomfort. Baby asleep in dad's arms at this time. i asked mom to call for lactation, for me to assist with latch, when baby next feeds.  Mom is on 3 medication for her bi-polar disorder - I told her I looked them up in the [facility] book, and all 3 are safe, level 2's.  I also advvied mom to try and not smoke prior to latching, which will decrease her let down.  I also suggested she not smoke for her health and the baby's, and mom agreed.  Patient Name: [name] [id number] Date: 06/30/2014 Reason for consult: Follow-up assessment   Maternal Data    Feeding Length of feed: 15 min  LATCH Score/Interventions       Type of Nipple: Everted at rest and after stimulation  Comfort (Breast/Nipple): Filling, red/small blisters or bruises, mild/mod discomfort  Problem noted: Filling        Lactation Tools Discussed/Used     Consult Status Consult Status: Follow-up Date: 06/30/14 Follow-up type: In-patient    Tonna Corner 06/30/2014, 10:20 AM

## 2014-06-30 NOTE — Discharge Summary (Signed)
Obstetric Discharge Summary Reason for Admission: onset of labor Prenatal Procedures: NST and ultrasound Intrapartum Procedures: cesarean: low cervical, transverse, repair of incidental cystotomy Postpartum Procedures: none Complications-Operative and Postpartum: incidental cystotomy repaired at the time of cesarean Hemoglobin  Date Value Ref Range Status  06/29/2014 9.1* 12.0 - 15.0 g/dL Final     DELTA CHECK NOTED     REPEATED TO VERIFY     HCT  Date Value Ref Range Status  06/29/2014 26.6* 36.0 - 46.0 % Final    Physical Exam:  General: alert, cooperative and appears stated age 43: appropriate Uterine Fundus: firm Incision: healing well DVT Evaluation: No evidence of DVT seen on physical exam.  Discharge Diagnoses: Term Pregnancy-delivered, patient desired a trial of labor after history of 2 prior cesarean sections. She made it to complete and pushing. She pushed for 3 attempts and then requested a repeat cesarean. Incidental cystotomy occurred at the time of cesarean. This was repaired intraoperatively and the Foley catheter remained in place for 24 hours. At the time of discharge the patient was voiding without difficulty. Her urine was clear.  Discharge Information: Date: 06/30/2014 Activity: pelvic rest Diet: routine Medications: Ibuprofen, Colace and Percocet Condition: improved Instructions: refer to practice specific booklet Discharge to: home Follow-up Information   Follow up with Marcial Pacas., MD In 3 days. (At 2pm for a incision check)    Specialty:  Obstetrics and Gynecology   Contact information:   [place] [id number] [phone]       Newborn Data: Live born female  Birth Weight: 8 lb 10.3 oz (3921 g) APGAR: 8, 9  Home with mother.  Chevelle Durr H. 06/30/2014, 1:18 PM

## 2014-07-02 ENCOUNTER — Encounter (HOSPITAL_COMMUNITY): Payer: Self-pay | Admitting: Obstetrics and Gynecology

## 2014-07-02 NOTE — H&P (Addendum)
43 y.o. [id number] @ [redacted]w[redacted]d presents with ROM.  Otherwise has good fetal movement and no bleeding. Prior C/S x 2, strongly desires VBAC  Past Medical History  Diagnosis Date  . Bipolar disorder   . Gestational diabetes     previous pregnancy  . HSV (herpes simplex virus) anogenital infection   . AMA (advanced maternal age) multigravida 50+   . Vaginal Pap smear, abnormal     Past Surgical History  Procedure Laterality Date  . Cesarean section      x2  . Ankle surgery      reconstructive, bilateral  . Leep    . Cesarean section N/A 06/28/2014    Procedure: CESAREAN SECTION;  Surgeon: Olga Millers, MD;  Location: [facility];  Service: Obstetrics;  Laterality: N/A;    OB History  Gravida Para Term Preterm AB SAB TAB Ectopic Multiple Living  3 3 2 1      3     # Outcome Date GA Lbr Len/2nd Weight Sex Delivery Anes PTL Lv  3 Term 06/28/14 [redacted]w[redacted]d 07:10 / 02:37 3.921 kg (8 lb 10.3 oz) M CS-LTranv EPI  Y  2 Term 2009 [redacted]w[redacted]d  3.26 kg (7 lb 3 oz)  CS-LTranv Spinal  Y  1 Preterm 2007 [redacted]w[redacted]d 20:00 3.402 kg (7 lb 8 oz) M CS-LTranv   Y      History   Social History  . Marital Status: Married    Spouse Name: N/A    Number of Children: N/A  . Years of Education: N/A   Occupational History  . Not on file.   Social History Main Topics  . Smoking status: Current Every Day Smoker -- 0.50 packs/day  . Smokeless tobacco: Not on file  . Alcohol Use: No  . Drug Use: No  . Sexual Activity: Yes   Other Topics Concern  . Not on file   Social History Narrative   Review of patient's allergies indicates no known allergies.    Prenatal Transfer Tool   Other PNC: uncomplicated.    Filed Vitals:   06/30/14 1358  BP:   Pulse:   Temp: 100.1 F (37.8 C)  Resp:      General:  NAD Lungs: CTAB Cardiac: RRR Abdomen:  soft, gravid SVE:  2-3/100 per Bettina Gavia, RN Grossly ruptured EFM: overall cat 2 w occ variable decels, mod variability    A/P   42 y.o. [redacted]w[redacted]d  [id number]  presents with ROM Prior CD x 2.  Strongly desires TOLAC.  Has been counseled extensively in office about r/b/a, including uterine rupture and resultant severe fetal, maternal morbidity/mortality.  Consent signed.  FSR/ vtx/ GBS neg  [place], [facility]

## 2014-07-04 ENCOUNTER — Ambulatory Visit (HOSPITAL_COMMUNITY): Payer: BC Managed Care – PPO

## 2014-07-05 ENCOUNTER — Ambulatory Visit (HOSPITAL_COMMUNITY)
Admission: RE | Admit: 2014-07-05 | Discharge: 2014-07-05 | Disposition: A | Discharge status: Home / Self Care | Payer: BC Managed Care – PPO | Source: Ambulatory Visit | Attending: Pediatrics | Admitting: Pediatrics

## 2014-07-05 ENCOUNTER — Ambulatory Visit (HOSPITAL_COMMUNITY): Payer: BC Managed Care – PPO

## 2014-07-05 NOTE — Lactation Note (Signed)
Lactation Consult Baby's Name: [name] Date of Birth: 06/28/2012 Pediatrician: Dellie Burns Gender: female Gestational Age: [redacted]w[redacted]d (At Birth) Birth Weight: 8 lb 10.3 oz (3921 g) Weight at Discharge: Weight: 7 lb 15.3 oz (3610 g)Date of Discharge: 06/30/2014 Filed Weights   06/28/14 1113 06/29/14 0000 06/30/14 0000  Weight: 8 lb 10.3 oz (3921 g) 8 lb 3.4 oz (3725 g) 7 lb 15.3 oz (3610 g)    Mother's reason for visit:  To see how breast feeding is going Visit Type:  Feeding assessment- had frenulum clipped by Dr Constance Holster on Tuiesday  Consult:  Initial Lactation Consultant:  Linus Mako D  ________________________________________________________________________    ________________________________________________________________________  Mother's Name: [name] Type of delivery:   Breastfeeding Experience:  P3 first time BF    ________________________________________________________________________  Breastfeeding History (Post Discharge)  Frequency of breastfeeding:  q 2-3- is giving bottles fo EBM  Duration of feeding:  40 min  Supplementation       Breastmilk:  Volume 2 oz Frequency:  4-5 times/day   Method:  Bottle,   Pumping  Type of pump:  Medela pump in style Frequency:  4 times/day Volume:  2-3 oz  Infant Intake and Output Assessment  Voids:  6-8 in 24 hrs.  Color:  Clear yellow Stools:  8-10 in 24 hrs.  Color:  Yellow  ________________________________________________________________________  Maternal Breast Assessment  Breast:  Soft and Filling Nipple:  Erect Pain level:  0   _______________________________________________________________________ Feeding Assessment/Evaluation  Initial feeding assessment:  Infant's oral assessment:  Variance  Positioning:  Cradle Right breast  LATCH documentation:  Latch:  2 = Grasps breast easily, tongue down, lips flanged, rhythmical sucking.  Audible swallowing:  1 = A  few with stimulation  Type of nipple:  2 = Everted at rest and after stimulation  Comfort (Breast/Nipple):  2 = Soft / non-tender  Hold (Positioning):  1 = Assistance needed to correctly position infant at breast and maintain latch  LATCH score:  8  Attached assessment:  Deep  Lips flanged:  Yes.    Lips untucked:  Yes.    Suck assessment:  Displays both   Pre-feed weight: 4032 g  (8 lb. 14.2 oz.) Post-feed weight: 4044 g (8 lb. 14.7 oz.) Amount transferred:  54ml   Additional Feeding Assessment -   Infant's oral assessment:  Variance- baby had frenulum clipped Tuesday- plans to go back to Dr Constance Holster to see if needs to be clipped a little deeper  Positioning:  Cradle Left breast  LATCH documentation:  Latch:  2 = Grasps breast easily, tongue down, lips flanged, rhythmical sucking.  Audible swallowing:  1 = A few with stimulation  Type of nipple:  2 = Everted at rest and after stimulation  Comfort (Breast/Nipple):  2 = Soft / non-tender  Hold (Positioning):  1 = Assistance needed to correctly position infant at breast and maintain latch  LATCH score:  8  Attached assessment:  Deep  Lips flanged:  Yes.    Lips untucked:  No.  Suck assessment:  Displays both    Pre-feed weight:  4044 g  (8 lb. 14.7 oz.) Post-feed weight: 4050 g (8 lb. 14.9 oz.) Amount transferred:  6 ml Amount supplemented:  35 ml      Total amount transferred:  18 ml Total supplement given:  35 ml  Alexander had 2 oz of EBM by bottle about 1/2 hour before he came to appointment. Did latch well and mom reports no pain with nursing.  Few swallows heard and non nutritive sucking noted after a few minutes at the breast.Encouraged mom to continue pumping if baby does not nurse to promote a good milk supply. Asking about milk storage- reviewed guidelines with mom. No further questions at present. To call prn

## 2014-08-02 ENCOUNTER — Inpatient Hospital Stay (HOSPITAL_COMMUNITY)
Admission: RE | Admit: 2014-08-02 | Payer: BC Managed Care – PPO | Source: Ambulatory Visit | Admitting: Obstetrics and Gynecology

## 2014-08-02 ENCOUNTER — Encounter (HOSPITAL_COMMUNITY): Admission: RE | Payer: Self-pay | Source: Ambulatory Visit

## 2014-08-02 SURGERY — Surgical Case
Anesthesia: Regional | Site: Abdomen

## 2015-03-28 ENCOUNTER — Encounter: Payer: Self-pay | Admitting: Internal Medicine

## 2015-03-28 ENCOUNTER — Ambulatory Visit (INDEPENDENT_AMBULATORY_CARE_PROVIDER_SITE_OTHER): Payer: BC Managed Care – PPO | Admitting: Internal Medicine

## 2015-03-28 VITALS — BP 102/68 | HR 65 | Temp 98.3°F | Resp 16 | Wt 157.0 lb

## 2015-03-28 DIAGNOSIS — H60391 Other infective otitis externa, right ear: Secondary | ICD-10-CM

## 2015-03-28 MED ORDER — NEOMYCIN-POLYMYXIN-HC 3.5-10000-1 OT SOLN: 4.0000 [drp] | Freq: Four times a day (QID) | OTIC | Status: DC
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
## 2015-03-28 NOTE — Progress Notes (Signed)
Pre visit review using our clinic review tool, if applicable. No additional management support is needed unless otherwise documented below in the visit note. 

## 2015-03-28 NOTE — Patient Instructions (Signed)
  Flonase 1 spray in right nostril twice a day as needed. Use the "crossover" technique as discussed. Please keep ear as dry as possible

## 2015-04-08 ENCOUNTER — Ambulatory Visit: Payer: BC Managed Care – PPO | Admitting: Internal Medicine

## 2016-08-20 ENCOUNTER — Telehealth: Payer: Self-pay | Admitting: Internal Medicine

## 2016-08-20 ENCOUNTER — Ambulatory Visit (INDEPENDENT_AMBULATORY_CARE_PROVIDER_SITE_OTHER): Payer: BC Managed Care – PPO | Admitting: Family

## 2016-08-20 ENCOUNTER — Encounter: Payer: Self-pay | Admitting: Family

## 2016-08-20 ENCOUNTER — Other Ambulatory Visit: Payer: BC Managed Care – PPO

## 2016-08-20 VITALS — BP 116/80 | HR 64 | Temp 98.1°F | Resp 16 | Ht 62.0 in | Wt 150.0 lb

## 2016-08-20 DIAGNOSIS — R300 Dysuria: Secondary | ICD-10-CM | POA: Diagnosis not present

## 2016-08-20 DIAGNOSIS — Z23 Encounter for immunization: Secondary | ICD-10-CM

## 2016-08-20 LAB — POCT URINALYSIS DIPSTICK
Bilirubin, UA: NEGATIVE
Glucose, UA: NEGATIVE
KETONES UA: NEGATIVE
Leukocytes, UA: NEGATIVE
Nitrite, UA: NEGATIVE
SPEC GRAV UA: 1.01
Urobilinogen, UA: NEGATIVE
pH, UA: 6

## 2016-08-20 NOTE — Telephone Encounter (Signed)
Pt came by and would like to know if you would be willing to take her on as a new pt?  She was a pt of Dr hoppers

## 2016-08-20 NOTE — Progress Notes (Signed)
Subjective:    Patient ID: [name], female    DOB: 11/26/1970, 45 y.o.   MRN: [id number]  Chief Complaint  Patient presents with  . Dysuria    dysuria x2 weeks    HPI:  [name] is a 45 y.o. female who  has a past medical history of AMA (advanced maternal age) multigravida 49+; Bipolar disorder ([place]); Gestational diabetes; HSV (herpes simplex virus) anogenital infection; and Vaginal Pap smear, abnormal. and presents today for an acute office visit.  This is a new problem. Associated symptom of dysuria has been going on for about 2 weeks. No fever, urinary frequency, urgency or back pain. Symptoms are slightly improved today. No vaginal discharge, burning, or itching. Denies any modifying factors that make it better or worse. She started her menstrual cycle within the last 24 hours.    No Known Allergies    Outpatient Medications Prior to Visit  Medication Sig Dispense Refill  . LamoTRIgine (LAMICTAL XR) 300 MG TB24 Take 300 mg by mouth daily.     Marland Kitchen lurasidone (LATUDA) 40 MG TABS tablet Take 40 mg by mouth daily with breakfast.    . OLANZapine (ZYPREXA) 15 MG tablet Take 15 mg by mouth at bedtime.    . Prenatal Vit-Fe Fumarate-FA (PRENATAL MULTIVITAMIN) TABS tablet Take 1 tablet by mouth daily at 12 noon.    . neomycin-polymyxin-hydrocortisone (CORTISPORIN) otic solution Place 4 drops into the right ear 4 (four) times daily. 10 mL 0  . oxyCODONE-acetaminophen (ROXICET) 5-325 MG per tablet Take 1-2 tablets by mouth every 4 (four) hours as needed for severe pain. 60 tablet 0   No facility-administered medications prior to visit.     Review of Systems  Constitutional: Negative for chills and fever.  Respiratory: Negative for chest tightness and shortness of breath.   Genitourinary: Positive for dysuria. Negative for frequency, hematuria and urgency.      Objective:    BP 116/80 (BP Location: Left Arm, Patient Position: Sitting, Cuff Size: Normal)   Pulse 64    Temp 98.1 F (36.7 C) (Oral)   Resp 16   Ht 5\' 2"  (1.575 m)   Wt 150 lb (68 kg)   SpO2 98%   BMI 27.44 kg/m  Nursing note and vital signs reviewed.  Physical Exam  Constitutional: She is oriented to person, place, and time. She appears well-developed and well-nourished. No distress.  Cardiovascular: Normal rate, regular rhythm, normal heart sounds and intact distal pulses.   Pulmonary/Chest: Effort normal and breath sounds normal.  Abdominal: There is no tenderness. There is no CVA tenderness.  Neurological: She is alert and oriented to person, place, and time.  Skin: Skin is warm and dry.  Psychiatric: She has a normal mood and affect. Her behavior is normal. Judgment and thought content normal.       Assessment & Plan:   Problem List Items Addressed This Visit      Other   Dysuria - Primary    In office urinalysis negative for leukocytes, nitrates, positive for blood most likely related to menstrual cycle. Obtain urine culture for confirmation. Given improvement of symptoms, consider watchful waiting with no antibiotic therapy pending urine culture results. Follow-up for worsening prior to urine culture as needed.      Relevant Orders   POCT urinalysis dipstick (Completed)   Urine culture    Other Visit Diagnoses    Encounter for immunization       Relevant Orders   Flu Vaccine  QUAD 36+ mos IM (Completed)       I have discontinued Ms. Odriscoll's oxyCODONE-acetaminophen and neomycin-polymyxin-hydrocortisone. I am also having her maintain her LamoTRIgine, prenatal multivitamin, OLANZapine, and lurasidone.   Follow-up: Return if symptoms worsen or fail to improve.  Mauricio Po, FNP

## 2016-08-20 NOTE — Patient Instructions (Signed)
Thank you for choosing [facility].  SUMMARY AND INSTRUCTIONS:  Your urine has been sent for culture. Continue to drink plenty of fluids.  Consider cranberry juice as needed.  If your culture is positive or your symptoms return/worsen we will send in an antibiotic.  Medication:  Please continue to take your medications as prescribed.    Follow up:  If your symptoms worsen or fail to improve, please contact our office for further instruction, or in case of emergency go directly to the emergency room at the closest medical facility.

## 2016-08-20 NOTE — Assessment & Plan Note (Signed)
In office urinalysis negative for leukocytes, nitrates, positive for blood most likely related to menstrual cycle. Obtain urine culture for confirmation. Given improvement of symptoms, consider watchful waiting with no antibiotic therapy pending urine culture results. Follow-up for worsening prior to urine culture as needed.

## 2016-08-21 ENCOUNTER — Other Ambulatory Visit: Payer: Self-pay | Admitting: Family

## 2016-08-21 LAB — URINE CULTURE: Organism ID, Bacteria: NO GROWTH

## 2016-08-21 MED ORDER — CIPROFLOXACIN HCL 500 MG PO TABS: 500.0000 mg | ORAL_TABLET | Freq: Two times a day (BID) | ORAL | 0 refills | Status: DC

## 2016-08-21 MED ORDER — PHENAZOPYRIDINE HCL 100 MG PO TABS: 100.0000 mg | ORAL_TABLET | Freq: Three times a day (TID) | ORAL | 0 refills | Status: DC | PRN

## 2016-08-21 NOTE — Telephone Encounter (Signed)
Please advise 

## 2016-08-22 NOTE — Telephone Encounter (Signed)
Yes I will accept him 

## 2016-08-24 ENCOUNTER — Encounter: Payer: Self-pay | Admitting: Family

## 2016-08-25 NOTE — Telephone Encounter (Signed)
Please contact pt to schedule appt.

## 2016-10-09 ENCOUNTER — Other Ambulatory Visit: Admission: RE | Admit: 2016-10-09 | Payer: BC Managed Care – PPO | Source: Ambulatory Visit | Admitting: Urology

## 2017-02-17 ENCOUNTER — Telehealth: Payer: Self-pay | Admitting: General Practice

## 2017-02-17 ENCOUNTER — Telehealth: Payer: Self-pay | Admitting: Family Medicine

## 2017-02-17 ENCOUNTER — Encounter: Payer: Self-pay | Admitting: Family Medicine

## 2017-02-17 ENCOUNTER — Ambulatory Visit (INDEPENDENT_AMBULATORY_CARE_PROVIDER_SITE_OTHER): Payer: BC Managed Care – PPO | Admitting: Family Medicine

## 2017-02-17 ENCOUNTER — Ambulatory Visit (INDEPENDENT_AMBULATORY_CARE_PROVIDER_SITE_OTHER): Payer: BC Managed Care – PPO

## 2017-02-17 VITALS — HR 81 | Temp 98.6°F | Wt 142.8 lb

## 2017-02-17 DIAGNOSIS — F17210 Nicotine dependence, cigarettes, uncomplicated: Secondary | ICD-10-CM | POA: Diagnosis not present

## 2017-02-17 DIAGNOSIS — R05 Cough: Secondary | ICD-10-CM

## 2017-02-17 DIAGNOSIS — R059 Cough, unspecified: Secondary | ICD-10-CM

## 2017-02-17 MED ORDER — ALBUTEROL SULFATE HFA 108 (90 BASE) MCG/ACT IN AERS: 2.0000 | INHALATION_SPRAY | Freq: Four times a day (QID) | RESPIRATORY_TRACT | 2 refills | Status: DC | PRN

## 2017-02-17 MED ORDER — BUDESONIDE-FORMOTEROL FUMARATE 160-4.5 MCG/ACT IN AERO: 2.0000 | INHALATION_SPRAY | Freq: Two times a day (BID) | RESPIRATORY_TRACT | 3 refills | Status: DC

## 2017-02-17 MED ORDER — AMOXICILLIN-POT CLAVULANATE 875-125 MG PO TABS: 1.0000 | ORAL_TABLET | Freq: Two times a day (BID) | ORAL | 0 refills | Status: DC

## 2017-02-17 MED ORDER — HYDROCODONE-HOMATROPINE 5-1.5 MG/5ML PO SYRP: 5.0000 mL | ORAL_SOLUTION | Freq: Every evening | ORAL | 0 refills | Status: DC | PRN

## 2017-02-17 MED ORDER — PREDNISONE 10 MG PO TABS: ORAL_TABLET | ORAL | 0 refills | Status: DC

## 2017-02-17 NOTE — Telephone Encounter (Signed)
°  Patient Name: [name]  DOB: 1971/06/20    Initial Comment Caller states she's having shortness of breath, chills.   Nurse Assessment  Nurse: Julien Girt, RN, Almyra Free Date/Time Eilene Ghazi Time): 02/17/2017 10:45:15 AM  Confirm and document reason for call. If symptomatic, describe symptoms. ---Caller states she has been coughing and been sob for the last 3 days. Adds she smokes 2 PPD but is trying to use the Nicotrol Inhaler. She has been coughing up yellow secretions , has had chills but no fever.  Does the patient have any new or worsening symptoms? ---Yes  Will a triage be completed? ---Yes  Related visit to physician within the last 2 weeks? ---No  Does the PT have any chronic conditions? (i.e. diabetes, asthma, etc.) ---Yes  List chronic conditions. ---Diabetes, Bipolar  Is the patient pregnant or possibly pregnant? (Ask all females between the ages of 71-55) ---No  Is this a behavioral health or substance abuse call? ---No     Guidelines    Guideline Title Affirmed Question Affirmed Notes  Cough - Acute Productive SEVERE coughing spells (e.g., whooping sound after coughing, vomiting after coughing)    Final Disposition User   See Physician within [place], RN, Almyra Free    Referrals  REFERRED TO PCP OFFICE   Disagree/Comply: Leta Baptist

## 2017-02-17 NOTE — Telephone Encounter (Signed)
Patient called attempting to make an appointment as a transfer patient from [facility] however is having shortness of breath, discolored urination and chills. Transferred the call to teamhealth to advise before scheduling patient. Awaiting teamhealth note.

## 2017-02-17 NOTE — Telephone Encounter (Signed)
Noted  

## 2017-02-17 NOTE — Telephone Encounter (Signed)
Noted. Awaiting [place] triage call

## 2017-02-17 NOTE — Telephone Encounter (Signed)
Noted. Patient scheduled to see Dr. Juleen China at 2:30pm today

## 2017-02-17 NOTE — Progress Notes (Signed)
[name] is a 46 y.o. female here for an acute visit.  History of Present Illness:  [name] CMA acting as scribe for Dr. Juleen China.  Cough  This is a new problem. The current episode started in the past 7 days. The problem has been rapidly worsening. The cough is productive of purulent sputum. Associated symptoms include chest pain, chills, headaches, postnasal drip and shortness of breath. Pertinent negatives include no ear pain, fever, sore throat or wheezing. The symptoms are aggravated by lying down. Risk factors for lung disease include smoking/tobacco exposure. She has tried nothing for the symptoms.   PMHx, SurgHx, SocialHx, Medications, and Allergies were reviewed in the Visit Navigator and updated as appropriate.  Current Medications:   Current Outpatient Prescriptions:  .  LamoTRIgine (LAMICTAL XR) 300 MG TB24, Take 300 mg by mouth daily. , Disp: , Rfl:  .  lurasidone (LATUDA) 40 MG TABS tablet, Take 40 mg by mouth daily with breakfast., Disp: , Rfl:  .  OLANZapine (ZYPREXA) 15 MG tablet, Take 15 mg by mouth at bedtime., Disp: , Rfl:  .  Prenatal Vit-Fe Fumarate-FA (PRENATAL MULTIVITAMIN) TABS tablet, Take 1 tablet by mouth daily at 12 noon., Disp: , Rfl:    No Known Allergies   Review of Systems:   Review of Systems  Constitutional: Positive for chills and malaise/fatigue. Negative for fever.  HENT: Positive for postnasal drip. Negative for ear pain, sinus pain and sore throat.   Eyes: Negative for blurred vision and double vision.  Respiratory: Positive for cough and shortness of breath. Negative for wheezing.   Cardiovascular: Positive for chest pain and palpitations. Negative for leg swelling.       With cough.   Gastrointestinal: Negative for abdominal pain, nausea and vomiting.  Musculoskeletal: Negative for back pain, joint pain and neck pain.  Neurological: Positive for dizziness and headaches.   Vitals:   Vitals:   02/17/17 1450  Pulse: 81    Temp: 98.6 F (37 C)  TempSrc: Oral  SpO2: 96%  Weight: 142 lb 12.8 oz (64.8 kg)     Body mass index is 26.12 kg/m.  Physical Exam:   Physical Exam  Constitutional: She appears well-developed and well-nourished. No distress.  HENT:  Head: Normocephalic and atraumatic.  Eyes: EOM are normal. Pupils are equal, round, and reactive to light.  Neck: Normal range of motion. Neck supple.  Cardiovascular: Normal rate, regular rhythm, normal heart sounds and intact distal pulses.   Pulmonary/Chest: Effort normal. No respiratory distress. She has wheezes.  Abdominal: Soft.  Skin: Skin is warm.  Psychiatric: She has a normal mood and affect. Her behavior is normal.  Nursing note and vitals reviewed.  EXAM: CHEST  2 VIEW  COMPARISON:  01/05/2012  FINDINGS: Cardiac shadow is within normal limits. The lungs are well aerated bilaterally. Mild interstitial changes are seen slightly accentuated from the prior exam consistent with chronic bronchitic changes. No focal infiltrate or sizable effusion is seen. No bony abnormality is noted.  IMPRESSION: Mild bronchitic changes likely of a chronic nature. No acute abnormality seen.  Results for orders placed or performed in visit on 02/17/17  CBC with Differential/Platelet  Result Value Ref Range   WBC 11.4 (H) 4.0 - 10.5 K/uL   RBC 4.47 3.87 - 5.11 Mil/uL   Hemoglobin 15.0 12.0 - 15.0 g/dL   HCT 43.8 36.0 - 46.0 %   MCV 97.8 78.0 - 100.0 fl   MCHC 34.3 30.0 - 36.0 g/dL  RDW 12.9 11.5 - 15.5 %   Platelets 291.0 150.0 - 400.0 K/uL   Neutrophils Relative % 73.5 43.0 - 77.0 %   Lymphocytes Relative 17.3 12.0 - 46.0 %   Monocytes Relative 8.0 3.0 - 12.0 %   Eosinophils Relative 0.5 0.0 - 5.0 %   Basophils Relative 0.7 0.0 - 3.0 %   Neutro Abs 8.4 (H) 1.4 - 7.7 K/uL   Lymphs Abs 2.0 0.7 - 4.0 K/uL   Monocytes Absolute 0.9 0.1 - 1.0 K/uL   Eosinophils Absolute 0.1 0.0 - 0.7 K/uL   Basophils Absolute 0.1 0.0 - 0.1 K/uL   Comprehensive metabolic panel  Result Value Ref Range   Sodium 136 135 - 145 mEq/L   Potassium 4.2 3.5 - 5.1 mEq/L   Chloride 103 96 - 112 mEq/L   CO2 24 19 - 32 mEq/L   Glucose, Bld 81 70 - 99 mg/dL   BUN 13 6 - 23 mg/dL   Creatinine, Ser 1.01 0.40 - 1.20 mg/dL   Total Bilirubin 0.4 0.2 - 1.2 mg/dL   Alkaline Phosphatase 61 39 - 117 U/L   AST 26 0 - 37 U/L   ALT 11 0 - 35 U/L   Total Protein 7.3 6.0 - 8.3 g/dL   Albumin 4.3 3.5 - 5.2 g/dL   Calcium 10.1 8.4 - 10.5 mg/dL   GFR 63.27 >60.00 mL/min    Assessment and Plan:   Myrah was seen today for cough.  Diagnoses and all orders for this visit:  Cough Comments: Reviewed treatment of COPD exacerbation. Red flags reviewed. Orders: -     DG Chest 2 View -     CBC with Differential/Platelet -     Comprehensive metabolic panel -     amoxicillin-clavulanate (AUGMENTIN) 875-125 MG tablet; Take 1 tablet by mouth 2 (two) times daily. -     predniSONE (DELTASONE) 10 MG tablet; 4-4-3-3-2-2-1-1-off -     albuterol (PROVENTIL HFA;VENTOLIN HFA) 108 (90 Base) MCG/ACT inhaler; Inhale 2 puffs into the lungs every 6 (six) hours as needed for wheezing or shortness of breath. -     budesonide-formoterol (SYMBICORT) 160-4.5 MCG/ACT inhaler; Inhale 2 puffs into the lungs 2 (two) times daily. -     HYDROcodone-homatropine (HYCODAN) 5-1.5 MG/5ML syrup; Take 5 mLs by mouth at bedtime as needed for cough.  Cigarette nicotine dependence without complication Comments: I advised patient to quit smoking, and offered support. [place] QUITLINE: 1-800-QUIT-NOW 520-878-7106).    . Reviewed expectations re: course of current medical issues. . Discussed self-management of symptoms. . Outlined signs and symptoms indicating need for more acute intervention. . Patient verbalized understanding and all questions were answered. Marland Kitchen Health Maintenance issues including appropriate healthy diet, exercise, and smoking avoidance were discussed with patient. . See  orders for this visit as documented in the electronic medical record. . Patient received an After Visit Summary.  CMA served as Education administrator during this visit. History, Physical, and Plan performed by medical provider. The above documentation has been reviewed and is accurate and complete. Briscoe Deutscher, D.O.  Briscoe Deutscher, DO [place], [place] 02/18/2017  No future appointments.

## 2017-02-18 ENCOUNTER — Encounter: Payer: Self-pay | Admitting: Family Medicine

## 2017-02-18 LAB — COMPREHENSIVE METABOLIC PANEL
ALT: 11 U/L (ref 0–35)
AST: 26 U/L (ref 0–37)
Albumin: 4.3 g/dL (ref 3.5–5.2)
Alkaline Phosphatase: 61 U/L (ref 39–117)
BUN: 13 mg/dL (ref 6–23)
CO2: 24 mEq/L (ref 19–32)
Calcium: 10.1 mg/dL (ref 8.4–10.5)
Chloride: 103 mEq/L (ref 96–112)
Creatinine, Ser: 1.01 mg/dL (ref 0.40–1.20)
GFR: 63.27 mL/min (ref >60.00)
Glucose, Bld: 81 mg/dL (ref 70–99)
Potassium: 4.2 mEq/L (ref 3.5–5.1)
Sodium: 136 mEq/L (ref 135–145)
Total Bilirubin: 0.4 mg/dL (ref 0.2–1.2)
Total Protein: 7.3 g/dL (ref 6.0–8.3)

## 2017-02-18 LAB — CBC WITH DIFFERENTIAL/PLATELET
Basophils Absolute: 0.1 10*3/uL (ref 0.0–0.1)
Basophils Relative: 0.7 % (ref 0.0–3.0)
Eosinophils Absolute: 0.1 10*3/uL (ref 0.0–0.7)
Eosinophils Relative: 0.5 % (ref 0.0–5.0)
HCT: 43.8 % (ref 36.0–46.0)
Hemoglobin: 15 g/dL (ref 12.0–15.0)
Lymphocytes Relative: 17.3 % (ref 12.0–46.0)
Lymphs Abs: 2 10*3/uL (ref 0.7–4.0)
MCHC: 34.3 g/dL (ref 30.0–36.0)
MCV: 97.8 fl (ref 78.0–100.0)
Monocytes Absolute: 0.9 10*3/uL (ref 0.1–1.0)
Monocytes Relative: 8 % (ref 3.0–12.0)
Neutro Abs: 8.4 10*3/uL — ABNORMAL HIGH (ref 1.4–7.7)
Neutrophils Relative %: 73.5 % (ref 43.0–77.0)
Platelets: 291 10*3/uL (ref 150.0–400.0)
RBC: 4.47 Mil/uL (ref 3.87–5.11)
RDW: 12.9 % (ref 11.5–15.5)
WBC: 11.4 10*3/uL — ABNORMAL HIGH (ref 4.0–10.5)

## 2017-02-19 ENCOUNTER — Telehealth: Payer: Self-pay | Admitting: Family Medicine

## 2017-02-19 NOTE — Telephone Encounter (Signed)
Left message for patient to return call.

## 2017-02-19 NOTE — Telephone Encounter (Signed)
Patient asking for return phone call when available.

## 2017-02-22 NOTE — Telephone Encounter (Signed)
LM for patient to return call.

## 2017-02-24 NOTE — Telephone Encounter (Signed)
Spoke with patient about lab results.

## 2017-03-17 ENCOUNTER — Ambulatory Visit (INDEPENDENT_AMBULATORY_CARE_PROVIDER_SITE_OTHER): Payer: BC Managed Care – PPO | Admitting: Family Medicine

## 2017-03-17 VITALS — BP 122/76 | HR 78 | Temp 98.6°F | Wt 145.6 lb

## 2017-03-17 DIAGNOSIS — R300 Dysuria: Secondary | ICD-10-CM | POA: Diagnosis not present

## 2017-03-17 DIAGNOSIS — N898 Other specified noninflammatory disorders of vagina: Secondary | ICD-10-CM

## 2017-03-17 LAB — POCT URINALYSIS DIPSTICK
Bilirubin, UA: NEGATIVE
Blood, UA: NEGATIVE
Glucose, UA: NEGATIVE
Ketones, UA: NEGATIVE
Nitrite, UA: NEGATIVE
Protein, UA: NEGATIVE
Spec Grav, UA: <=1.005 — AB (ref 1.010–1.025)
Urobilinogen, UA: 0.2 E.U./dL
pH, UA: 6 (ref 5.0–8.0)

## 2017-03-17 MED ORDER — FLUCONAZOLE 150 MG PO TABS: ORAL_TABLET | ORAL | 0 refills | Status: DC

## 2017-03-17 MED ORDER — CEPHALEXIN 500 MG PO CAPS: 500.0000 mg | ORAL_CAPSULE | Freq: Three times a day (TID) | ORAL | 0 refills | Status: DC

## 2017-03-17 NOTE — Progress Notes (Signed)
[name] is a 46 y.o. female here for an acute visit.  History of Present Illness:   [name] CMA acting as scribe for Dr. Juleen China.  HPI: Patient come in today for pain with urination for one week. She states that she has had yeast-like vaginal discharge for the week as well. She is currently breastfeeding. No pelvic pain or back pain. No fever. No concern for STIs.   PMHx, SurgHx, SocialHx, Medications, and Allergies were reviewed in the Visit Navigator and updated as appropriate.  Current Medications:   .  albuterol (PROVENTIL HFA;VENTOLIN HFA) 108 (90 Base) MCG/ACT inhaler, Inhale 2 puffs into the lungs every 6 (six) hours as needed for wheezing or shortness of breath., Disp: 1 Inhaler, Rfl: 2 .  budesonide-formoterol (SYMBICORT) 160-4.5 MCG/ACT inhaler, Inhale 2 puffs into the lungs 2 (two) times daily., Disp: 1 Inhaler, Rfl: 3 .  LamoTRIgine (LAMICTAL XR) 300 MG TB24, Take 300 mg by mouth daily. , Disp: , Rfl:  .  lurasidone (LATUDA) 40 MG TABS tablet, Take 40 mg by mouth daily with breakfast., Disp: , Rfl:  .  OLANZapine (ZYPREXA) 15 MG tablet, Take 15 mg by mouth at bedtime., Disp: , Rfl:  .  Prenatal Vit-Fe Fumarate-FA (PRENATAL MULTIVITAMIN) TABS tablet, Take 1 tablet by mouth daily at 12 noon., Disp: , Rfl:    No Known Allergies   Review of Systems:   Pertinent items are noted in the HPI. Otherwise, ROS is negative.  Vitals:   Vitals:   03/17/17 1326  BP: 122/76  Pulse: 78  Temp: 98.6 F (37 C)  TempSrc: Oral  SpO2: 96%  Weight: 145 lb 9.6 oz (66 kg)     Body mass index is 26.63 kg/m.  Physical Exam:   Physical Exam  Constitutional: She appears well-developed and well-nourished. No distress.  HENT:  Head: Normocephalic and atraumatic.  Eyes: Pupils are equal, round, and reactive to light. EOM are normal.  Neck: Normal range of motion. Neck supple.  Cardiovascular: Normal rate, regular rhythm, normal heart sounds and intact distal pulses.     Pulmonary/Chest: Effort normal.  Abdominal: Soft.  Skin: Skin is warm.  Psychiatric: She has a normal mood and affect. Her behavior is normal.  Nursing note and vitals reviewed.   Results for orders placed or performed in visit on 03/17/17  POCT urinalysis dipstick  Result Value Ref Range   Color, UA Yellow    Clarity, UA Clear    Glucose, UA Negative    Bilirubin, UA Negative    Ketones, UA Negative    Spec Grav, UA <=1.005 (A) 1.010 - 1.025   Blood, UA Negative    pH, UA 6.0 5.0 - 8.0   Protein, UA Negative    Urobilinogen, UA 0.2 0.2 or 1.0 E.U./dL   Nitrite, UA Negative    Leukocytes, UA Small (1+) (A) Negative   Assessment and Plan:   [name] was seen today for dysuria.  Diagnoses and all orders for this visit:  Dysuria -     POCT urinalysis dipstick -     Urine Culture -     cephALEXin (KEFLEX) 500 MG capsule; Take 1 capsule (500 mg total) by mouth 3 (three) times daily.  Vaginal discharge -     fluconazole (DIFLUCAN) 150 MG tablet; Take one tablet then three days later another.   . Reviewed expectations re: course of current medical issues. . Discussed self-management of symptoms. . Outlined signs and symptoms indicating need for more  acute intervention. . Patient verbalized understanding and all questions were answered. Marland Kitchen Health Maintenance issues including appropriate healthy diet, exercise, and smoking avoidance were discussed with patient. . See orders for this visit as documented in the electronic medical record. . Patient received an After Visit Summary.  CMA served as Education administrator during this visit. History, Physical, and Plan performed by medical provider. The above documentation has been reviewed and is accurate and complete. Briscoe Deutscher, D.O.  Briscoe Deutscher, DO [place], [place] 03/20/2017  Future Appointments Date Time Provider Department Center  04/12/2017 11:00 AM Stephanie Acre, RN [facility] None

## 2017-03-18 LAB — URINE CULTURE: Organism ID, Bacteria: NO GROWTH

## 2017-03-20 ENCOUNTER — Encounter: Payer: Self-pay | Admitting: Family Medicine

## 2017-04-12 ENCOUNTER — Ambulatory Visit: Payer: BC Managed Care – PPO | Admitting: *Deleted

## 2017-04-13 ENCOUNTER — Encounter: Payer: BC Managed Care – PPO | Admitting: Family Medicine

## 2017-04-27 ENCOUNTER — Encounter: Payer: BC Managed Care – PPO | Admitting: Family Medicine

## 2017-05-05 ENCOUNTER — Emergency Department (HOSPITAL_COMMUNITY): Payer: BC Managed Care – PPO

## 2017-05-05 ENCOUNTER — Encounter (HOSPITAL_COMMUNITY): Payer: Self-pay | Admitting: *Deleted

## 2017-05-05 ENCOUNTER — Emergency Department (HOSPITAL_COMMUNITY)
Admission: EM | Admit: 2017-05-05 | Discharge: 2017-05-05 | Disposition: A | Discharge status: Home / Self Care | Payer: BC Managed Care – PPO | Attending: Emergency Medicine | Admitting: Emergency Medicine

## 2017-05-05 DIAGNOSIS — Z79899 Other long term (current) drug therapy: Secondary | ICD-10-CM | POA: Insufficient documentation

## 2017-05-05 DIAGNOSIS — N83202 Unspecified ovarian cyst, left side: Secondary | ICD-10-CM | POA: Insufficient documentation

## 2017-05-05 DIAGNOSIS — N3000 Acute cystitis without hematuria: Secondary | ICD-10-CM | POA: Diagnosis not present

## 2017-05-05 DIAGNOSIS — F17200 Nicotine dependence, unspecified, uncomplicated: Secondary | ICD-10-CM | POA: Diagnosis not present

## 2017-05-05 DIAGNOSIS — R1031 Right lower quadrant pain: Secondary | ICD-10-CM | POA: Diagnosis present

## 2017-05-05 LAB — CBC
HEMATOCRIT: 40.6 % (ref 36.0–46.0)
Hemoglobin: 14.2 g/dL (ref 12.0–15.0)
MCH: 33.2 pg (ref 26.0–34.0)
MCHC: 35 g/dL (ref 30.0–36.0)
MCV: 94.9 fL (ref 78.0–100.0)
Platelets: 274 10*3/uL (ref 150–400)
RBC: 4.28 MIL/uL (ref 3.87–5.11)
RDW: 13 % (ref 11.5–15.5)
WBC: 10.7 10*3/uL — ABNORMAL HIGH (ref 4.0–10.5)

## 2017-05-05 LAB — URINALYSIS, ROUTINE W REFLEX MICROSCOPIC
Bilirubin Urine: NEGATIVE
GLUCOSE, UA: NEGATIVE mg/dL
Hgb urine dipstick: NEGATIVE
KETONES UR: 20 mg/dL — AB
NITRITE: NEGATIVE
PH: 7 (ref 5.0–8.0)
Protein, ur: NEGATIVE mg/dL
Specific Gravity, Urine: 1.008 (ref 1.005–1.030)

## 2017-05-05 LAB — COMPREHENSIVE METABOLIC PANEL
ALT: 15 U/L (ref 14–54)
AST: 22 U/L (ref 15–41)
Albumin: 4 g/dL (ref 3.5–5.0)
Alkaline Phosphatase: 42 U/L (ref 38–126)
Anion gap: 13 (ref 5–15)
BILIRUBIN TOTAL: 0.8 mg/dL (ref 0.3–1.2)
BUN: 6 mg/dL (ref 6–20)
CHLORIDE: 104 mmol/L (ref 101–111)
CO2: 22 mmol/L (ref 22–32)
CREATININE: 1.05 mg/dL — AB (ref 0.44–1.00)
Calcium: 9.4 mg/dL (ref 8.9–10.3)
Glucose, Bld: 88 mg/dL (ref 65–99)
Potassium: 3.6 mmol/L (ref 3.5–5.1)
Sodium: 139 mmol/L (ref 135–145)
TOTAL PROTEIN: 7.1 g/dL (ref 6.5–8.1)

## 2017-05-05 LAB — WET PREP, GENITAL
Clue Cells Wet Prep HPF POC: NONE SEEN
SPERM: NONE SEEN
Trich, Wet Prep: NONE SEEN
Yeast Wet Prep HPF POC: NONE SEEN

## 2017-05-05 LAB — LIPASE, BLOOD: LIPASE: 32 U/L (ref 11–51)

## 2017-05-05 LAB — PREGNANCY, URINE: Preg Test, Ur: NEGATIVE

## 2017-05-05 MED ORDER — ONDANSETRON HCL 4 MG/2ML IJ SOLN
4.0000 mg | Freq: Once | INTRAMUSCULAR | Status: DC
  Filled 2017-05-05: qty 2

## 2017-05-05 MED ORDER — OXYCODONE-ACETAMINOPHEN 5-325 MG PO TABS
1.0000 | ORAL_TABLET | Freq: Once | ORAL | Status: AC
  Administered 2017-05-05: 1 via ORAL
  Filled 2017-05-05: qty 1

## 2017-05-05 MED ORDER — CEPHALEXIN 500 MG PO CAPS: 500.0000 mg | ORAL_CAPSULE | Freq: Two times a day (BID) | ORAL | 0 refills | Status: DC

## 2017-05-05 MED ORDER — IOPAMIDOL (ISOVUE-300) INJECTION 61%
INTRAVENOUS | Status: AC
  Administered 2017-05-05: 100 mL
  Filled 2017-05-05: qty 100

## 2017-05-05 MED ORDER — HYDROMORPHONE HCL 1 MG/ML IJ SOLN
1.0000 mg | Freq: Once | INTRAMUSCULAR | Status: AC
  Administered 2017-05-05: 1 mg via INTRAVENOUS
  Filled 2017-05-05: qty 1

## 2017-05-05 MED ORDER — ONDANSETRON HCL 4 MG/2ML IJ SOLN
4.0000 mg | Freq: Once | INTRAMUSCULAR | Status: AC
  Administered 2017-05-05: 4 mg via INTRAVENOUS
  Filled 2017-05-05: qty 2

## 2017-05-05 MED ORDER — DEXTROSE 5 % IV SOLN
1.0000 g | Freq: Once | INTRAVENOUS | Status: AC
  Administered 2017-05-05: 1 g via INTRAVENOUS
  Filled 2017-05-05: qty 10

## 2017-05-05 MED ORDER — OXYCODONE-ACETAMINOPHEN 5-325 MG PO TABS: 1.0000 | ORAL_TABLET | ORAL | 0 refills | Status: DC | PRN

## 2017-05-05 MED ORDER — MORPHINE SULFATE (PF) 4 MG/ML IV SOLN
4.0000 mg | Freq: Once | INTRAVENOUS | Status: AC
  Administered 2017-05-05: 4 mg via INTRAVENOUS
  Filled 2017-05-05: qty 1

## 2017-05-05 MED ORDER — SODIUM CHLORIDE 0.9 % IV BOLUS (SEPSIS)
1000.0000 mL | Freq: Once | INTRAVENOUS | Status: AC
  Administered 2017-05-05: 1000 mL via INTRAVENOUS

## 2017-05-05 NOTE — ED Provider Notes (Signed)
[facility] DEPT Provider Note   CSN: [id number] Arrival date & time: 05/05/17  1051     History   Chief Complaint Chief Complaint  Patient presents with  . Abdominal Pain    HPI [name] is a 46 y.o. female.  HPI 46 year old Caucasian female with no significant past medical history presents to the emergency department today with complaints of right lower quadrant abdominal pain. Patient reports associated diarrhea, nausea, vomiting. The patient states that her pain started last night in the right lower quadrant. Does not radiate. States that she had one episode of nonbloody nonbilious emesis last night. Reports 3-4 watery stools this morning. Patient is not taking anything for her symptoms prior to arrival. Nothing makes better or worse. Describes the pain as cramping and sharp in nature it was intermittent but now constant. States that when the pain came on last night she had some diaphoresis with it. States that she was seen at urgent care earlier today prior to coming to the ED and they were concerned for possible appendicitis and was sent to the the ED for evaluation. The patient denies any sick contacts, recent new foods, recent travel, recent antibiotic use. Denies any associated melena, hematochezia, fevers.  Of note patient also complains of clear vaginal discharge without odor for the past week. States that she is sexually active with her husband. Denies any concerns for STDs.  Pt denies any fever, chill, ha, vision changes, lightheadedness, dizziness, congestion, neck pain, cp, sob, cough, urinary symptoms, vaginal bleeding,  change in bowel habits, melena, hematochezia, lower extremity paresthesias.  Past Medical History:  Diagnosis Date  . AMA (advanced maternal age) multigravida 36+   . Bipolar disorder ([place])   . Gestational diabetes    previous pregnancy  . HSV (herpes simplex virus) anogenital infection   . Vaginal Pap smear, abnormal     Patient Active  Problem List   Diagnosis Date Noted  . Dysuria 08/20/2016  . Indication for care in labor or delivery 06/28/2014  . Pregnancy 06/28/2014  . Renal insufficiency, mild 06/21/2013  . Other and unspecified hyperlipidemia 05/12/2012  . GESTATIONAL DIABETES 10/13/2010  . FATIGUE 10/13/2010  . BIPOLAR DISORDER UNSPECIFIED 08/29/2010  . CIGARETTE SMOKER 08/29/2010  . CHRONIC AIRWAY OBSTRUCTION NEC 08/29/2010  . PULMONARY NODULE, LEFT LOWER LOBE 08/29/2010    Past Surgical History:  Procedure Laterality Date  . ANKLE SURGERY     reconstructive, bilateral  . CESAREAN SECTION     x2  . CESAREAN SECTION N/A 06/28/2014   Procedure: CESAREAN SECTION;  Surgeon: Olga Millers, MD;  Location: [facility];  Service: Obstetrics;  Laterality: N/A;  . LEEP      OB History    Gravida Para Term Preterm AB Living   3 3 2 1   3    SAB TAB Ectopic Multiple Live Births           3       Home Medications    Prior to Admission medications   Medication Sig Start Date End Date Taking? Authorizing Provider  albuterol (PROVENTIL HFA;VENTOLIN HFA) 108 (90 Base) MCG/ACT inhaler Inhale 2 puffs into the lungs every 6 (six) hours as needed for wheezing or shortness of breath. 02/17/17   Briscoe Deutscher, DO  budesonide-formoterol (SYMBICORT) 160-4.5 MCG/ACT inhaler Inhale 2 puffs into the lungs 2 (two) times daily. 02/17/17   Briscoe Deutscher, DO  cephALEXin (KEFLEX) 500 MG capsule Take 1 capsule (500 mg total) by mouth 3 (three) times daily.  03/17/17   Briscoe Deutscher, DO  fluconazole (DIFLUCAN) 150 MG tablet Take one tablet then three days later another. 03/17/17   Briscoe Deutscher, DO  HYDROcodone-homatropine (HYCODAN) 5-1.5 MG/5ML syrup Take 5 mLs by mouth at bedtime as needed for cough. 02/17/17   Briscoe Deutscher, DO  LamoTRIgine (LAMICTAL XR) 300 MG TB24 Take 300 mg by mouth daily.     [provider]  lurasidone (LATUDA) 40 MG TABS tablet Take 40 mg by mouth daily with breakfast.    [provider]    OLANZapine (ZYPREXA) 15 MG tablet Take 15 mg by mouth at bedtime.    [provider]  Prenatal Vit-Fe Fumarate-FA (PRENATAL MULTIVITAMIN) TABS tablet Take 1 tablet by mouth daily at 12 noon.    [provider]    Family History Family History  Problem Relation Age of Onset  . Diabetes Father        2  . Cancer Maternal Grandmother        pancreatic  . Hypertension Maternal Grandfather   . Heart disease Maternal Grandfather     Social History Social History  Substance Use Topics  . Smoking status: Current Every Day Smoker    Packs/day: 0.50  . Smokeless tobacco: Never Used  . Alcohol use No     Allergies   Patient has no known allergies.   Review of Systems Review of Systems  Constitutional: Negative for chills and fever.  HENT: Negative for congestion.   Eyes: Negative for visual disturbance.  Respiratory: Negative for cough and shortness of breath.   Cardiovascular: Negative for chest pain.  Gastrointestinal: Positive for abdominal pain, diarrhea, nausea and vomiting. Negative for blood in stool and constipation.  Genitourinary: Positive for vaginal discharge. Negative for dysuria, flank pain, frequency, hematuria, urgency and vaginal bleeding.  Musculoskeletal: Negative for arthralgias and myalgias.  Skin: Negative for rash.  Neurological: Negative for dizziness, syncope, weakness, light-headedness, numbness and headaches.  Psychiatric/Behavioral: Negative for sleep disturbance. The patient is not nervous/anxious.      Physical Exam Updated Vital Signs BP 127/84   Pulse 76   Temp 98.1 F (36.7 C) (Oral)   Resp 16   Ht 5\' 2"  (1.575 m)   Wt 66.2 kg (146 lb)   LMP 04/21/2017 (Approximate)   SpO2 98%   Breastfeeding? Yes   BMI 26.70 kg/m   Physical Exam  Constitutional: She is oriented to person, place, and time. She appears well-developed and well-nourished.  Non-toxic appearance. No distress.  HENT:  Head: Normocephalic and  atraumatic.  Nose: Nose normal.  Mouth/Throat: Oropharynx is clear and moist.  Eyes: Pupils are equal, round, and reactive to light. Conjunctivae are normal. Right eye exhibits no discharge. Left eye exhibits no discharge.  Neck: Normal range of motion. Neck supple.  Cardiovascular: Normal rate, regular rhythm, normal heart sounds and intact distal pulses.  Exam reveals no gallop and no friction rub.   No murmur heard. Pulmonary/Chest: Effort normal and breath sounds normal. No respiratory distress. She has no wheezes. She has no rales. She exhibits no tenderness.  Abdominal: Soft. Bowel sounds are normal. There is tenderness in the right lower quadrant and left upper quadrant. There is no rigidity, no rebound, no guarding, no CVA tenderness, no tenderness at McBurney's point and negative Murphy's sign.  Genitourinary:  Genitourinary Comments: Chaperone present for exam. No external lesions, swelling, erythema, or rash of the labia. Thin green discharge without odor noted. No erythema, , bleeding, or lesions noted in the vaginal  vault. No CMT tenderness, bleeding or friability. Bilateral adnexal tenderness to palpation. No inguinal adenopathy or hernia.    Musculoskeletal: Normal range of motion. She exhibits no tenderness.  Lymphadenopathy:    She has no cervical adenopathy.  Neurological: She is alert and oriented to person, place, and time.  Skin: Skin is warm and dry. Capillary refill takes less than 2 seconds.  Psychiatric: Her behavior is normal. Judgment and thought content normal.  Nursing note and vitals reviewed.    ED Treatments / Results  Labs (all labs ordered are listed, but only abnormal results are displayed) Labs Reviewed  COMPREHENSIVE METABOLIC PANEL - Abnormal; Notable for the following:       Result Value   Creatinine, Ser 1.05 (*)    All other components within normal limits  CBC - Abnormal; Notable for the following:    WBC 10.7 (*)    All other components  within normal limits  URINALYSIS, ROUTINE W REFLEX MICROSCOPIC - Abnormal; Notable for the following:    APPearance HAZY (*)    Ketones, ur 20 (*)    Leukocytes, UA LARGE (*)    Bacteria, UA RARE (*)    Squamous Epithelial / LPF 0-5 (*)    All other components within normal limits  URINE CULTURE  WET PREP, GENITAL  LIPASE, BLOOD  PREGNANCY, URINE  GC/CHLAMYDIA PROBE AMP ([place]) NOT AT [facility]    EKG  EKG Interpretation None       Radiology US Transvaginal Non-ob  Result Date: 05/05/2017 CLINICAL DATA:  RIGHT lower quadrant pain.  Follow-up abnormal CT. EXAM: TRANSABDOMINAL AND TRANSVAGINAL ULTRASOUND OF PELVIS TECHNIQUE: Both transabdominal and transvaginal ultrasound examinations of the pelvis were performed. Transabdominal technique was performed for global imaging of the pelvis including uterus, ovaries, adnexal regions, and pelvic cul-de-sac. It was necessary to proceed with endovaginal exam following the transabdominal exam to visualize the adnexa. COMPARISON:  CT abdomen and pelvis May 05, 2017 at 1534 hours FINDINGS: Uterus Measurements: 8.7 x 4.2 x 4.1 cm. No fibroids or other mass visualized. 11 mm avascular hypoechoic probable nabothian cyst lower uterine segment with increased through transmission. Endometrium Thickness: 8 mm.  No focal abnormality visualized. Right ovary Measurements: 2.5 x 1.9 x 1.5 cm. Normal appearance/no adnexal mass. Left ovary Measurements: 3.6 x 2.3 x 2.7 cm. Complex 2.4 x 1.6 x 1.9 cm LEFT ovarian mass with cystic central component, irregular solid walls. Other findings Small amount of free fluid LEFT pelvis. IMPRESSION: Complex 2.4 x 1.6 x 1.9 cm LEFT ovarian cystic mass, most consistent with an involuting corpus luteal cyst/hemorrhagic cyst. Recommend follow-up pelvic ultrasound in 6-12 weeks to ensure resolution. Electronically Signed   By: Elon Alas M.D.   On: 05/05/2017 17:51   US Pelvis Complete  Result Date: 05/05/2017 CLINICAL  DATA:  RIGHT lower quadrant pain.  Follow-up abnormal CT. EXAM: TRANSABDOMINAL AND TRANSVAGINAL ULTRASOUND OF PELVIS TECHNIQUE: Both transabdominal and transvaginal ultrasound examinations of the pelvis were performed. Transabdominal technique was performed for global imaging of the pelvis including uterus, ovaries, adnexal regions, and pelvic cul-de-sac. It was necessary to proceed with endovaginal exam following the transabdominal exam to visualize the adnexa. COMPARISON:  CT abdomen and pelvis May 05, 2017 at 1534 hours FINDINGS: Uterus Measurements: 8.7 x 4.2 x 4.1 cm. No fibroids or other mass visualized. 11 mm avascular hypoechoic probable nabothian cyst lower uterine segment with increased through transmission. Endometrium Thickness: 8 mm.  No focal abnormality visualized. Right ovary Measurements: 2.5 x 1.9 x  1.5 cm. Normal appearance/no adnexal mass. Left ovary Measurements: 3.6 x 2.3 x 2.7 cm. Complex 2.4 x 1.6 x 1.9 cm LEFT ovarian mass with cystic central component, irregular solid walls. Other findings Small amount of free fluid LEFT pelvis. IMPRESSION: Complex 2.4 x 1.6 x 1.9 cm LEFT ovarian cystic mass, most consistent with an involuting corpus luteal cyst/hemorrhagic cyst. Recommend follow-up pelvic ultrasound in 6-12 weeks to ensure resolution. Electronically Signed   By: Elon Alas M.D.   On: 05/05/2017 17:51   Ct Abdomen Pelvis W Contrast  Result Date: 05/05/2017 CLINICAL DATA:  Right lower quadrant abdominal pain. EXAM: CT ABDOMEN AND PELVIS WITH CONTRAST TECHNIQUE: Multidetector CT imaging of the abdomen and pelvis was performed using the standard protocol following bolus administration of intravenous contrast. CONTRAST:  120mL ISOVUE-300 IOPAMIDOL (ISOVUE-300) INJECTION 61% COMPARISON:  CT of the abdomen and pelvis 06/25/2010 FINDINGS: Lower chest: The lung bases are clear without focal nodule, mass, or airspace disease. Hepatobiliary: No focal liver abnormality is seen. No  gallstones, gallbladder wall thickening, or biliary dilatation. Pancreas: Unremarkable. No pancreatic ductal dilatation or surrounding inflammatory changes. Spleen: Normal in size without focal abnormality. Adrenals/Urinary Tract: The adrenal glands are normal bilaterally. Kidneys and ureters are unremarkable. The urinary bladder wall is diffusely thickened. No discrete mass lesion is present. Stomach/Bowel: The stomach and duodenum are within normal limits. Small bowel is unremarkable. The appendix is visualized and normal. The ascending and transverse colon are within normal limits. The descending and sigmoid colon are normal. Vascular/Lymphatic: No significant vascular findings are present. No enlarged abdominal or pelvic lymph nodes. Reproductive: Uterus is unremarkable. Right adnexae are within normal limits. A peripherally enhancing collection is noted in the left adnexa. This collection is somewhat irregular, measuring 10 x 26 x 21 mm. A second peripherally enhancing collection is noted just anterior to the lower uterine segment measuring 21 x 9 x 17 mm. There is asymmetric enhancement along the left anterior aspect of the lower uterine segment extending to the left adnexa. Moderate free fluid is present within the anatomic pelvis. The uterine fundus is within normal limits. Other: No significant ventral hernia is present. Musculoskeletal: A ventral calcified disc is present at L1-2. There is a limbus for abrupt along the superior endplate of L5. No acute abnormalities are present. IMPRESSION: 1. Peripherally enhancing collections involving the left adnexae and just anterior to lower uterine segment. While this could represent collapsed follicles, this raises concern for pelvic inflammatory disease with inflammatory phlegmon. Pelvic ultrasound would be useful further evaluation. 2. Moderate free fluid within the anatomic pelvis. 3. Diffuse bladder wall thickening is concerning for inflammation. This may be  related to urinary tract infection. Electronically Signed   By: San Morelle M.D.   On: 05/05/2017 15:50   [place] Art/ven Flow Abd Pelv Doppler  Result Date: 05/05/2017 CLINICAL DATA:  RIGHT lower quadrant and pelvic pain since last night, abnormal CT EXAM: TRANSABDOMINAL AND TRANSVAGINAL ULTRASOUND OF PELVIS DOPPLER ULTRASOUND OF OVARIES TECHNIQUE: Both transabdominal and transvaginal ultrasound examinations of the pelvis were performed. Transabdominal technique was performed for global imaging of the pelvis including uterus, ovaries, adnexal regions, and pelvic cul-de-sac. It was necessary to proceed with endovaginal exam following the transabdominal exam to visualize the ovaries. Color and duplex Doppler ultrasound was utilized to evaluate blood flow to the ovaries. COMPARISON:  CT abdomen and pelvis 05/05/2017 FINDINGS: Uterus Measurements: 8.7 x 4.2 x 4.1 cm. Well-defined hypoechoic nodule at inferior uterine segment/ upper cervix, potentially a large nabothian cyst  10 x 11 x 15 mm. Endometrium Thickness: 8 mm thick.  No endometrial fluid or focal abnormality Right ovary Measurements: 2.5 x 1.9 x 1.5 cm. Normal morphology without mass. Internal blood flow present on color Doppler imaging. Left ovary Measurements: 3.6 x 2.3 x 2.7 cm. Small resolving corpus luteum 2.4 x 1.6 x 1.9 cm. Blood flow present within LEFT ovary on color Doppler imaging. Pulsed Doppler evaluation of both ovaries demonstrates normal low-resistance arterial and venous waveforms. Other findings Small amount of free fluid in LEFT pelvis adjacent to LEFT ovary. No other adnexal masses. IMPRESSION: Small amount of LEFT free pelvic fluid with a resolving corpus luteum in the LEFT ovary. No other significant pelvic sonographic abnormalities identified. Specifically no evidence of ovarian torsion. Electronically Signed   By: Lavonia Dana M.D.   On: 05/05/2017 17:46    Procedures Procedures (including critical care time)  Medications  Ordered in ED Medications  cefTRIAXone (ROCEPHIN) 1 g in dextrose 5 % 50 mL IVPB (1 g Intravenous New Bag/Given 05/05/17 1638)  sodium chloride 0.9 % bolus 1,000 mL (0 mLs Intravenous Stopped 05/05/17 1639)  morphine 4 MG/ML injection 4 mg (4 mg Intravenous Given 05/05/17 1440)  ondansetron (ZOFRAN) injection 4 mg (4 mg Intravenous Given 05/05/17 1439)  iopamidol (ISOVUE-300) 61 % injection (100 mLs  Contrast Given 05/05/17 1525)  HYDROmorphone (DILAUDID) injection 1 mg (1 mg Intravenous Given 05/05/17 1639)     Initial Impression / Assessment and Plan / ED Course  I have reviewed the triage vital signs and the nursing notes.  Pertinent labs & imaging results that were available during my care of the patient were reviewed by me and considered in my medical decision making (see chart for details).    Patient presents to the ED with complaints of right lower quadrant abdominal pain, diarrhea, nausea, vomiting. Seen at urgent care prior to ED evaluation for possible appendicitis.  Patient is overall well-appearing and nontoxic. Vital signs are reassuring. Patient is afebrile, no tachycardia, no hypotension.  Patient with significant abdominal tenderness in the right lower quadrant. No rebound noted. Negative Rovsing sign. Lungs clear to auscultation bilaterally.  Labwork is reassuring. White blood cell count is 10,000. Mild elevation in creatinine of 1.05. Electrolytes are normal. Urine pregnant test is negative. UA consistent with acute UTI. Wet prep obtained that showed many wbc's and no other abnormalities. Urine culture is pending.  CT scan was obtained of the abdomen that showed normal appendix.  Peripherally enhancing collections involving the left adnexae and just anterior to lower uterine segment. While this could represent collapsed follicles, this raises concern for pelvic inflammatory disease with inflammatory phlegmon. Pelvic ultrasound would be useful further evaluation. 2. Moderate free  fluid within the anatomic pelvis. 3. Diffuse bladder wall thickening is concerning for inflammation. This may be related to urinary tract infection.  Patient started on IV Rocephin.  Given these findings the patient would benefit from a pelvic ultrasound. Pain has been managed in the ED.  Care handoff to [place]. Pt has pending at this time [place], wet prep, ob/gyn consult, iv abx.  Disposition likely home pending lab and test results. If pt is d/c will need abx for uti and likely ob/gyn follow up. Care dicussed and plan agreed upon with oncoming PA. Pt updated on plan of care and is currently hemodynamically stable at this time with normal vs.    Dicussed with Dr Ashok Cordia.     Final Clinical Impressions(s) / ED Diagnoses   Final  diagnoses:  None    New Prescriptions New Prescriptions   No medications on file     Aaron Edelman 05/06/17 1811    Lajean Saver, MD 05/10/17 [phone]

## 2017-05-05 NOTE — ED Notes (Signed)
Patient transported to Ultrasound 

## 2017-05-05 NOTE — ED Triage Notes (Signed)
Pt c/o RLQ abd onset last night, pt reports x 1 vomiting episodes, reports liquid stool x3-4 times, pt sent her by [facility] for eval, A&O 4

## 2017-05-05 NOTE — ED Provider Notes (Signed)
PROGRESS NOTE                                                                                                                 This is a sign-out from [place] at shift change: [name] is a 46 y.o. female presenting with right-sided lower quadrant pain with nausea vomiting and diarrhea worsening of the course last day. Patient afebrile, sent from urgent care for evaluation of appendicitis. CT with normal appendix however they do see a left-sided adnexal lesion concerning for phlegmon. Patient is pending ultrasound, she is being treated for UTI with Rocephin.  Please refer to previous note for full HPI, ROS, PMH and PE.   Ultrasound with a likely hemorrhagic left-sided 2 cm ovarian cyst with normal blood flow. There is a moderate amount of free fluid in the pelvis. No anemia on CBC. OB/GYN consult from Dr. Philis Pique appreciated agreed with stability to discharge home we have had an extended discussion of return precautions for anemia and patient verbalizes understanding and teach back technique.  Repeat abdominal exam is nonsurgical. Discharged home with Percocet and Keflex for UTI. Urine culture pending.       [name] 05/05/17 2154    Lajean Saver, MD 05/06/17 [phone]

## 2017-05-05 NOTE — Discharge Instructions (Signed)
For pain control please take ibuprofen (also known as Motrin or Advil) 800mg  (this is normally 4 over the counter pills) 3 times a day  for 5 days. Take with food to minimize stomach irritation.  Take percocet for breakthrough pain, do not drink alcohol, drive, care for children or do other critical tasks while taking percocet.  Please follow with your OB/GYN for recheck in one week.  If the pain becomes severe, UV develop chest pain, shortness of breath, palpitations or feeling to going to pass out he can call 911 or be transported to [facility] hospital for further evaluation.

## 2017-05-06 LAB — GC/CHLAMYDIA PROBE AMP (~~LOC~~) NOT AT ARMC
CHLAMYDIA, DNA PROBE: NEGATIVE
NEISSERIA GONORRHEA: NEGATIVE

## 2017-05-07 ENCOUNTER — Inpatient Hospital Stay (HOSPITAL_COMMUNITY): Payer: BC Managed Care – PPO

## 2017-05-07 ENCOUNTER — Encounter (HOSPITAL_COMMUNITY): Payer: Self-pay | Admitting: *Deleted

## 2017-05-07 ENCOUNTER — Inpatient Hospital Stay (HOSPITAL_COMMUNITY)
Admission: AD | Admit: 2017-05-07 | Discharge: 2017-05-08 | Disposition: A | Discharge status: Home / Self Care | Payer: BC Managed Care – PPO | Source: Ambulatory Visit | Attending: Obstetrics & Gynecology | Admitting: Obstetrics & Gynecology

## 2017-05-07 DIAGNOSIS — R109 Unspecified abdominal pain: Secondary | ICD-10-CM | POA: Diagnosis present

## 2017-05-07 DIAGNOSIS — Z3202 Encounter for pregnancy test, result negative: Secondary | ICD-10-CM | POA: Insufficient documentation

## 2017-05-07 DIAGNOSIS — R1084 Generalized abdominal pain: Secondary | ICD-10-CM | POA: Diagnosis not present

## 2017-05-07 DIAGNOSIS — F17210 Nicotine dependence, cigarettes, uncomplicated: Secondary | ICD-10-CM | POA: Diagnosis not present

## 2017-05-07 DIAGNOSIS — R1030 Lower abdominal pain, unspecified: Secondary | ICD-10-CM | POA: Diagnosis not present

## 2017-05-07 DIAGNOSIS — N29 Other disorders of kidney and ureter in diseases classified elsewhere: Secondary | ICD-10-CM | POA: Insufficient documentation

## 2017-05-07 LAB — URINE CULTURE: CULTURE: NO GROWTH

## 2017-05-07 LAB — CBC
HCT: 37.1 % (ref 36.0–46.0)
HEMOGLOBIN: 13.2 g/dL (ref 12.0–15.0)
MCH: 33.8 pg (ref 26.0–34.0)
MCHC: 35.6 g/dL (ref 30.0–36.0)
MCV: 95.1 fL (ref 78.0–100.0)
PLATELETS: 244 10*3/uL (ref 150–400)
RBC: 3.9 MIL/uL (ref 3.87–5.11)
RDW: 13.1 % (ref 11.5–15.5)
WBC: 9.3 10*3/uL (ref 4.0–10.5)

## 2017-05-07 LAB — WET PREP, GENITAL
CLUE CELLS WET PREP: NONE SEEN
SPERM: NONE SEEN
Trich, Wet Prep: NONE SEEN
Yeast Wet Prep HPF POC: NONE SEEN

## 2017-05-07 LAB — URINALYSIS, ROUTINE W REFLEX MICROSCOPIC
Bilirubin Urine: NEGATIVE
GLUCOSE, UA: NEGATIVE mg/dL
HGB URINE DIPSTICK: NEGATIVE
Ketones, ur: NEGATIVE mg/dL
Nitrite: NEGATIVE
PH: 6 (ref 5.0–8.0)
PROTEIN: NEGATIVE mg/dL
RBC / HPF: NONE SEEN RBC/hpf (ref 0–5)
SPECIFIC GRAVITY, URINE: 1.002 — AB (ref 1.005–1.030)

## 2017-05-07 LAB — POCT PREGNANCY, URINE: PREG TEST UR: NEGATIVE

## 2017-05-07 MED ORDER — KETOROLAC TROMETHAMINE 60 MG/2ML IM SOLN
60.0000 mg | Freq: Once | INTRAMUSCULAR | Status: AC
  Administered 2017-05-07: 60 mg via INTRAMUSCULAR
  Filled 2017-05-07: qty 2

## 2017-05-07 NOTE — MAU Provider Note (Signed)
History   Patient seen and examined at bedside. She is a 46 yo P3 WF who has had acute abdominal pain  Since Tuesday. She was seen at [facility] on 9/5 to r/o appendicitis and results of the CT scan showed possible right hemorrhagic cyst, free fluid in cul de sac and possible PID appearance of adnexa.  Patient denies fever, chills, she was nauseous on Tuesday / wed, but she is no longer nauseous. She reports flatus and regular bowel movements. She is a patient of Freda Munro.  No Noted h/o ovarian cysts or fibroids.  Pelvic US today was negative.   CSN: [id number]  Arrival date & time 05/07/17  1805   First Provider Initiated Contact with Patient 05/07/17 1938      Chief Complaint  Patient presents with  . Abdominal Pain  . Bloated    HPI  Past Medical History:  Diagnosis Date  . Bipolar disorder ([place])   . Gestational diabetes    previous pregnancy  . HSV (herpes simplex virus) anogenital infection   . Vaginal Pap smear, abnormal     Past Surgical History:  Procedure Laterality Date  . ANKLE SURGERY     reconstructive, bilateral  . CESAREAN SECTION     x2  . CESAREAN SECTION N/A 06/28/2014   Procedure: CESAREAN SECTION;  Surgeon: Olga Millers, MD;  Location: [facility];  Service: Obstetrics;  Laterality: N/A;  . LEEP      Family History  Problem Relation Age of Onset  . Diabetes Father        2  . Cancer Maternal Grandmother        pancreatic  . Hypertension Maternal Grandfather   . Heart disease Maternal Grandfather     Social History  Substance Use Topics  . Smoking status: Current Every Day Smoker    Packs/day: 0.50  . Smokeless tobacco: Never Used  . Alcohol use No    OB History    Gravida Para Term Preterm AB Living   3 3 2 1   3    SAB TAB Ectopic Multiple Live Births           3      Review of Systems  Allergies  Patient has no known allergies.  Home Medications    BP 124/75 (BP Location: Left Arm)   Pulse 65   Temp 98.7 F (37.1 C)    Resp 18   LMP 04/21/2017 (Approximate)   SpO2 99%   Physical Exam  Abdomen:  Soft, Non-tender, non-distended, no rebound, no guarding  MAU Course  Procedures (including critical care time)  Labs Reviewed  WET PREP, GENITAL - Abnormal; Notable for the following:       Result Value   WBC, Wet Prep HPF POC MANY (*)    All other components within normal limits  URINALYSIS, ROUTINE W REFLEX MICROSCOPIC - Abnormal; Notable for the following:    Color, Urine STRAW (*)    Specific Gravity, Urine 1.002 (*)    Leukocytes, UA MODERATE (*)    Bacteria, UA RARE (*)    Squamous Epithelial / LPF 0-5 (*)    All other components within normal limits  CBC  POCT PREGNANCY, URINE   Dg Abdomen 1 View  Result Date: 05/07/2017 CLINICAL DATA:  Sharp pain in the lower abdomen with vomiting EXAM: ABDOMEN - 1 VIEW COMPARISON:  05/05/2017 FINDINGS: Diffuse bilateral calcifications over the kidneys consistent with medullary nephrocalcinosis. Nonobstructed bowel-gas pattern with moderate stool in  the colon. IMPRESSION: 1. Nonobstructed bowel-gas pattern 2. Multiple bilateral calcifications overlying the kidneys consistent with medullary nephrocalcinosis Electronically Signed   By: Donavan Foil M.D.   On: 05/07/2017 21:43   US Pelvis Transvanginal Non-ob (tv Only)  Result Date: 05/07/2017 CLINICAL DATA:  Patient with sharp abdominal pain. EXAM: TRANSABDOMINAL ULTRASOUND OF PELVIS TECHNIQUE: Transabdominal ultrasound examination of the pelvis was performed including evaluation of the uterus, ovaries, adnexal regions, and pelvic cul-de-sac. COMPARISON:  Pelvic ultrasound 05/05/2017; CT 05/05/2017. FINDINGS: Uterus Measurements: 8.3 x 4.6 x 5.0 cm. No fibroids or other mass visualized. Within the anterior lower uterine segment there is a 1.6 x 1.1 x 1.8 cm cystic area. Endometrium Thickness: 9 mm.  No focal abnormality visualized. Right ovary Measurements: 2.5 x 1.6 x 1.5 cm. Normal appearance/no adnexal mass. Left  ovary Measurements: 3.1 x 2.1 x 2.6 cm. There is a 1.7 x 1.2 x 1.4 cm corpus luteum within the left ovary. Other findings:  Small amount of free fluid in the pelvis. IMPRESSION: Nonspecific cystic lesion within the anterior lower uterine segment may represent sequelae of prior cesarean section or potentially peripherally located nabothian cyst. Corpus luteum left ovary. No significant free fluid in the pelvis. Electronically Signed   By: Lovey Newcomer M.D.   On: 05/07/2017 21:01     1. 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

## 2017-05-07 NOTE — MAU Provider Note (Signed)
History     CSN: [id number]  Arrival date and time: 05/07/17 1805  First Provider Initiated Contact with Patient 05/07/17 1938      Chief Complaint  Patient presents with  . Abdominal Pain  . Bloated   HPI [name] is a 46 y.o. [phone] non pregnant female who presents with abdominal pain. Was seen at [facility] on 9/5 with same complaints.  Diagnosed with hemorrhagic cyst & UTI, discharged on abx & pain meds.Lower abdominal pain was initially worse in RLQ but since Wednesday is throughout abdomen. Rates pain 7/10. Pain worse with walking & leaning over. Has been taking percocet & ibuprofen with minimal relief. Reports increase in vaginal discharge & abdominal distension "that makes her look pregnant". Had 2 loose BMs on Wednesday & 1 small formed movement this morning. Denies fever/chills, n/v, dysuria, vaginal bleeding, dyspareunia, or post coital bleeding.    Past Medical History:  Diagnosis Date  . Bipolar disorder ([place])   . Gestational diabetes    previous pregnancy  . HSV (herpes simplex virus) anogenital infection   . Vaginal Pap smear, abnormal     Past Surgical History:  Procedure Laterality Date  . ANKLE SURGERY     reconstructive, bilateral  . CESAREAN SECTION     x2  . CESAREAN SECTION N/A 06/28/2014   Procedure: CESAREAN SECTION;  Surgeon: Olga Millers, MD;  Location: [facility];  Service: Obstetrics;  Laterality: N/A;  . LEEP      Family History  Problem Relation Age of Onset  . Diabetes Father        2  . Cancer Maternal Grandmother        pancreatic  . Hypertension Maternal Grandfather   . Heart disease Maternal Grandfather     Social History  Substance Use Topics  . Smoking status: Current Every Day Smoker    Packs/day: 0.50  . Smokeless tobacco: Never Used  . Alcohol use No    Allergies: No Known Allergies  Prescriptions Prior to Admission  Medication Sig Dispense Refill Last Dose  . cephALEXin (KEFLEX) 500 MG capsule Take 1 capsule (500 mg  total) by mouth 2 (two) times daily. 20 capsule 0 05/07/2017 at Unknown time  . ibuprofen (ADVIL,MOTRIN) 200 MG tablet Take 800 mg by mouth every 6 (six) hours as needed.   05/07/2017 at Unknown time  . LamoTRIgine (LAMICTAL XR) 300 MG TB24 Take 300 mg by mouth daily.    05/07/2017 at Unknown time  . lurasidone (LATUDA) 40 MG TABS tablet Take 40 mg by mouth daily with breakfast.   05/06/2017 at Unknown time  . OLANZapine (ZYPREXA) 15 MG tablet Take 15 mg by mouth at bedtime.   05/06/2017 at Unknown time  . oxyCODONE-acetaminophen (PERCOCET) 5-325 MG tablet Take 1 tablet by mouth every 4 (four) hours as needed. 13 tablet 0 05/07/2017 at 1345  . Prenatal Vit-Fe Fumarate-FA (PRENATAL MULTIVITAMIN) TABS tablet Take 1 tablet by mouth daily at 12 noon.   05/07/2017 at Unknown time  . albuterol (PROVENTIL HFA;VENTOLIN HFA) 108 (90 Base) MCG/ACT inhaler Inhale 2 puffs into the lungs every 6 (six) hours as needed for wheezing or shortness of breath. 1 Inhaler 2 Taking  . budesonide-formoterol (SYMBICORT) 160-4.5 MCG/ACT inhaler Inhale 2 puffs into the lungs 2 (two) times daily. 1 Inhaler 3 Taking  . fluconazole (DIFLUCAN) 150 MG tablet Take one tablet then three days later another. 2 tablet 0   . HYDROcodone-homatropine (HYCODAN) 5-1.5 MG/5ML syrup Take 5 mLs by mouth  at bedtime as needed for cough. 120 mL 0 Taking    Review of Systems  Constitutional: Negative for appetite change, chills and fever.  Gastrointestinal: Positive for abdominal distention and abdominal pain. Negative for blood in stool, constipation, diarrhea, nausea and vomiting.  Genitourinary: Positive for vaginal discharge. Negative for dyspareunia, dysuria and vaginal bleeding.   Physical Exam   Blood pressure 133/79, pulse 70, temperature 98.6 F (37 C), resp. rate 18, last menstrual period 04/21/2017, SpO2 99 %, currently breastfeeding.  Physical Exam  Nursing note and vitals reviewed. Constitutional: She is oriented to person, place, and time.  She appears well-developed and well-nourished. No distress.  HENT:  Head: Normocephalic and atraumatic.  Eyes: Conjunctivae are normal. Right eye exhibits no discharge. Left eye exhibits no discharge. No scleral icterus.  Neck: Normal range of motion.  Cardiovascular: Normal rate, regular rhythm and normal heart sounds.   No murmur heard. Respiratory: Effort normal and breath sounds normal. No respiratory distress. She has no wheezes.  GI: Soft. Bowel sounds are normal. She exhibits distension. She exhibits no mass. There is tenderness in the periumbilical area and suprapubic area. There is rebound. There is no guarding and no CVA tenderness.  Genitourinary: Uterus is tender. Uterus is not deviated, not enlarged and not fixed. Cervix exhibits no motion tenderness and no friability. Right adnexum displays no mass and no tenderness. Left adnexum displays fullness. Left adnexum displays no mass and no tenderness. No bleeding in the vagina. Vaginal discharge (small amount of yellow/green mucoid discharge) found.  Neurological: She is alert and oriented to person, place, and time.  Skin: Skin is warm and dry. She is not diaphoretic.  Psychiatric: She has a normal mood and affect. Her behavior is normal. Judgment and thought content normal.    MAU Course  Procedures Results for orders placed or performed during the hospital encounter of 05/07/17 (from the past 24 hour(s))  Urinalysis, Routine w reflex microscopic     Status: Abnormal   Collection Time: 05/07/17  6:18 PM  Result Value Ref Range   Color, Urine STRAW (A) YELLOW   APPearance CLEAR CLEAR   Specific Gravity, Urine 1.002 (L) 1.005 - 1.030   pH 6.0 5.0 - 8.0   Glucose, UA NEGATIVE NEGATIVE mg/dL   Hgb urine dipstick NEGATIVE NEGATIVE   Bilirubin Urine NEGATIVE NEGATIVE   Ketones, ur NEGATIVE NEGATIVE mg/dL   Protein, ur NEGATIVE NEGATIVE mg/dL   Nitrite NEGATIVE NEGATIVE   Leukocytes, UA MODERATE (A) NEGATIVE   RBC / HPF NONE SEEN  0 - 5 RBC/hpf   WBC, UA 0-5 0 - 5 WBC/hpf   Bacteria, UA RARE (A) NONE SEEN   Squamous Epithelial / LPF 0-5 (A) NONE SEEN  CBC     Status: None   Collection Time: 05/07/17  6:37 PM  Result Value Ref Range   WBC 9.3 4.0 - 10.5 K/uL   RBC 3.90 3.87 - 5.11 MIL/uL   Hemoglobin 13.2 12.0 - 15.0 g/dL   HCT 37.1 36.0 - 46.0 %   MCV 95.1 78.0 - 100.0 fL   MCH 33.8 26.0 - 34.0 pg   MCHC 35.6 30.0 - 36.0 g/dL   RDW 13.1 11.5 - 15.5 %   Platelets 244 150 - 400 K/uL  Pregnancy, urine POC     Status: None   Collection Time: 05/07/17  7:04 PM  Result Value Ref Range   Preg Test, Ur NEGATIVE NEGATIVE  Wet prep, genital     Status: Abnormal  Collection Time: 05/07/17  8:05 PM  Result Value Ref Range   Yeast Wet Prep HPF POC NONE SEEN NONE SEEN   Trich, Wet Prep NONE SEEN NONE SEEN   Clue Cells Wet Prep HPF POC NONE SEEN NONE SEEN   WBC, Wet Prep HPF POC MANY (A) NONE SEEN   Sperm NONE SEEN    Dg Abdomen 1 View  Result Date: 05/07/2017 CLINICAL DATA:  Sharp pain in the lower abdomen with vomiting EXAM: ABDOMEN - 1 VIEW COMPARISON:  05/05/2017 FINDINGS: Diffuse bilateral calcifications over the kidneys consistent with medullary nephrocalcinosis. Nonobstructed bowel-gas pattern with moderate stool in the colon. IMPRESSION: 1. Nonobstructed bowel-gas pattern 2. Multiple bilateral calcifications overlying the kidneys consistent with medullary nephrocalcinosis Electronically Signed   By: Donavan Foil M.D.   On: 05/07/2017 21:43   US Pelvis Transvanginal Non-ob (tv Only)  Result Date: 05/07/2017 CLINICAL DATA:  Patient with sharp abdominal pain. EXAM: TRANSABDOMINAL ULTRASOUND OF PELVIS TECHNIQUE: Transabdominal ultrasound examination of the pelvis was performed including evaluation of the uterus, ovaries, adnexal regions, and pelvic cul-de-sac. COMPARISON:  Pelvic ultrasound 05/05/2017; CT 05/05/2017. FINDINGS: Uterus Measurements: 8.3 x 4.6 x 5.0 cm. No fibroids or other mass visualized. Within the  anterior lower uterine segment there is a 1.6 x 1.1 x 1.8 cm cystic area. Endometrium Thickness: 9 mm.  No focal abnormality visualized. Right ovary Measurements: 2.5 x 1.6 x 1.5 cm. Normal appearance/no adnexal mass. Left ovary Measurements: 3.1 x 2.1 x 2.6 cm. There is a 1.7 x 1.2 x 1.4 cm corpus luteum within the left ovary. Other findings:  Small amount of free fluid in the pelvis. IMPRESSION: Nonspecific cystic lesion within the anterior lower uterine segment may represent sequelae of prior cesarean section or potentially peripherally located nabothian cyst. Corpus luteum left ovary. No significant free fluid in the pelvis. Electronically Signed   By: Lovey Newcomer M.D.   On: 05/07/2017 21:01    MDM UPT negative CBC --- no leukocytosis & hemoglobin stable. VSS, NAD GC/CT & urine culture negative from [facility] visit Repeat wet prep d/t increase in vaginal discharge Toradol 60 mg IM--- reports moderate relief in pain although still more painful with walking & bending over Ultrasound comparable to previous study 2 days ago, left cyst slightly decreased in size Discussed results with Dr. Alwyn Pea. Abdominal xray ordered. Will call with results.   Care turned over to [facility] Jorje Guild, NP 05/07/2017 9:28 PM  2305: Dr. Alwyn Pea in the [place] notified that patient is wondering about the plan.  Dr. Alwyn Pea on the unit. Discussing plan with the patient. Plan is for DC home with pain medication and simethicone.   Marcille Buffy 12:02 AM 05/08/17   Assessment and Plan   1. Generalized abdominal pain   2. Abdominal pain    DC home Comfort measures reviewed  RX:  Tramadol, simethicone.  Return to MAU as needed FU with GYN as planned

## 2017-05-07 NOTE — MAU Note (Signed)
Pt signed release form and walked outside to smoke. Now back in room

## 2017-05-07 NOTE — MAU Note (Signed)
Urine in the lab  

## 2017-05-07 NOTE — MAU Note (Signed)
Tues night, had sharp pain in lower abd.  Threw up and had diarrhea.    Was seen [facility], sent to [facility], had a CT exam (not appy).  Found a cyst in left ovary.  Sent home with Percocet.  The pain has continued.  Didn't take meds this morning,  Went into work for an hour.  abd has got very swollen/- looks preg- abd is tender all over, meds have not helped.  Also dx with a UTI

## 2017-05-08 DIAGNOSIS — R1084 Generalized abdominal pain: Secondary | ICD-10-CM | POA: Diagnosis not present

## 2017-05-08 MED ORDER — TRAMADOL HCL 50 MG PO TABS: 50.0000 mg | ORAL_TABLET | Freq: Four times a day (QID) | ORAL | 0 refills | Status: DC | PRN

## 2017-05-08 MED ORDER — SIMETHICONE 80 MG PO TABS: 1.0000 | ORAL_TABLET | Freq: Three times a day (TID) | ORAL | 3 refills | Status: DC

## 2017-05-08 NOTE — MAU Note (Signed)
Dr Alwyn Pea in to see pt

## 2017-05-08 NOTE — Progress Notes (Signed)
Written and verbal d/c instructions given and understanding vocied

## 2017-09-23 ENCOUNTER — Ambulatory Visit: Payer: BC Managed Care – PPO | Admitting: Family Medicine

## 2017-09-23 ENCOUNTER — Encounter: Payer: Self-pay | Admitting: Family Medicine

## 2017-09-23 VITALS — BP 130/68 | HR 77 | Temp 98.1°F | Wt 135.4 lb

## 2017-09-23 DIAGNOSIS — R05 Cough: Secondary | ICD-10-CM

## 2017-09-23 DIAGNOSIS — F17210 Nicotine dependence, cigarettes, uncomplicated: Secondary | ICD-10-CM | POA: Diagnosis not present

## 2017-09-23 DIAGNOSIS — R059 Cough, unspecified: Secondary | ICD-10-CM

## 2017-09-23 MED ORDER — CEPHALEXIN 500 MG PO CAPS: 500.0000 mg | ORAL_CAPSULE | Freq: Three times a day (TID) | ORAL | 0 refills | Status: DC

## 2017-09-23 MED ORDER — BUDESONIDE-FORMOTEROL FUMARATE 160-4.5 MCG/ACT IN AERO: 2.0000 | INHALATION_SPRAY | Freq: Two times a day (BID) | RESPIRATORY_TRACT | 3 refills | Status: DC

## 2017-09-23 MED ORDER — HYDROCODONE-HOMATROPINE 5-1.5 MG/5ML PO SYRP: 5.0000 mL | ORAL_SOLUTION | Freq: Every evening | ORAL | 0 refills | Status: DC | PRN

## 2017-09-23 MED ORDER — ALBUTEROL SULFATE HFA 108 (90 BASE) MCG/ACT IN AERS: 2.0000 | INHALATION_SPRAY | Freq: Four times a day (QID) | RESPIRATORY_TRACT | 2 refills | Status: DC | PRN

## 2017-09-23 NOTE — Progress Notes (Signed)
[name] is a 47 y.o. female here for an acute visit.  History of Present Illness:   Lonell Grandchild, CMA acting as scribe for Dr. Briscoe Deutscher.   Cough  This is a new problem. The current episode started in the past 7 days. The problem has been unchanged. The cough is productive of sputum. Associated symptoms include headaches, rhinorrhea and sweats. Pertinent negatives include no ear pain. The treatment provided no relief.   PMHx, SurgHx, SocialHx, Medications, and Allergies were reviewed in the Visit Navigator and updated as appropriate.  Current Medications:   .  albuterol (PROVENTIL HFA;VENTOLIN HFA) 108 (90 Base) MCG/ACT inhaler, Inhale 2 puffs into the lungs every 6 (six) hours as needed for wheezing or shortness of breath. (Patient not taking: Reported on 05/07/2017), Disp: 1 Inhaler, Rfl: 2 .  ibuprofen (ADVIL,MOTRIN) 200 MG tablet, Take 800 mg by mouth every 6 (six) hours as needed for moderate pain. , Disp: , Rfl:  .  LamoTRIgine (LAMICTAL XR) 300 MG TB24, Take 300 mg by mouth daily. , Disp: , Rfl:  .  lurasidone (LATUDA) 40 MG TABS tablet, Take 40 mg by mouth at bedtime. , Disp: , Rfl:  .  nicotine (NICOTROL) 10 MG inhaler, Inhale 1 Cartridge into the lungs daily as needed for smoking cessation. , Disp: , Rfl:  .  OLANZapine (ZYPREXA) 10 MG tablet, Take 15-20 mg by mouth at bedtime. Depending on sleep pattern, Disp: , Rfl:  .  oxyCODONE-acetaminophen (PERCOCET) 5-325 MG tablet, Take 1 tablet by mouth every 4 (four) hours as needed. (Patient taking differently: Take 1 tablet by mouth every 4 (four) hours as needed for moderate pain. ), Disp: 13 tablet, Rfl: 0 .  Prenatal Vit-Fe Fumarate-FA (PRENATAL MULTIVITAMIN) TABS tablet, Take 1 tablet by mouth daily at 12 noon., Disp: , Rfl:  .  Simethicone 80 MG TABS, Take 1 tablet (80 mg total) by mouth 3 (three) times daily., Disp: 30 tablet, Rfl: 3  No Known Allergies   Review of Systems:   Pertinent items are noted in the  HPI. Otherwise, ROS is negative.  Vitals:   Vitals:   09/23/17 1407  BP: 130/68  Pulse: 77  Temp: 98.1 F (36.7 C)  TempSrc: Oral  SpO2: 98%  Weight: 135 lb 6.4 oz (61.4 kg)     Body mass index is 24.76 kg/m.  Physical Exam:   Physical Exam  Constitutional: She is oriented to person, place, and time. She appears well-developed and well-nourished. No distress.  HENT:  Head: Normocephalic and atraumatic.  Right Ear: External ear normal.  Left Ear: External ear normal.  Nose: Mucosal edema and rhinorrhea present.  Mouth/Throat: Oropharynx is clear and moist.  Eyes: Conjunctivae and EOM are normal. Pupils are equal, round, and reactive to light.  Neck: Normal range of motion. Neck supple. No thyromegaly present.  Cardiovascular: Normal rate, regular rhythm, normal heart sounds and intact distal pulses.  Pulmonary/Chest: Effort normal. She has decreased breath sounds. She has wheezes. She has no rhonchi.  Abdominal: Soft. Bowel sounds are normal.  Musculoskeletal: Normal range of motion.  Lymphadenopathy:    She has no cervical adenopathy.  Neurological: She is alert and oriented to person, place, and time.  Skin: Skin is warm and dry. Capillary refill takes less than 2 seconds.  Psychiatric: She has a normal mood and affect. Her behavior is normal.  Nursing note and vitals reviewed.   Assessment and Plan:   1. Cough Asthma/COPD exacerbation in this smoker.  She does not use her maintenance inhalers. Reviewed the importance of smoking cessation. Treatment as below. Patient is still breastfeeding her 47 year old at night. Precautions discussed.  - albuterol (PROVENTIL HFA;VENTOLIN HFA) 108 (90 Base) MCG/ACT inhaler; Inhale 2 puffs into the lungs every 6 (six) hours as needed for wheezing or shortness of breath.  Dispense: 1 Inhaler; Refill: 2 - budesonide-formoterol (SYMBICORT) 160-4.5 MCG/ACT inhaler; Inhale 2 puffs into the lungs 2 (two) times daily.  Dispense: 1 Inhaler;  Refill: 3 - HYDROcodone-homatropine (HYCODAN) 5-1.5 MG/5ML syrup; Take 5 mLs by mouth at bedtime as needed for cough.  Dispense: 120 mL; Refill: 0 - cephALEXin (KEFLEX) 500 MG capsule; Take 1 capsule (500 mg total) by mouth 3 (three) times daily.  Dispense: 30 capsule; Refill: 0 - budesonide-formoterol (SYMBICORT) 160-4.5 MCG/ACT inhaler; Inhale 2 puffs into the lungs 2 (two) times daily.  Dispense: 1 Inhaler; Refill: 3  2. Cigarette nicotine dependence without complication I advised patient to quit smoking, and offered support. [place] QUITLINE: 1-800-QUIT-NOW 330-339-2119). She is still in the pre-contemplative stage of change.    . Reviewed expectations re: course of current medical issues. . Discussed self-management of symptoms. . Outlined signs and symptoms indicating need for more acute intervention. . Patient verbalized understanding and all questions were answered. Marland Kitchen Health Maintenance issues including appropriate healthy diet, exercise, and smoking avoidance were discussed with patient. . See orders for this visit as documented in the electronic medical record. . Patient received an After Visit Summary.  CMA served as Education administrator during this visit. History, Physical, and Plan performed by medical provider. The above documentation has been reviewed and is accurate and complete. Briscoe Deutscher, D.O.   Briscoe Deutscher, DO [place], [facility] 09/23/2017

## 2017-10-04 ENCOUNTER — Encounter: Payer: BC Managed Care – PPO | Admitting: Family Medicine

## 2017-10-11 ENCOUNTER — Encounter: Payer: Self-pay | Admitting: Family Medicine

## 2017-11-05 ENCOUNTER — Encounter: Payer: BC Managed Care – PPO | Admitting: Family Medicine

## 2017-12-14 ENCOUNTER — Encounter: Payer: Self-pay | Admitting: Family Medicine

## 2017-12-14 ENCOUNTER — Ambulatory Visit (INDEPENDENT_AMBULATORY_CARE_PROVIDER_SITE_OTHER): Payer: BC Managed Care – PPO | Admitting: Family Medicine

## 2017-12-14 VITALS — BP 120/68 | HR 94 | Temp 98.6°F | Ht 62.0 in | Wt 133.8 lb

## 2017-12-14 DIAGNOSIS — Z79899 Other long term (current) drug therapy: Secondary | ICD-10-CM

## 2017-12-14 DIAGNOSIS — Z0000 Encounter for general adult medical examination without abnormal findings: Secondary | ICD-10-CM

## 2017-12-14 DIAGNOSIS — Z13220 Encounter for screening for lipoid disorders: Secondary | ICD-10-CM

## 2017-12-14 DIAGNOSIS — F17200 Nicotine dependence, unspecified, uncomplicated: Secondary | ICD-10-CM | POA: Insufficient documentation

## 2017-12-14 DIAGNOSIS — B009 Herpesviral infection, unspecified: Secondary | ICD-10-CM | POA: Insufficient documentation

## 2017-12-14 DIAGNOSIS — F17210 Nicotine dependence, cigarettes, uncomplicated: Secondary | ICD-10-CM | POA: Diagnosis not present

## 2017-12-14 MED ORDER — VARENICLINE TARTRATE 0.5 MG X 11 & 1 MG X 42 PO MISC: ORAL | 0 refills | Status: DC

## 2017-12-14 NOTE — Progress Notes (Signed)
Subjective:    [name] is a 47 y.o. female and is here for a comprehensive physical exam.  There are no preventive care reminders to display for this patient.  PMHx, SurgHx, SocialHx, Medications, and Allergies were reviewed in the Visit Navigator and updated as appropriate.   Past Medical History:  Diagnosis Date  . Bipolar disorder ([place])   . Gestational diabetes   . HSV (herpes simplex virus) anogenital infection   . Vaginal Pap smear, abnormal     Past Surgical History:  Procedure Laterality Date  . ANKLE RECONSTRUCTION Bilateral   . CESAREAN SECTION    . CESAREAN SECTION N/A 06/28/2014   Procedure: CESAREAN SECTION;  Surgeon: Olga Millers, MD;  Location: [facility];  Service: Obstetrics;  Laterality: N/A;  . LEEP      Family History  Problem Relation Age of Onset  . Diabetes Father        2  . Pancreatic cancer Maternal Grandmother   . Hypertension Maternal Grandfather   . Heart disease Maternal Grandfather    Social History   Tobacco Use  . Smoking status: Current Every Day Smoker    Packs/day: 0.50  . Smokeless tobacco: Never Used  Substance Use Topics  . Alcohol use: No  . Drug use: No    Review of Systems:   Pertinent items are noted in the HPI. Otherwise, ROS is negative.  Objective:   BP 120/68   Pulse 94   Temp 98.6 F (37 C) (Oral)   Ht 5\' 2"  (1.575 m)   Wt 133 lb 12.8 oz (60.7 kg)   LMP 12/10/2017   SpO2 99%   BMI 24.47 kg/m    Wt Readings from Last 3 Encounters:  12/14/17 133 lb 12.8 oz (60.7 kg)  09/23/17 135 lb 6.4 oz (61.4 kg)  05/05/17 146 lb (66.2 kg)     Ht Readings from Last 3 Encounters:  12/14/17 5\' 2"  (1.575 m)  05/05/17 5\' 2"  (1.575 m)  08/20/16 5\' 2"  (1.575 m)   General appearance: alert, cooperative and appears stated age. Head: normocephalic, without obvious abnormality, atraumatic. Neck: no adenopathy, supple, symmetrical, trachea midline; thyroid not enlarged, symmetric, no  tenderness/mass/nodules. Lungs: clear to auscultation bilaterally. Heart: regular rate and rhythm Abdomen: soft, non-tender; no masses,  no organomegaly. Extremities: extremities normal, atraumatic, no cyanosis or edema. Skin: skin color, texture, turgor normal, no rashes or lesions. Lymph: cervical, supraclavicular, and axillary nodes normal; no abnormal inguinal nodes palpated. Neurologic: grossly normal.  Assessment/Plan:   [name] was seen today for annual exam.  Diagnoses and all orders for this visit:  Routine physical examination  Screening for lipid disorders -     Lipid panel  Medication management -     CBC with Differential/Platelet -     Comprehensive metabolic panel  Cigarette nicotine dependence without complication -     varenicline (CHANTIX STARTING MONTH PAK) 0.5 MG X 11 & 1 MG X 42 tablet; Take one 0.5 mg tablet by mouth once daily for 3 days, then increase to one 0.5 mg tablet twice daily for 4 days, then increase to one 1 mg tablet twice daily.   Patient Counseling: [x]    Nutrition: Stressed importance of moderation in sodium/caffeine intake, saturated fat and cholesterol, caloric balance, sufficient intake of fresh fruits, vegetables, fiber, calcium, iron, and 1 mg of folate supplement per day (for females capable of pregnancy).  [x]    Stressed the importance of regular exercise.   [x]   Substance Abuse: Discussed cessation/primary prevention of tobacco, alcohol, or other drug use; driving or other dangerous activities under the influence; availability of treatment for abuse.   [x]    Injury prevention: Discussed safety belts, safety helmets, smoke detector, smoking near bedding or upholstery.   [x]    Sexuality: Discussed sexually transmitted diseases, partner selection, use of condoms, avoidance of unintended pregnancy  and contraceptive alternatives.  [x]    Dental health: Discussed importance of regular tooth brushing, flossing, and dental visits.  [x]    Health  maintenance and immunizations reviewed. Please refer to Health maintenance section.   Briscoe Deutscher, DO [place]

## 2017-12-15 LAB — CBC WITH DIFFERENTIAL/PLATELET
Basophils Absolute: 0 10*3/uL (ref 0.0–0.1)
Basophils Relative: 0.4 % (ref 0.0–3.0)
Eosinophils Absolute: 0.1 10*3/uL (ref 0.0–0.7)
Eosinophils Relative: 1.3 % (ref 0.0–5.0)
HCT: 41.3 % (ref 36.0–46.0)
Hemoglobin: 13.8 g/dL (ref 12.0–15.0)
Lymphocytes Relative: 38.8 % (ref 12.0–46.0)
Lymphs Abs: 3.1 10*3/uL (ref 0.7–4.0)
MCHC: 33.5 g/dL (ref 30.0–36.0)
MCV: 95.7 fl (ref 78.0–100.0)
Monocytes Absolute: 0.7 10*3/uL (ref 0.1–1.0)
Monocytes Relative: 8.2 % (ref 3.0–12.0)
Neutro Abs: 4.1 10*3/uL (ref 1.4–7.7)
Neutrophils Relative %: 51.3 % (ref 43.0–77.0)
Platelets: 341 10*3/uL (ref 150.0–400.0)
RBC: 4.31 Mil/uL (ref 3.87–5.11)
RDW: 13.3 % (ref 11.5–15.5)
WBC: 8 10*3/uL (ref 4.0–10.5)

## 2017-12-15 LAB — COMPREHENSIVE METABOLIC PANEL
ALT: 14 U/L (ref 0–35)
AST: 19 U/L (ref 0–37)
Albumin: 4.1 g/dL (ref 3.5–5.2)
Alkaline Phosphatase: 34 U/L — ABNORMAL LOW (ref 39–117)
BUN: 12 mg/dL (ref 6–23)
CO2: 29 mEq/L (ref 19–32)
Calcium: 9.7 mg/dL (ref 8.4–10.5)
Chloride: 104 mEq/L (ref 96–112)
Creatinine, Ser: 1.12 mg/dL (ref 0.40–1.20)
GFR: 55.95 mL/min — ABNORMAL LOW (ref >60.00)
Glucose, Bld: 92 mg/dL (ref 70–99)
Potassium: 3.9 mEq/L (ref 3.5–5.1)
Sodium: 143 mEq/L (ref 135–145)
Total Bilirubin: 0.3 mg/dL (ref 0.2–1.2)
Total Protein: 6.5 g/dL (ref 6.0–8.3)

## 2017-12-15 LAB — LIPID PANEL
Cholesterol: 193 mg/dL (ref 0–200)
HDL: 92.8 mg/dL (ref >39.00)
LDL Cholesterol: 74 mg/dL (ref 0–99)
NonHDL: 100.53
Total CHOL/HDL Ratio: 2
Triglycerides: 135 mg/dL (ref 0.0–149.0)
VLDL: 27 mg/dL (ref 0.0–40.0)

## 2018-01-12 IMAGING — US US TRANSVAGINAL NON-OB
1 series · 15 of 25 positions shown · non-contrast
Comparison: Pelvic ultrasound 05/05/2017; CT 05/05/2017.

CLINICAL DATA: Patient with sharp abdominal pain.

EXAM:
TRANSABDOMINAL ULTRASOUND OF PELVIS
TECHNIQUE: Transabdominal ultrasound examination of the pelvis was performed
including evaluation of the uterus, ovaries, adnexal regions, and
pelvic cul-de-sac.

[Series 1: us transvaginal non-ob · 15 of 39 slices shown]
[im 1/39]
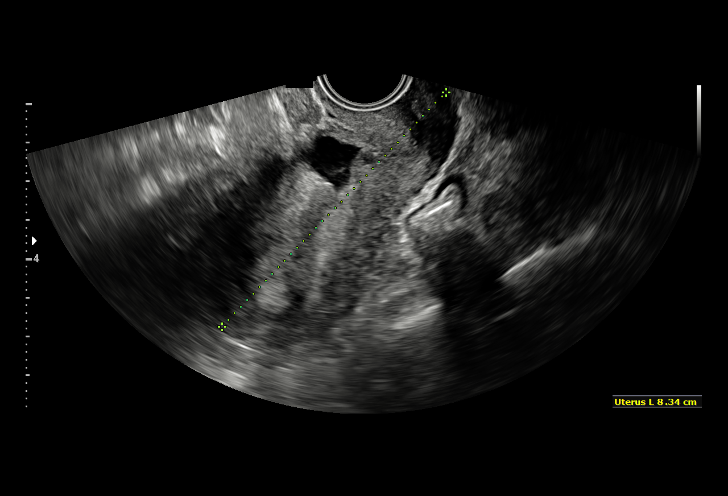
[im 4/39]
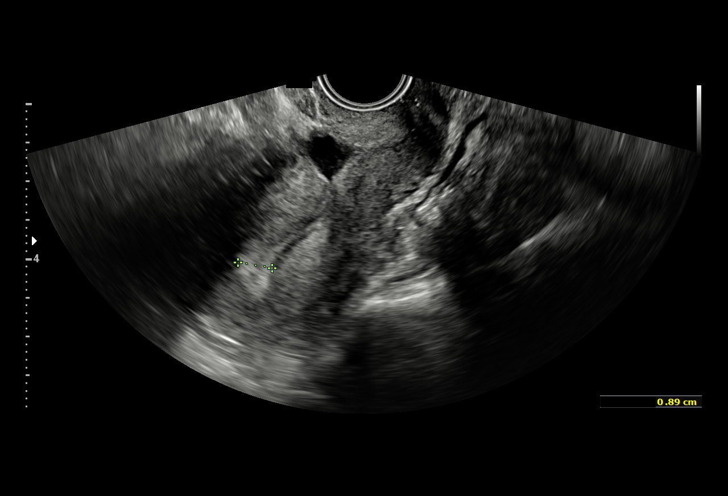
[im 7/39]
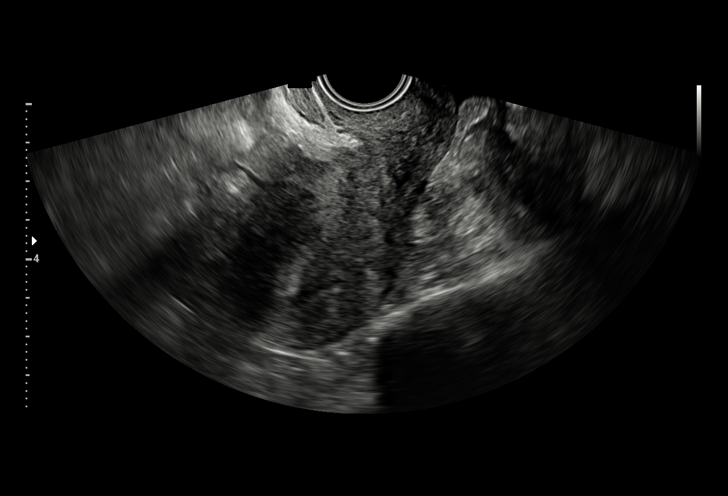
[im 8/39]
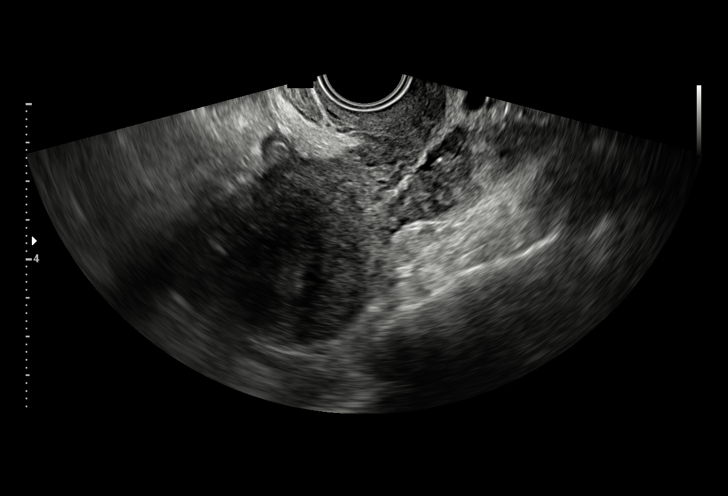
[im 12/39]
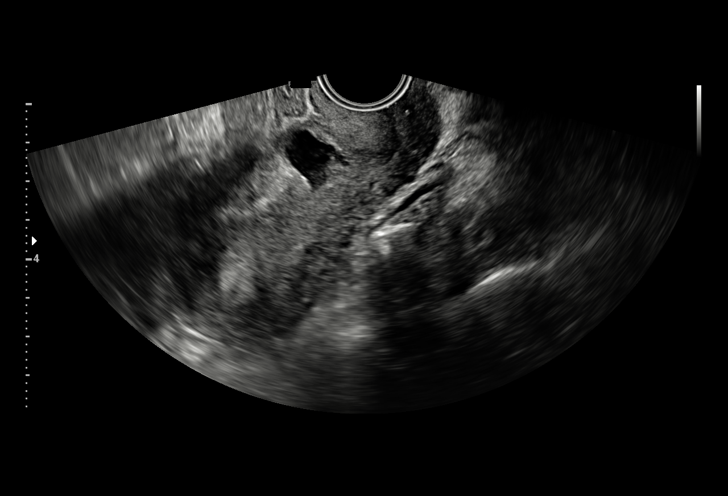
[im 15/39]
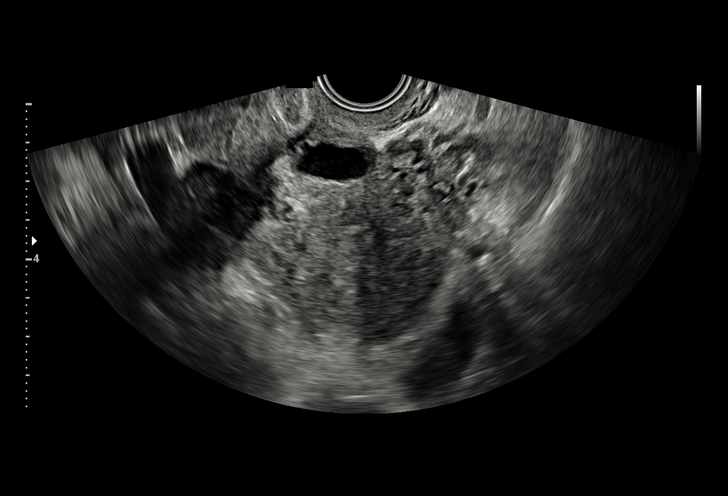
[im 16/39]
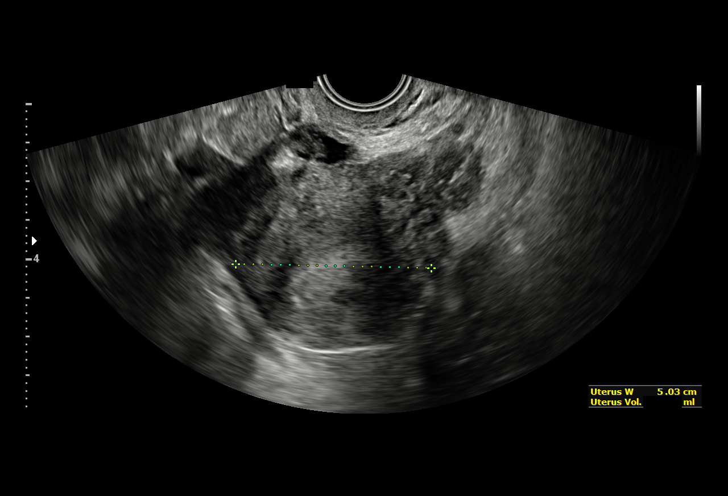
[im 20/39]
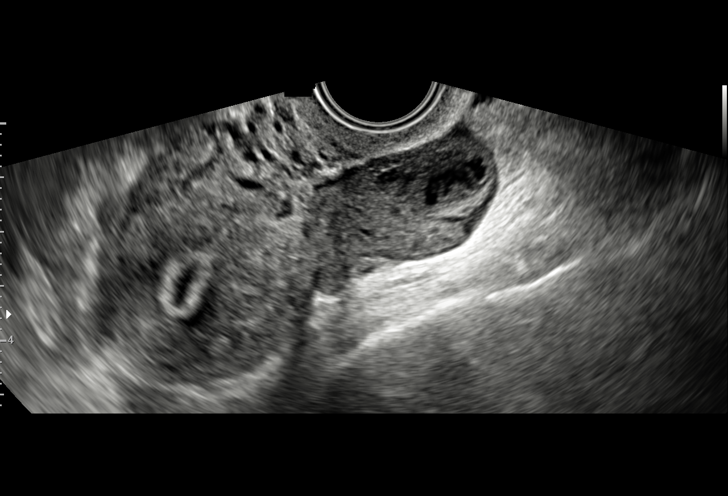
[im 23/39]
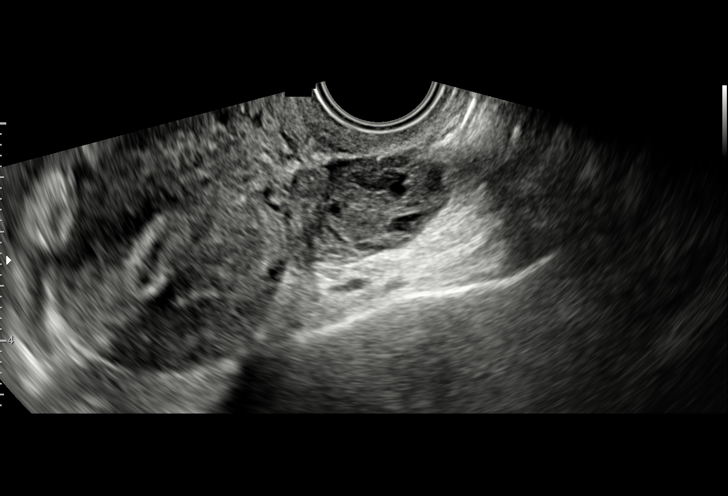
[im 24/39]
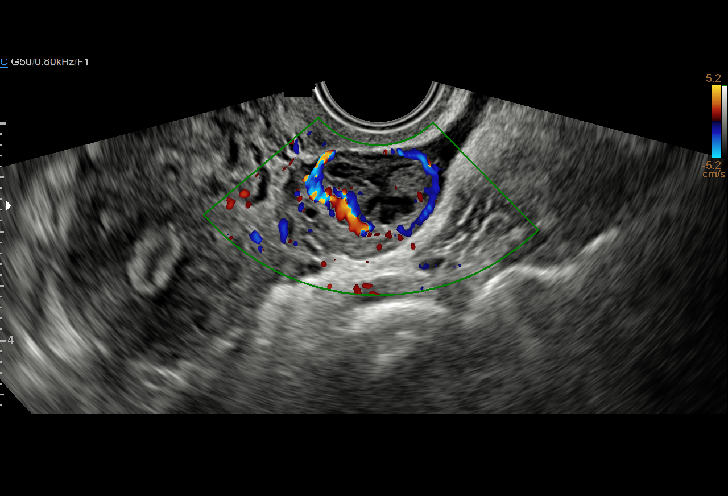
[im 27/39]
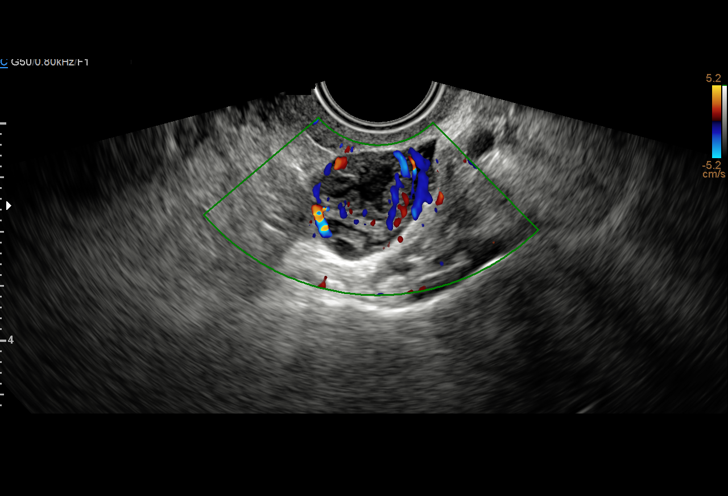
[im 31/39]
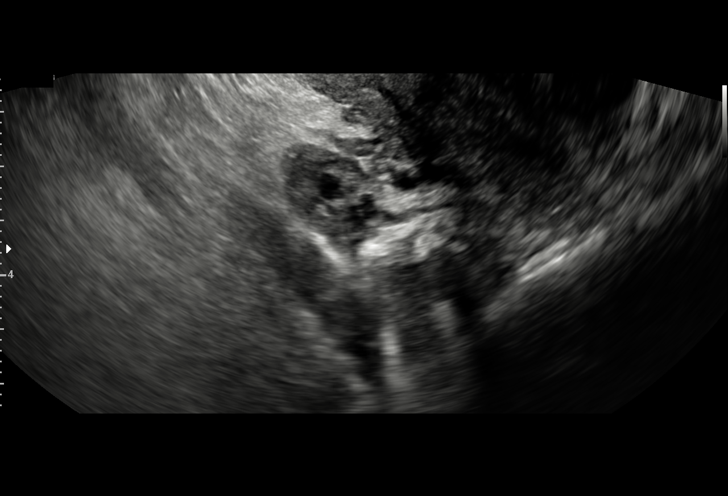
[im 32/39]
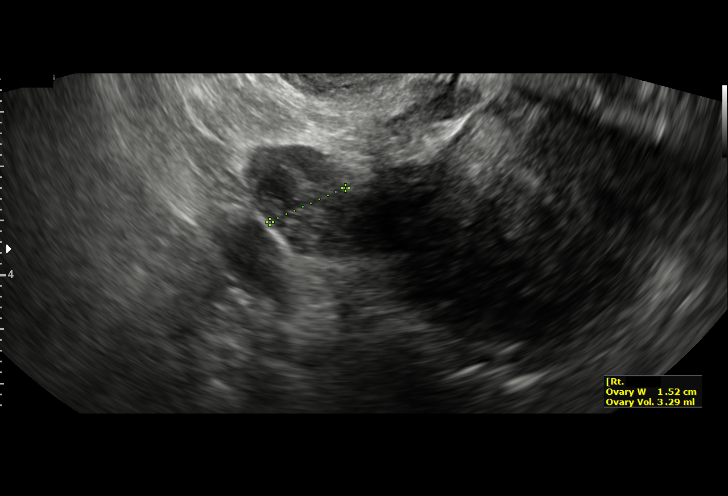
[im 35/39]
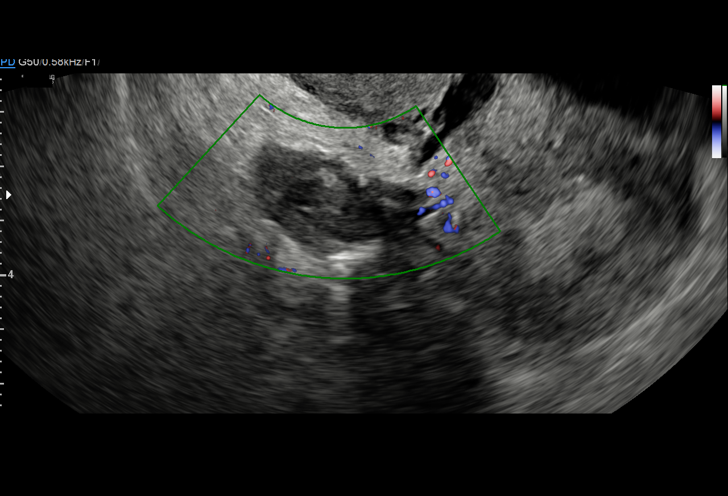
[im 39/39]
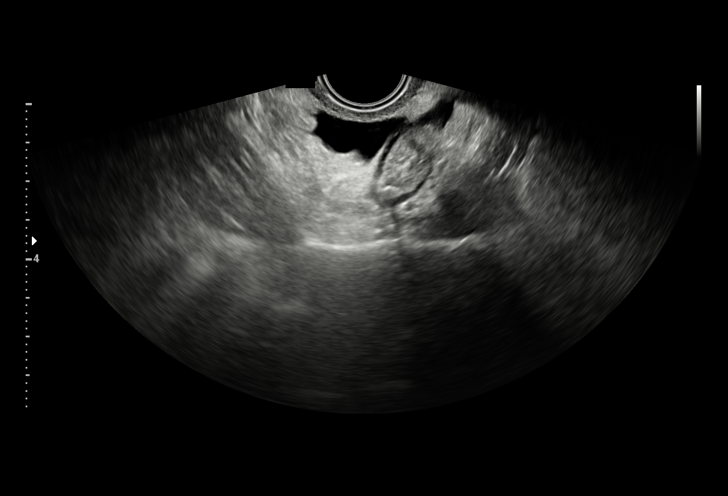

[15 of 25 positions shown; findings below may reference images not displayed]

FINDINGS: Uterus

Measurements: 8.3 x 4.6 x 5.0 cm. No fibroids or other mass
visualized. Within the anterior lower uterine segment there is a
x 1.1 x 1.8 cm cystic area.

Endometrium

Thickness: 9 mm.  No focal abnormality visualized.

Right ovary

Measurements: 2.5 x 1.6 x 1.5 cm. Normal appearance/no adnexal mass.

Left ovary

Measurements: 3.1 x 2.1 x 2.6 cm. There is a 1.7 x 1.2 x 1.4 cm
corpus luteum within the left ovary.

Other findings:  Small amount of free fluid in the pelvis.
IMPRESSION: Nonspecific cystic lesion within the anterior lower uterine segment
may represent sequelae of prior cesarean section or potentially
peripherally located nabothian cyst.

Corpus luteum left ovary.

No significant free fluid in the pelvis.

## 2018-01-18 ENCOUNTER — Telehealth: Payer: Self-pay | Admitting: Family Medicine

## 2018-01-18 NOTE — Telephone Encounter (Signed)
Copied from [place] [id number]. Topic: Quick Communication - Rx Refill/Question >> Jan 18, 2018 12:00 PM Aurelio Brash B wrote: Medication: varenicline (CHANTIX STARTING MONTH PAK) 0.5 MG X 11 & 1 MG X 42 tablet --2nd month supply  Has the patient contacted their pharmacy? No  (Agent: If no, request that the patient contact the pharmacy for the refill.) (Agent: If yes, when and what did the pharmacy advise?)  Preferred Pharmacy (with phone number or street name): [id number] - [place], [place] - [phone] (Phone) [phone] (Fax)      Agent: Please be advised that RX refills may take up to 3 business days. We ask that you follow-up with your pharmacy.

## 2018-01-19 ENCOUNTER — Other Ambulatory Visit: Payer: Self-pay

## 2018-01-19 MED ORDER — VARENICLINE TARTRATE 1 MG PO TABS: 1.0000 mg | ORAL_TABLET | Freq: Two times a day (BID) | ORAL | 1 refills | Status: DC

## 2018-01-19 NOTE — Telephone Encounter (Signed)
Called in refill Patient informed. Will call if any questions.

## 2018-01-19 NOTE — Telephone Encounter (Signed)
Pt calling back to request the 2nd month of the chantix Rx. Only got the starter pak.  Pt has only one day left and hopes to get today. 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

## 2018-02-11 ENCOUNTER — Other Ambulatory Visit: Payer: Self-pay | Admitting: Family Medicine

## 2018-02-11 NOTE — Telephone Encounter (Signed)
Ok to refill 

## 2018-02-11 NOTE — Telephone Encounter (Signed)
Check in with her to see if she actually requested this.

## 2018-02-14 NOTE — Telephone Encounter (Signed)
Called patient she does not want refilled.

## 2018-02-14 NOTE — Telephone Encounter (Signed)
Left message to return call to our office.  

## 2018-09-30 ENCOUNTER — Encounter: Payer: Self-pay | Admitting: Family Medicine

## 2018-09-30 ENCOUNTER — Ambulatory Visit: Payer: BC Managed Care – PPO | Admitting: Family Medicine

## 2018-09-30 VITALS — BP 121/75 | HR 71 | Temp 98.0°F | Resp 16 | Ht 62.0 in | Wt 133.5 lb

## 2018-09-30 DIAGNOSIS — H6690 Otitis media, unspecified, unspecified ear: Secondary | ICD-10-CM | POA: Diagnosis not present

## 2018-09-30 DIAGNOSIS — H6502 Acute serous otitis media, left ear: Secondary | ICD-10-CM

## 2018-09-30 MED ORDER — AMOXICILLIN 875 MG PO TABS: 875.0000 mg | ORAL_TABLET | Freq: Two times a day (BID) | ORAL | 0 refills | Status: AC

## 2018-09-30 NOTE — Progress Notes (Signed)
OFFICE VISIT  09/30/2018   CC:  Chief Complaint  Patient presents with  . Ear Pain    mainly right ear but left ear is starting to hurt     HPI:    Patient is a 48 y.o. Caucasian female who presents for ear pain. R ear feeling full and like it has fluid in it for 5d.  Left ear starting to feel full last 1-2 d.   R side of head starting to hurt some.  +Sneezing and runny nose during this time frame. No known fever.  No ST.  No cough. Tylenol no help.  Takes no nasal sprays.  Has not had any abx in the last couple of months.  Denies hx of recurrent OM.  Past Medical History:  Diagnosis Date  . Bipolar disorder ([place])   . Gestational diabetes   . HSV (herpes simplex virus) anogenital infection   . Vaginal Pap smear, abnormal     Past Surgical History:  Procedure Laterality Date  . ANKLE RECONSTRUCTION Bilateral   . CESAREAN SECTION    . CESAREAN SECTION N/A 06/28/2014   Procedure: CESAREAN SECTION;  Surgeon: Olga Millers, MD;  Location: [facility];  Service: Obstetrics;  Laterality: N/A;  . LEEP      Outpatient Medications Prior to Visit  Medication Sig Dispense Refill  . budesonide-formoterol (SYMBICORT) 160-4.5 MCG/ACT inhaler Inhale 2 puffs into the lungs 2 (two) times daily. 1 Inhaler 3  . ibuprofen (ADVIL,MOTRIN) 200 MG tablet Take 800 mg by mouth every 6 (six) hours as needed for moderate pain.     . LamoTRIgine (LAMICTAL XR) 300 MG TB24 Take 300 mg by mouth daily.     Marland Kitchen lurasidone (LATUDA) 40 MG TABS tablet Take 40 mg by mouth at bedtime.     Marland Kitchen OLANZapine (ZYPREXA) 10 MG tablet Take 15-20 mg by mouth at bedtime. Depending on sleep pattern    . Prenatal Vit-Fe Fumarate-FA (PRENATAL MULTIVITAMIN) TABS tablet Take 1 tablet by mouth daily at 12 noon.    . varenicline (CHANTIX CONTINUING MONTH PAK) 1 MG tablet Take 1 tablet (1 mg total) by mouth 2 (two) times daily. (Patient not taking: Reported on 09/30/2018) 60 tablet 1   No facility-administered medications prior to  visit.     No Known Allergies  ROS As per HPI  PE: Blood pressure 121/75, pulse 71, temperature 98 F (36.7 C), temperature source Oral, resp. rate 16, height 5\' 2"  (1.575 m), weight 133 lb 8 oz (60.6 kg), last menstrual period 09/27/2018, SpO2 100 %, currently breastfeeding. VS: noted--normal. Gen: alert, NAD, NONTOXIC APPEARING. HEENT: eyes without injection, drainage, or swelling.  Ears: EACs clear,  R TM erythematous and retracted. Clear fluid in middle ear but no purulent fluid.  Question of tiny/punctate central hole in TM but nothing draining from this.  L TM with a bit of a dull tint but otherwise normal.  Nose:No rhinorrhea.  Mildly injected mucosa.  No purulent d/c.  No paranasal sinus TTP.  No facial swelling.  Throat and mouth without focal lesion.  No pharyngial swelling, erythema, or exudate.   Neck: supple, no LAD.   LUNGS: CTA bilat, nonlabored resps.   CV: RRR, no m/r/g. EXT: no c/c/e SKIN: no rash  LABS:     Chemistry      Component Value Date/Time   NA 143 12/14/2017 1447   K 3.9 12/14/2017 1447   CL 104 12/14/2017 1447   CO2 29 12/14/2017 1447   BUN 12  12/14/2017 1447   CREATININE 1.12 12/14/2017 1447      Component Value Date/Time   CALCIUM 9.7 12/14/2017 1447   ALKPHOS 34 (L) 12/14/2017 1447   AST 19 12/14/2017 1447   ALT 14 12/14/2017 1447   BILITOT 0.3 12/14/2017 1447      IMPRESSION AND PLAN:  1) Right AOM: amoxil 875mg  bid x 7d. L likely with mild/early SOM. Tylenol 1000 mg q6h prn pain.  An After Visit Summary was printed and given to the patient.  FOLLOW UP: Return if symptoms worsen or fail to improve.  Signed:  Crissie Sickles, MD           09/30/2018

## 2018-10-03 ENCOUNTER — Ambulatory Visit: Payer: BC Managed Care – PPO | Admitting: Family Medicine

## 2018-10-03 ENCOUNTER — Encounter: Payer: Self-pay | Admitting: Family Medicine

## 2018-10-03 VITALS — BP 120/70 | HR 73 | Temp 97.9°F | Ht 62.0 in

## 2018-10-03 DIAGNOSIS — H6991 Unspecified Eustachian tube disorder, right ear: Secondary | ICD-10-CM | POA: Insufficient documentation

## 2018-10-03 DIAGNOSIS — Z20828 Contact with and (suspected) exposure to other viral communicable diseases: Secondary | ICD-10-CM | POA: Insufficient documentation

## 2018-10-03 DIAGNOSIS — H6981 Other specified disorders of Eustachian tube, right ear: Secondary | ICD-10-CM | POA: Insufficient documentation

## 2018-10-03 DIAGNOSIS — H6983 Other specified disorders of Eustachian tube, bilateral: Secondary | ICD-10-CM

## 2018-10-03 DIAGNOSIS — J019 Acute sinusitis, unspecified: Secondary | ICD-10-CM | POA: Insufficient documentation

## 2018-10-03 LAB — POC INFLUENZA A&B (BINAX/QUICKVUE)
INFLUENZA A, POC: NEGATIVE
INFLUENZA B, POC: NEGATIVE

## 2018-10-03 MED ORDER — OSELTAMIVIR PHOSPHATE 75 MG PO CAPS: 75.0000 mg | ORAL_CAPSULE | Freq: Two times a day (BID) | ORAL | 0 refills | Status: AC

## 2018-10-03 MED ORDER — PREDNISONE 10 MG PO TABS: 10.0000 mg | ORAL_TABLET | Freq: Two times a day (BID) | ORAL | 0 refills | Status: AC

## 2018-10-03 MED ORDER — METHYLPREDNISOLONE SODIUM SUCC 125 MG IJ SOLR
125.0000 mg | Freq: Once | INTRAMUSCULAR | Status: AC
  Administered 2018-10-03: 125 mg via INTRAMUSCULAR

## 2018-10-03 NOTE — Progress Notes (Signed)
Established Patient Office Visit  Subjective:  Patient ID: [name], female    DOB: 1971/03/31  Age: 48 y.o. MRN: [id number]  CC:  Chief Complaint  Patient presents with  . head congestion    HPI [name] presents for bilateral ear congestion status post URI over the last 7 to 10 days.  Seen previously and given amoxicillin for her sinus symptoms.  She denies history of otitis media as a child.  She was given a myringotomy and tube placement as an adult by an ENT doctor she tells me.  Her 58-year-old was recently diagnosed with the flu.  Patient denies fevers chills myalgias arthralgias.  She has taken prednisone in the past.  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  Past Medical History:  Diagnosis Date  . Bipolar disorder ([place])   . Gestational diabetes   . HSV (herpes simplex virus) anogenital infection   . Vaginal Pap smear, abnormal     Past Surgical History:  Procedure Laterality Date  . ANKLE RECONSTRUCTION Bilateral   . CESAREAN SECTION    . CESAREAN SECTION N/A 06/28/2014   Procedure: CESAREAN SECTION;  Surgeon: Olga Millers, MD;  Location: [facility];  Service: Obstetrics;  Laterality: N/A;  . LEEP      Family History  Problem Relation Age of Onset  . Diabetes Father        2  . Pancreatic cancer Maternal Grandmother   . Hypertension Maternal Grandfather   . Heart disease Maternal Grandfather     Social History   Socioeconomic History  . Marital status: Married    Spouse name: Not on file  . Number of children: Not on file  . Years of education: Not on file  . Highest education level: Not on file  Occupational History    Employer: [employer]   Social Needs  . Financial resource strain: Not on file  . Food insecurity:    Worry: Not on file    Inability: Not on file  . Transportation needs:    Medical: Not on file    Non-medical: Not on file  Tobacco Use  . Smoking status: Current Every Day Smoker    Packs/day: 0.50  .  Smokeless tobacco: Never Used  Substance and Sexual Activity  . Alcohol use: No  . Drug use: No  . Sexual activity: Yes  Lifestyle  . Physical activity:    Days per week: Not on file    Minutes per session: Not on file  . Stress: Not on file  Relationships  . Social connections:    Talks on phone: Not on file    Gets together: Not on file    Attends religious service: Not on file    Active member of club or organization: Not on file    Attends meetings of clubs or organizations: Not on file    Relationship status: Not on file  . Intimate partner violence:    Fear of current or ex partner: Not on file    Emotionally abused: Not on file    Physically abused: Not on file    Forced sexual activity: Not on file  Other Topics Concern  . Not on file  Social History Narrative  . Not on file    Outpatient Medications Prior to Visit  Medication Sig Dispense Refill  . amoxicillin (AMOXIL) 875 MG tablet Take 1 tablet (875 mg total) by mouth 2 (two) times daily for 7 days. [place]  tablet 0  . budesonide-formoterol (SYMBICORT) 160-4.5 MCG/ACT inhaler Inhale 2 puffs into the lungs 2 (two) times daily. 1 Inhaler 3  . ibuprofen (ADVIL,MOTRIN) 200 MG tablet Take 800 mg by mouth every 6 (six) hours as needed for moderate pain.     . LamoTRIgine (LAMICTAL XR) 300 MG TB24 Take 300 mg by mouth daily.     Marland Kitchen lurasidone (LATUDA) 40 MG TABS tablet Take 40 mg by mouth at bedtime.     Marland Kitchen OLANZapine (ZYPREXA) 10 MG tablet Take 15-20 mg by mouth at bedtime. Depending on sleep pattern     No facility-administered medications prior to visit.     No Known Allergies  ROS Review of Systems  Constitutional: Negative for diaphoresis, fatigue, fever and unexpected weight change.  HENT: Positive for congestion, ear pain, hearing loss, postnasal drip, rhinorrhea, sinus pressure and sinus pain.   Eyes: Negative for photophobia and visual disturbance.  Respiratory: Negative for cough.   Cardiovascular: Negative.     Gastrointestinal: Negative.   Endocrine: Negative for polyphagia and polyuria.  Genitourinary: Negative.   Musculoskeletal: Negative for arthralgias and myalgias.  Skin: Negative for pallor and rash.  Allergic/Immunologic: Negative for immunocompromised state.  Neurological: Positive for light-headedness and headaches.  Hematological: Negative.   Psychiatric/Behavioral: Negative for agitation and behavioral problems.      Objective:    Physical Exam  Constitutional: She is oriented to person, place, and time. She appears well-developed and well-nourished. No distress.  HENT:  Head: Normocephalic and atraumatic.  Right Ear: External ear normal. Tympanic membrane is scarred and retracted. Tympanic membrane is not injected and not erythematous. A middle ear effusion is present. No hemotympanum.  Left Ear: External ear normal. Tympanic membrane is retracted. Tympanic membrane is not injected and not erythematous. A middle ear effusion is present. No hemotympanum.  Ears:  Mouth/Throat: Oropharynx is clear and moist. No oropharyngeal exudate.  Eyes: Pupils are equal, round, and reactive to light. Conjunctivae are normal. Right eye exhibits no discharge. Left eye exhibits no discharge. No scleral icterus.  Neck: Neck supple. No JVD present. No tracheal deviation present. No thyromegaly present.  Cardiovascular: Normal rate, regular rhythm and normal heart sounds.  Pulmonary/Chest: Effort normal and breath sounds normal. No stridor. No respiratory distress. She has no wheezes. She has no rales.  Abdominal: Bowel sounds are normal.  Lymphadenopathy:    She has no cervical adenopathy.  Neurological: She is alert and oriented to person, place, and time.  Skin: Skin is warm and dry. She is not diaphoretic.  Psychiatric: She has a normal mood and affect. Her behavior is normal.    BP 120/70   Pulse 73   Temp 97.9 F (36.6 C) (Oral)   Ht 5\' 2"  (1.575 m)   LMP 09/27/2018   SpO2 98%   BMI  24.42 kg/m  Wt Readings from Last 3 Encounters:  09/30/18 133 lb 8 oz (60.6 kg)  12/14/17 133 lb 12.8 oz (60.7 kg)  09/23/17 135 lb 6.4 oz (61.4 kg)   BP Readings from Last 3 Encounters:  10/03/18 120/70  09/30/18 121/75  12/14/17 120/68   Guideline developer:  UpToDate (see UpToDate for funding source) Date Released: June 2014  There are no preventive care reminders to display for this patient.  There are no preventive care reminders to display for this patient.  Lab Results  Component Value Date   TSH 1.16 05/12/2012   Lab Results  Component Value Date   WBC 8.0 12/14/2017  HGB 13.8 12/14/2017   HCT 41.3 12/14/2017   MCV 95.7 12/14/2017   PLT 341.0 12/14/2017   Lab Results  Component Value Date   NA 143 12/14/2017   K 3.9 12/14/2017   CO2 29 12/14/2017   GLUCOSE 92 12/14/2017   BUN 12 12/14/2017   CREATININE 1.12 12/14/2017   BILITOT 0.3 12/14/2017   ALKPHOS 34 (L) 12/14/2017   AST 19 12/14/2017   ALT 14 12/14/2017   PROT 6.5 12/14/2017   ALBUMIN 4.1 12/14/2017   CALCIUM 9.7 12/14/2017   ANIONGAP 13 05/05/2017   GFR 55.95 (L) 12/14/2017   Lab Results  Component Value Date   CHOL 193 12/14/2017   Lab Results  Component Value Date   HDL 92.80 12/14/2017   Lab Results  Component Value Date   LDLCALC 74 12/14/2017   Lab Results  Component Value Date   TRIG 135.0 12/14/2017   Lab Results  Component Value Date   CHOLHDL 2 12/14/2017   Lab Results  Component Value Date   HGBA1C 5.4 10/20/2010      Assessment & Plan:   Problem List Items Addressed This Visit      Respiratory   Acute sinusitis   Relevant Medications   methylPREDNISolone sodium succinate (SOLU-MEDROL) 125 mg/2 mL injection 125 mg (Completed)   predniSONE (DELTASONE) 10 MG tablet   oseltamivir (TAMIFLU) 75 MG capsule     Nervous and Auditory   ETD (Eustachian tube dysfunction), bilateral   Relevant Medications   methylPREDNISolone sodium succinate (SOLU-MEDROL) 125 mg/2  mL injection 125 mg (Completed)   predniSONE (DELTASONE) 10 MG tablet     Other   Exposure to the flu - Primary   Relevant Medications   oseltamivir (TAMIFLU) 75 MG capsule   Other Relevant Orders   POC Influenza A&B(BINAX/QUICKVUE) (Completed)      Meds ordered this encounter  Medications  . methylPREDNISolone sodium succinate (SOLU-MEDROL) 125 mg/2 mL injection 125 mg  . predniSONE (DELTASONE) 10 MG tablet    Sig: Take 1 tablet (10 mg total) by mouth 2 (two) times daily with a meal for 5 days. Start tomorrow morning.    Dispense:  10 tablet    Refill:  0  . oseltamivir (TAMIFLU) 75 MG capsule    Sig: Take 1 capsule (75 mg total) by mouth 2 (two) times daily for 5 days.    Dispense:  10 capsule    Refill:  0    Follow-up: Return in about 5 days (around 10/08/2018), or may use decongestant. .   Patient will continue amoxicillin take Tamiflu for prophylaxis.  Solu-Medrol this afternoon and she will start her prednisone tomorrow morning.  She was given a handout on eustachian tube dysfunction.  She feels as though she can see her ENT expediently if this does not help.  Suggested that she could also use a decongestant such as pseudoephedrine.

## 2018-10-03 NOTE — Patient Instructions (Signed)

## 2018-10-10 ENCOUNTER — Encounter: Payer: Self-pay | Admitting: Family Medicine

## 2018-10-10 ENCOUNTER — Ambulatory Visit: Payer: BC Managed Care – PPO | Admitting: Family Medicine

## 2018-10-10 VITALS — BP 110/70 | HR 88 | Temp 97.6°F | Ht 62.0 in

## 2018-10-10 DIAGNOSIS — H6981 Other specified disorders of Eustachian tube, right ear: Secondary | ICD-10-CM | POA: Diagnosis not present

## 2018-10-10 MED ORDER — PREDNISONE 10 MG (21) PO TBPK: ORAL_TABLET | ORAL | 1 refills | Status: DC

## 2018-10-10 MED ORDER — METHYLPREDNISOLONE SODIUM SUCC 125 MG IJ SOLR
125.0000 mg | Freq: Once | INTRAMUSCULAR | Status: AC
  Administered 2018-10-10: 125 mg via INTRAMUSCULAR

## 2018-10-10 NOTE — Progress Notes (Signed)
Established Patient Office Visit  Subjective:  Patient ID: [name], female    DOB: 1970/11/02  Age: 48 y.o. MRN: [id number]  CC:  Chief Complaint  Patient presents with  . Ear Pain  . sinus pressure  . Pressure Behind the Eyes    HPI [name] presents for follow-up of her stuffy ears.  Left ear seems to have cleared but the right ear is worse.  Pressure and decreased hearing persists right ear through the side of her head.  She has been lightheaded and almost dizzy.  There is little spinning.  There is no fever or chills.  Watery rhinorrhea.  She has started Flonase more recently.  Past Medical History:  Diagnosis Date  . Bipolar disorder ([place])   . Gestational diabetes   . HSV (herpes simplex virus) anogenital infection   . Vaginal Pap smear, abnormal     Past Surgical History:  Procedure Laterality Date  . ANKLE RECONSTRUCTION Bilateral   . CESAREAN SECTION    . CESAREAN SECTION N/A 06/28/2014   Procedure: CESAREAN SECTION;  Surgeon: Olga Millers, MD;  Location: [facility];  Service: Obstetrics;  Laterality: N/A;  . LEEP      Family History  Problem Relation Age of Onset  . Diabetes Father        2  . Pancreatic cancer Maternal Grandmother   . Hypertension Maternal Grandfather   . Heart disease Maternal Grandfather     Social History   Socioeconomic History  . Marital status: Married    Spouse name: Not on file  . Number of children: Not on file  . Years of education: Not on file  . Highest education level: Not on file  Occupational History    Employer: [employer]   Social Needs  . Financial resource strain: Not on file  . Food insecurity:    Worry: Not on file    Inability: Not on file  . Transportation needs:    Medical: Not on file    Non-medical: Not on file  Tobacco Use  . Smoking status: Current Every Day Smoker    Packs/day: 0.50  . Smokeless tobacco: Never Used  Substance and Sexual Activity  . Alcohol use: No  .  Drug use: No  . Sexual activity: Yes  Lifestyle  . Physical activity:    Days per week: Not on file    Minutes per session: Not on file  . Stress: Not on file  Relationships  . Social connections:    Talks on phone: Not on file    Gets together: Not on file    Attends religious service: Not on file    Active member of club or organization: Not on file    Attends meetings of clubs or organizations: Not on file    Relationship status: Not on file  . Intimate partner violence:    Fear of current or ex partner: Not on file    Emotionally abused: Not on file    Physically abused: Not on file    Forced sexual activity: Not on file  Other Topics Concern  . Not on file  Social History Narrative  . Not on file    Outpatient Medications Prior to Visit  Medication Sig Dispense Refill  . budesonide-formoterol (SYMBICORT) 160-4.5 MCG/ACT inhaler Inhale 2 puffs into the lungs 2 (two) times daily. 1 Inhaler 3  . ibuprofen (ADVIL,MOTRIN) 200 MG tablet Take 800 mg by mouth every 6 (six) hours  as needed for moderate pain.     . LamoTRIgine (LAMICTAL XR) 300 MG TB24 Take 300 mg by mouth daily.     Marland Kitchen lurasidone (LATUDA) 40 MG TABS tablet Take 40 mg by mouth at bedtime.     Marland Kitchen OLANZapine (ZYPREXA) 10 MG tablet Take 15-20 mg by mouth at bedtime. Depending on sleep pattern     No facility-administered medications prior to visit.     No Known Allergies  ROS Review of Systems  Constitutional: Negative for chills, diaphoresis, fatigue, fever and unexpected weight change.  HENT: Positive for congestion, ear pain, hearing loss and rhinorrhea. Negative for ear discharge, postnasal drip, sinus pressure and sinus pain.   Eyes: Negative for photophobia and visual disturbance.  Respiratory: Negative.   Cardiovascular: Negative.   Gastrointestinal: Negative for abdominal distention and abdominal pain.  Genitourinary: Negative.   Musculoskeletal: Negative for gait problem and joint swelling.  Skin:  Negative for pallor and rash.  Allergic/Immunologic: Negative for immunocompromised state.  Neurological: Positive for light-headedness. Negative for seizures, facial asymmetry and headaches.  Hematological: Does not bruise/bleed easily.      Objective:    Physical Exam  Constitutional: She is oriented to person, place, and time. She appears well-developed and well-nourished. No distress.  HENT:  Head: Normocephalic and atraumatic.  Right Ear: External ear normal. Tympanic membrane is retracted. Tympanic membrane is not injected and not erythematous. A middle ear effusion (clear) is present.  Left Ear: External ear normal. Tympanic membrane is not injected, not erythematous and not retracted.  Mouth/Throat: Oropharynx is clear and moist. No oropharyngeal exudate.  Eyes: Pupils are equal, round, and reactive to light. Conjunctivae are normal. Right eye exhibits no discharge. Left eye exhibits no discharge. No scleral icterus.  Neck: Neck supple. No JVD present. No tracheal deviation present. No thyromegaly present.  Cardiovascular: Normal rate, regular rhythm and normal heart sounds.  Pulmonary/Chest: Effort normal. No stridor. No respiratory distress. She has decreased breath sounds. She has no wheezes. She has no rhonchi. She has no rales.  Lymphadenopathy:    She has no cervical adenopathy.  Neurological: She is alert and oriented to person, place, and time.  Skin: Skin is warm and dry. She is not diaphoretic.  Psychiatric: She has a normal mood and affect. Her behavior is normal.    BP 110/70   Pulse 88   Temp 97.6 F (36.4 C) (Oral)   Ht 5\' 2"  (1.575 m)   LMP 09/27/2018   SpO2 99%   BMI 24.42 kg/m  Wt Readings from Last 3 Encounters:  09/30/18 133 lb 8 oz (60.6 kg)  12/14/17 133 lb 12.8 oz (60.7 kg)  09/23/17 135 lb 6.4 oz (61.4 kg)   BP Readings from Last 3 Encounters:  10/10/18 110/70  10/03/18 120/70  09/30/18 121/75   Guideline developer:  UpToDate (see UpToDate  for funding source) Date Released: June 2014  There are no preventive care reminders to display for this patient.  There are no preventive care reminders to display for this patient.  Lab Results  Component Value Date   TSH 1.16 05/12/2012   Lab Results  Component Value Date   WBC 8.0 12/14/2017   HGB 13.8 12/14/2017   HCT 41.3 12/14/2017   MCV 95.7 12/14/2017   PLT 341.0 12/14/2017   Lab Results  Component Value Date   NA 143 12/14/2017   K 3.9 12/14/2017   CO2 29 12/14/2017   GLUCOSE 92 12/14/2017   BUN 12 12/14/2017  CREATININE 1.12 12/14/2017   BILITOT 0.3 12/14/2017   ALKPHOS 34 (L) 12/14/2017   AST 19 12/14/2017   ALT 14 12/14/2017   PROT 6.5 12/14/2017   ALBUMIN 4.1 12/14/2017   CALCIUM 9.7 12/14/2017   ANIONGAP 13 05/05/2017   GFR 55.95 (L) 12/14/2017   Lab Results  Component Value Date   CHOL 193 12/14/2017   Lab Results  Component Value Date   HDL 92.80 12/14/2017   Lab Results  Component Value Date   LDLCALC 74 12/14/2017   Lab Results  Component Value Date   TRIG 135.0 12/14/2017   Lab Results  Component Value Date   CHOLHDL 2 12/14/2017   Lab Results  Component Value Date   HGBA1C 5.4 10/20/2010      Assessment & Plan:   Problem List Items Addressed This Visit      Nervous and Auditory   ETD (Eustachian tube dysfunction), right - Primary   Relevant Medications   methylPREDNISolone sodium succinate (SOLU-MEDROL) 125 mg/2 mL injection 125 mg (Start on 10/10/2018  2:00 PM)   predniSONE (STERAPRED UNI-PAK 21 TAB) 10 MG (21) TBPK tablet      Meds ordered this encounter  Medications  . methylPREDNISolone sodium succinate (SOLU-MEDROL) 125 mg/2 mL injection 125 mg  . predniSONE (STERAPRED UNI-PAK 21 TAB) 10 MG (21) TBPK tablet    Sig: Take 6 today, 5 tomorrow, 4 the next day and then 3, 2, 1 and stop    Dispense:  21 tablet    Refill:  1    Follow-up: No follow-ups on file.   Stressed the importance of continuing with the  Flonase as it may take a week or 2 for the medication to become effective.  She will start her prednisone [id number] tomorrow morning.  She may go on from here to need tube placement.

## 2018-10-27 ENCOUNTER — Encounter: Payer: Self-pay | Admitting: Family Medicine

## 2018-10-27 ENCOUNTER — Ambulatory Visit: Payer: BC Managed Care – PPO | Admitting: Family Medicine

## 2018-10-27 VITALS — BP 114/68 | HR 87 | Temp 98.4°F | Ht 62.0 in | Wt 135.0 lb

## 2018-10-27 DIAGNOSIS — H9201 Otalgia, right ear: Secondary | ICD-10-CM

## 2018-10-27 MED ORDER — METHYLPREDNISOLONE ACETATE 80 MG/ML IJ SUSP
80.0000 mg | Freq: Once | INTRAMUSCULAR | Status: AC
  Administered 2018-10-27: 80 mg via INTRAMUSCULAR

## 2018-10-27 MED ORDER — AZELASTINE HCL 0.1 % NA SOLN: 2.0000 | Freq: Two times a day (BID) | NASAL | 0 refills | Status: DC

## 2018-10-27 NOTE — Patient Instructions (Signed)
It was very nice to see you today!  Please start the astelin. We will give you an injection today.  Please follow up with ENT soon.   Take care, Dr Jerline Pain

## 2018-10-27 NOTE — Progress Notes (Signed)
   Chief Complaint:  [name] is a 48 y.o. female who presents today with a chief complaint of ear [name].   Assessment/Plan:  Right ear [name]/eustachian tube dysfunction Patient has objective evidence of hearing loss based on otoacoustic emission testing today.  She has tried several rounds of steroids with no improvement.  We will add on Astelin nasal spray in addition to the Flonase that she is already doing.  We will also give 80 mg of IM Depo-Medrol today.  Strongly encouraged patient to follow-up with ENT soon given that her symptoms are worsening.  Patient voiced understanding.  She will be calling ENT to schedule appointment soon.    Subjective:  HPI:  Right Ear [name] Patient was diagnosed with otitis media 4 weeks ago and was started on Augmentin.  She was also started on oral prednisone at that time.  About a week after that she was seen by ENT recommended that she finish her course of antibiotics and follow-up if symptoms persist after 6 weeks.  She was again seen twice after that and started on another course of prednisone and given IM steroids.  Symptoms have continued to worsen.  She has been on Flonase with no improvement.  She has also had some increasing difficulty with hearing in her right ear.  Symptoms make it difficult for her to work.  ROS: Per HPI  PMH: She reports that she has been smoking. She has been smoking about 0.50 packs per day. She has never used smokeless tobacco. She reports that she does not drink alcohol or use drugs.      Objective:  Physical Exam: BP 114/68 (BP Location: Left Arm, Patient Position: Sitting, Cuff Size: Normal)   Pulse 87   Temp 98.4 F (36.9 C) (Oral)   Ht 5\' 2"  (1.575 m)   Wt 135 lb (61.2 kg)   LMP 09/27/2018   SpO2 97%   BMI 24.69 kg/m   Gen: NAD, resting comfortably HEENT: Right TM bulging slightly erythematous with clear effusion.  Left TM clear.  OAE Screening: Refer on right.  Passing left.      [name]. Sabrina Pain,  MD 10/27/2018 3:17 PM

## 2018-11-07 ENCOUNTER — Other Ambulatory Visit: Payer: Self-pay

## 2018-11-18 ENCOUNTER — Other Ambulatory Visit: Payer: Self-pay | Admitting: Family Medicine

## 2018-12-16 ENCOUNTER — Other Ambulatory Visit: Payer: Self-pay | Admitting: Family Medicine

## 2018-12-16 NOTE — Telephone Encounter (Signed)
Rx request 

## 2018-12-16 NOTE — Telephone Encounter (Signed)
Pt will call for app later next week she does not need refill right now.

## 2019-06-08 ENCOUNTER — Encounter: Payer: Self-pay | Admitting: Family Medicine

## 2019-06-08 ENCOUNTER — Ambulatory Visit: Payer: BC Managed Care – PPO | Admitting: Family Medicine

## 2019-06-08 VITALS — BP 118/60 | HR 90 | Ht 62.0 in | Wt 141.2 lb

## 2019-06-08 DIAGNOSIS — F17210 Nicotine dependence, cigarettes, uncomplicated: Secondary | ICD-10-CM

## 2019-06-08 DIAGNOSIS — Z23 Encounter for immunization: Secondary | ICD-10-CM | POA: Diagnosis not present

## 2019-06-08 DIAGNOSIS — Z0000 Encounter for general adult medical examination without abnormal findings: Secondary | ICD-10-CM | POA: Diagnosis not present

## 2019-06-08 DIAGNOSIS — Z716 Tobacco abuse counseling: Secondary | ICD-10-CM | POA: Insufficient documentation

## 2019-06-08 DIAGNOSIS — Z13220 Encounter for screening for lipoid disorders: Secondary | ICD-10-CM | POA: Insufficient documentation

## 2019-06-08 LAB — CBC
HCT: 34.7 % — ABNORMAL LOW (ref 35.0–45.0)
Hemoglobin: 11.9 g/dL (ref 11.7–15.5)
MCH: 30.8 pg (ref 27.0–33.0)
MCHC: 34.3 g/dL (ref 32.0–36.0)
MCV: 89.9 fL (ref 80.0–100.0)
MPV: 10.4 fL (ref 7.5–12.5)
Platelets: 308 10*3/uL (ref 140–400)
RBC: 3.86 10*6/uL (ref 3.80–5.10)
RDW: 12.6 % (ref 11.0–15.0)
WBC: 7.5 10*3/uL (ref 3.8–10.8)

## 2019-06-08 MED ORDER — NICOTINE 10 MG IN INHA: 1.0000 | RESPIRATORY_TRACT | 1 refills | Status: DC | PRN

## 2019-06-08 NOTE — Progress Notes (Signed)
Established Patient Office Visit  Subjective:  Patient ID: [name], female    DOB: October 13, 1970  Age: 48 y.o. MRN: [id number]  CC:  Chief Complaint  Patient presents with  . Establish Care    HPI [name] presents for a physical exam.  She is fasting today.  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  Tells me that her husband is coming to see me in a few weeks.  She has 3 boys ages 71 up to 41.  They are attending life classes at [facility]  Their older child is in confirmation class with her [employer] and that is worked well.  Best is a wall patient has been tobacco free since July.  She is using the Nicotrol inhaler system with great success.  She would like to continue it.  Female care is through her GYN.  She has follow-up scheduled with her dentist in a few weeks.  She has not seen her eye doctor as of yet.  Past Medical History:  Diagnosis Date  . Bipolar disorder ([place])   . Gestational diabetes   . HSV (herpes simplex virus) anogenital infection   . Vaginal Pap smear, abnormal     Past Surgical History:  Procedure Laterality Date  . ANKLE RECONSTRUCTION Bilateral   . CESAREAN SECTION    . CESAREAN SECTION N/A 06/28/2014   Procedure: CESAREAN SECTION;  Surgeon: Olga Millers, MD;  Location: [facility];  Service: Obstetrics;  Laterality: N/A;  . LEEP      Family History  Problem Relation Age of Onset  . Diabetes Father        2  . Pancreatic cancer Maternal Grandmother   . Hypertension Maternal Grandfather   . Heart disease Maternal Grandfather     Social History   Socioeconomic History  . Marital status: Married    Spouse name: Not on file  . Number of children: Not on file  . Years of education: Not on file  . Highest education level: Not on file  Occupational History    Employer: [employer]   Social Needs  . Financial resource strain: Not on file  . Food insecurity    Worry:  Not on file    Inability: Not on file  . Transportation needs    Medical: Not on file    Non-medical: Not on file  Tobacco Use  . Smoking status: Former Smoker    Packs/day: 0.50    Quit date: 03/06/2019    Years since quitting: 0.2  . Smokeless tobacco: Never Used  Substance and Sexual Activity  . Alcohol use: No  . Drug use: No  . Sexual activity: Yes  Lifestyle  . Physical activity    Days per week: Not on file    Minutes per session: Not on file  . Stress: Not on file  Relationships  . Social Herbalist on phone: Not on file    Gets together: Not on file    Attends religious service: Not on file    Active member of club or organization: Not on file    Attends meetings of clubs or organizations: Not on file    Relationship status: Not on file  . Intimate partner violence    Fear of current or ex partner: Not on file    Emotionally abused: Not on file    Physically abused: Not on file  Forced sexual activity: Not on file  Other Topics Concern  . Not on file  Social History Narrative  . Not on file    Outpatient Medications Prior to Visit  Medication Sig Dispense Refill  . budesonide-formoterol (SYMBICORT) 160-4.5 MCG/ACT inhaler Inhale 2 puffs into the lungs 2 (two) times daily. 1 Inhaler 3  . fluticasone (FLONASE) 50 MCG/ACT nasal spray SPRAY 2 SPRAYS INTO EACH NOSTRIL EVERY DAY    . ibuprofen (ADVIL,MOTRIN) 200 MG tablet Take 800 mg by mouth every 6 (six) hours as needed for moderate pain.     . LamoTRIgine (LAMICTAL XR) 300 MG TB24 Take 300 mg by mouth daily.     Marland Kitchen lurasidone (LATUDA) 40 MG TABS tablet Take 40 mg by mouth at bedtime.     Marland Kitchen OLANZapine (ZYPREXA) 10 MG tablet Take 15-20 mg by mouth at bedtime. Depending on sleep pattern    . azelastine (ASTELIN) 0.1 % nasal spray PLACE 2 SPRAYS INTO BOTH NOSTRILS 2 (TWO) TIMES DAILY. USE IN EACH NOSTRIL AS DIRECTED 30 mL 0  . predniSONE (STERAPRED UNI-PAK 21 TAB) 10 MG (21) TBPK tablet Take 6 today, 5  tomorrow, 4 the next day and then 3, 2, 1 and stop 21 tablet 1   No facility-administered medications prior to visit.     No Known Allergies  ROS Review of Systems  Constitutional: Negative.   HENT: Negative.   Eyes: Negative for photophobia and visual disturbance.  Respiratory: Negative.   Cardiovascular: Negative.   Gastrointestinal: Negative.   Endocrine: Negative for polyphagia and polyuria.  Genitourinary: Negative.   Musculoskeletal: Negative for arthralgias and myalgias.  Skin: Negative for pallor and rash.  Allergic/Immunologic: Negative for immunocompromised state.  Neurological: Negative for light-headedness and numbness.  Hematological: Does not bruise/bleed easily.  Psychiatric/Behavioral: Negative.       Objective:    Physical Exam  Constitutional: She is oriented to person, place, and time. She appears well-developed and well-nourished. No distress.  HENT:  Head: Normocephalic and atraumatic.  Right Ear: External ear normal.  Left Ear: External ear normal.  Mouth/Throat: Oropharynx is clear and moist. No oropharyngeal exudate.  Eyes: Pupils are equal, round, and reactive to light. Conjunctivae are normal. Right eye exhibits no discharge. Left eye exhibits no discharge. No scleral icterus.  Neck: No JVD present. No tracheal deviation present. No thyromegaly present.  Cardiovascular: Normal rate, regular rhythm and normal heart sounds.  Pulmonary/Chest: Breath sounds normal. No stridor.  Abdominal: Bowel sounds are normal.  Musculoskeletal:        General: No edema.  Lymphadenopathy:    She has no cervical adenopathy.  Neurological: She is alert and oriented to person, place, and time.  Skin: Skin is warm and dry. She is not diaphoretic.  Psychiatric: She has a normal mood and affect.    BP 118/60   Pulse 90   Ht 5\' 2"  (1.575 m)   Wt 141 lb 4 oz (64.1 kg)   SpO2 98%   BMI 25.83 kg/m  Wt Readings from Last 3 Encounters:  06/08/19 141 lb 4 oz (64.1  kg)  10/27/18 135 lb (61.2 kg)  09/30/18 133 lb 8 oz (60.6 kg)   BP Readings from Last 3 Encounters:  06/08/19 118/60  10/27/18 114/68  10/10/18 110/70   Guideline developer:  UpToDate (see UpToDate for funding source) Date Released: June 2014  There are no preventive care reminders to display for this patient.  There are no preventive care reminders to display for this  patient.  Lab Results  Component Value Date   TSH 1.16 05/12/2012   Lab Results  Component Value Date   WBC 8.0 12/14/2017   HGB 13.8 12/14/2017   HCT 41.3 12/14/2017   MCV 95.7 12/14/2017   PLT 341.0 12/14/2017   Lab Results  Component Value Date   NA 143 12/14/2017   K 3.9 12/14/2017   CO2 29 12/14/2017   GLUCOSE 92 12/14/2017   BUN 12 12/14/2017   CREATININE 1.12 12/14/2017   BILITOT 0.3 12/14/2017   ALKPHOS 34 (L) 12/14/2017   AST 19 12/14/2017   ALT 14 12/14/2017   PROT 6.5 12/14/2017   ALBUMIN 4.1 12/14/2017   CALCIUM 9.7 12/14/2017   ANIONGAP 13 05/05/2017   GFR 55.95 (L) 12/14/2017   Lab Results  Component Value Date   CHOL 193 12/14/2017   Lab Results  Component Value Date   HDL 92.80 12/14/2017   Lab Results  Component Value Date   LDLCALC 74 12/14/2017   Lab Results  Component Value Date   TRIG 135.0 12/14/2017   Lab Results  Component Value Date   CHOLHDL 2 12/14/2017   Lab Results  Component Value Date   HGBA1C 5.4 10/20/2010      Assessment & Plan:   Problem List Items Addressed This Visit      Other   Nicotine dependence   Relevant Medications   nicotine (NICOTROL) 10 MG inhaler   Health care maintenance   Relevant Orders   CBC   Comprehensive metabolic panel   Lipid panel   TSH   Urinalysis, Routine w reflex microscopic   Need for influenza vaccination - Primary   Relevant Orders   Flu Vaccine QUAD 36+ mos IM (Completed)      Meds ordered this encounter  Medications  . nicotine (NICOTROL) 10 MG inhaler    Sig: Inhale 1 Cartridge (1  continuous puffing total) into the lungs as needed for smoking cessation.    Dispense:  42 each    Refill:  1    Follow-up: Return in about 1 year (around 06/07/2020), or if symptoms worsen or fail to improve.   Encouraged the patient to continue on her smoking cessation journey in use the Nicotrol as needed to help.  Given information on health maintenance and disease prevention.

## 2019-06-08 NOTE — Patient Instructions (Signed)
Health Maintenance, Female Adopting a healthy lifestyle and getting preventive care are important in promoting health and wellness. Ask your health care provider about:  The right schedule for you to have regular tests and exams.  Things you can do on your own to prevent diseases and keep yourself healthy. What should I know about diet, weight, and exercise? Eat a healthy diet   Eat a diet that includes plenty of vegetables, fruits, low-fat dairy products, and lean protein.  Do not eat a lot of foods that are high in solid fats, added sugars, or sodium. Maintain a healthy weight Body mass index (BMI) is used to identify weight problems. It estimates body fat based on height and weight. Your health care provider can help determine your BMI and help you achieve or maintain a healthy weight. Get regular exercise Get regular exercise. This is one of the most important things you can do for your health. Most adults should:  Exercise for at least 150 minutes each week. The exercise should increase your heart rate and make you sweat (moderate-intensity exercise).  Do strengthening exercises at least twice a week. This is in addition to the moderate-intensity exercise.  Spend less time sitting. Even light physical activity can be beneficial. Watch cholesterol and blood lipids Have your blood tested for lipids and cholesterol at 46 years of age, then have this test every 5 years. Have your cholesterol levels checked more often if:  Your lipid or cholesterol levels are high.  You are older than 46 years of age.  You are at high risk for heart disease. What should I know about cancer screening? Depending on your health history and family history, you may need to have cancer screening at various ages. This may include screening for:  Breast cancer.  Cervical cancer.  Colorectal cancer.  Skin cancer.  Lung cancer. What should I know about heart disease, diabetes, and high blood  pressure? Blood pressure and heart disease  High blood pressure causes heart disease and increases the risk of stroke. This is more likely to develop in people who have high blood pressure readings, are of African descent, or are overweight.  Have your blood pressure checked: ? Every 3-5 years if you are 54-62 years of age. ? Every year if you are 93 years old or older. Diabetes Have regular diabetes screenings. This checks your fasting blood sugar level. Have the screening done:  Once every three years after age 69 if you are at a normal weight and have a low risk for diabetes.  More often and at a younger age if you are overweight or have a high risk for diabetes. What should I know about preventing infection? Hepatitis B If you have a higher risk for hepatitis B, you should be screened for this virus. Talk with your health care provider to find out if you are at risk for hepatitis B infection. Hepatitis C Testing is recommended for:  Everyone born from [birth year] through 1963.  Anyone with known risk factors for hepatitis C. Sexually transmitted infections (STIs)  Get screened for STIs, including gonorrhea and chlamydia, if: ? You are sexually active and are younger than 47 years of age. ? You are older than 46 years of age and your health care provider tells you that you are at risk for this type of infection. ? Your sexual activity has changed since you were last screened, and you are at increased risk for chlamydia or gonorrhea. Ask your health care provider if  you are at risk.  Ask your health care provider about whether you are at high risk for HIV. Your health care provider may recommend a prescription medicine to help prevent HIV infection. If you choose to take medicine to prevent HIV, you should first get tested for HIV. You should then be tested every 3 months for as long as you are taking the medicine. Pregnancy  If you are about to stop having your period (premenopausal) and  you may become pregnant, seek counseling before you get pregnant.  Take 400 to 800 micrograms (mcg) of folic acid every day if you become pregnant.  Ask for birth control (contraception) if you want to prevent pregnancy. Osteoporosis and menopause Osteoporosis is a disease in which the bones lose minerals and strength with aging. This can result in bone fractures. If you are 34 years old or older, or if you are at risk for osteoporosis and fractures, ask your health care provider if you should:  Be screened for bone loss.  Take a calcium or vitamin D supplement to lower your risk of fractures.  Be given hormone replacement therapy (HRT) to treat symptoms of menopause. Follow these instructions at home: Lifestyle  Do not use any products that contain nicotine or tobacco, such as cigarettes, e-cigarettes, and chewing tobacco. If you need help quitting, ask your health care provider.  Do not use street drugs.  Do not share needles.  Ask your health care provider for help if you need support or information about quitting drugs. Alcohol use  Do not drink alcohol if: ? Your health care provider tells you not to drink. ? You are pregnant, may be pregnant, or are planning to become pregnant.  If you drink alcohol: ? Limit how much you use to 0-1 drink a day. ? Limit intake if you are breastfeeding.  Be aware of how much alcohol is in your drink. In the [place] one drink equals one 12 oz bottle of beer (355 mL), one 5 oz glass of wine (148 mL), or one 1 oz glass of hard liquor (44 mL). General instructions  Schedule regular health, dental, and eye exams.  Stay current with your vaccines.  Tell your health care provider if: ? You often feel depressed. ? You have ever been abused or do not feel safe at home. Summary  Adopting a healthy lifestyle and getting preventive care are important in promoting health and wellness.  Follow your health care provider's instructions about healthy  diet, exercising, and getting tested or screened for diseases.  Follow your health care provider's instructions on monitoring your cholesterol and blood pressure. This information is not intended to replace advice given to you by your health care provider. Make sure you discuss any questions you have with your health care provider. Document Released: 03/02/2011 Document Revised: 08/10/2018 Document Reviewed: 08/10/2018 Elsevier Patient Education  2020 Elsevier Inc.  Preventive Care 60-58 Years Old, Female Preventive care refers to visits with your health care provider and lifestyle choices that can promote health and wellness. This includes:  A yearly physical exam. This may also be called an annual well check.  Regular dental visits and eye exams.  Immunizations.  Screening for certain conditions.  Healthy lifestyle choices, such as eating a healthy diet, getting regular exercise, not using drugs or products that contain nicotine and tobacco, and limiting alcohol use. What can I expect for my preventive care visit? Physical exam Your health care provider will check your:  Height and weight. This  may be used to calculate body mass index (BMI), which tells if you are at a healthy weight.  Heart rate and blood pressure.  Skin for abnormal spots. Counseling Your health care provider may ask you questions about your:  Alcohol, tobacco, and drug use.  Emotional well-being.  Home and relationship well-being.  Sexual activity.  Eating habits.  Work and work Statistician.  Method of birth control.  Menstrual cycle.  Pregnancy history. What immunizations do I need?  Influenza (flu) vaccine  This is recommended every year. Tetanus, diphtheria, and pertussis (Tdap) vaccine  You may need a Td booster every 10 years. Varicella (chickenpox) vaccine  You may need this if you have not been vaccinated. Zoster (shingles) vaccine  You may need this after age 34. Measles,  mumps, and rubella (MMR) vaccine  You may need at least one dose of MMR if you were born in 1957 or later. You may also need a second dose. Pneumococcal conjugate (PCV13) vaccine  You may need this if you have certain conditions and were not previously vaccinated. Pneumococcal polysaccharide (PPSV23) vaccine  You may need one or two doses if you smoke cigarettes or if you have certain conditions. Meningococcal conjugate (MenACWY) vaccine  You may need this if you have certain conditions. Hepatitis A vaccine  You may need this if you have certain conditions or if you travel or work in places where you may be exposed to hepatitis A. Hepatitis B vaccine  You may need this if you have certain conditions or if you travel or work in places where you may be exposed to hepatitis B. Haemophilus influenzae type b (Hib) vaccine  You may need this if you have certain conditions. Human papillomavirus (HPV) vaccine  If recommended by your health care provider, you may need three doses over 6 months. You may receive vaccines as individual doses or as more than one vaccine together in one shot (combination vaccines). Talk with your health care provider about the risks and benefits of combination vaccines. What tests do I need? Blood tests  Lipid and cholesterol levels. These may be checked every 5 years, or more frequently if you are over 58 years old.  Hepatitis C test.  Hepatitis B test. Screening  Lung cancer screening. You may have this screening every year starting at age 63 if you have a 30-pack-year history of smoking and currently smoke or have quit within the past 15 years.  Colorectal cancer screening. All adults should have this screening starting at age 45 and continuing until age 12. Your health care provider may recommend screening at age 22 if you are at increased risk. You will have tests every 1-10 years, depending on your results and the type of screening test.  Diabetes  screening. This is done by checking your blood sugar (glucose) after you have not eaten for a while (fasting). You may have this done every 1-3 years.  Mammogram. This may be done every 1-2 years. Talk with your health care provider about when you should start having regular mammograms. This may depend on whether you have a family history of breast cancer.  BRCA-related cancer screening. This may be done if you have a family history of breast, ovarian, tubal, or peritoneal cancers.  Pelvic exam and Pap test. This may be done every 3 years starting at age 59. Starting at age 27, this may be done every 5 years if you have a Pap test in combination with an HPV test. Other tests  Sexually transmitted disease (STD) testing.  Bone density scan. This is done to screen for osteoporosis. You may have this scan if you are at high risk for osteoporosis. Follow these instructions at home: Eating and drinking  Eat a diet that includes fresh fruits and vegetables, whole grains, lean protein, and low-fat dairy.  Take vitamin and mineral supplements as recommended by your health care provider.  Do not drink alcohol if: ? Your health care provider tells you not to drink. ? You are pregnant, may be pregnant, or are planning to become pregnant.  If you drink alcohol: ? Limit how much you have to 0-1 drink a day. ? Be aware of how much alcohol is in your drink. In the [place] one drink equals one 12 oz bottle of beer (355 mL), one 5 oz glass of wine (148 mL), or one 1 oz glass of hard liquor (44 mL). Lifestyle  Take daily care of your teeth and gums.  Stay active. Exercise for at least 30 minutes on 5 or more days each week.  Do not use any products that contain nicotine or tobacco, such as cigarettes, e-cigarettes, and chewing tobacco. If you need help quitting, ask your health care provider.  If you are sexually active, practice safe sex. Use a condom or other form of birth control (contraception) in  order to prevent pregnancy and STIs (sexually transmitted infections).  If told by your health care provider, take low-dose aspirin daily starting at age 45. What's next?  Visit your health care provider once a year for a well check visit.  Ask your health care provider how often you should have your eyes and teeth checked.  Stay up to date on all vaccines. This information is not intended to replace advice given to you by your health care provider. Make sure you discuss any questions you have with your health care provider. Document Released: 09/13/2015 Document Revised: 04/28/2018 Document Reviewed: 04/28/2018 Elsevier Patient Education  2020 [employer].  Steps to Quit Smoking Smoking tobacco is the leading cause of preventable death. It can affect almost every organ in the body. Smoking puts you and those around you at risk for developing many serious chronic diseases. Quitting smoking can be difficult, but it is one of the best things that you can do for your health. It is never too late to quit. How do I get ready to quit? When you decide to quit smoking, create a plan to help you succeed. Before you quit:  Pick a date to quit. Set a date within the next 2 weeks to give you time to prepare.  Write down the reasons why you are quitting. Keep this list in places where you will see it often.  Tell your family, friends, and co-workers that you are quitting. Support from your loved ones can make quitting easier.  Talk with your health care provider about your options for quitting smoking.  Find out what treatment options are covered by your health insurance.  Identify people, places, things, and activities that make you want to smoke (triggers). Avoid them. What first steps can I take to quit smoking?  Throw away all cigarettes at home, at work, and in your car.  Throw away smoking accessories, such as Scientist, research (medical).  Clean your car. Make sure to empty the  ashtray.  Clean your home, including curtains and carpets. What strategies can I use to quit smoking? Talk with your health care provider about combining strategies, such as taking  medicines while you are also receiving in-person counseling. Using these two strategies together makes you more likely to succeed in quitting than if you used either strategy on its own.  If you are pregnant or breastfeeding, talk with your health care provider about finding counseling or other support strategies to quit smoking. Do not take medicine to help you quit smoking unless your health care provider tells you to do so. To quit smoking: Quit right away  Quit smoking completely, instead of gradually reducing how much you smoke over a period of time. Research shows that stopping smoking right away is more successful than gradually quitting.  Attend in-person counseling to help you build problem-solving skills. You are more likely to succeed in quitting if you attend counseling sessions regularly. Even short sessions of 10 minutes can be effective. Take medicine You may take medicines to help you quit smoking. Some medicines require a prescription and some you can purchase over-the-counter. Medicines may have nicotine in them to replace the nicotine in cigarettes. Medicines may:  Help to stop cravings.  Help to relieve withdrawal symptoms. Your health care provider may recommend:  Nicotine patches, gum, or lozenges.  Nicotine inhalers or sprays.  Non-nicotine medicine that is taken by mouth. Find resources Find resources and support systems that can help you to quit smoking and remain smoke-free after you quit. These resources are most helpful when you use them often. They include:  Online chats with a Social worker.  Telephone quitlines.  Printed Furniture conservator/restorer.  Support groups or group counseling.  Text messaging programs.  Mobile phone apps or applications. Use apps that can help you stick to  your quit plan by providing reminders, tips, and encouragement. There are many free apps for mobile devices as well as websites. Examples include Quit Guide from the [employer] and smokefree.gov What things can I do to make it easier to quit?   Reach out to your family and friends for support and encouragement. Call telephone quitlines (1-800-QUIT-NOW), reach out to support groups, or work with a counselor for support.  Ask people who smoke to avoid smoking around you.  Avoid places that trigger you to smoke, such as bars, parties, or smoke-break areas at work.  Spend time with people who do not smoke.  Lessen the stress in your life. Stress can be a smoking trigger for some people. To lessen stress, try: ? Exercising regularly. ? Doing deep-breathing exercises. ? Doing yoga. ? Meditating. ? Performing a body scan. This involves closing your eyes, scanning your body from head to toe, and noticing which parts of your body are particularly tense. Try to relax the muscles in those areas. How will I feel when I quit smoking? Day 1 to 3 weeks Within the first 24 hours of quitting smoking, you may start to feel withdrawal symptoms. These symptoms are usually most noticeable 2-3 days after quitting, but they usually do not last for more than 2-3 weeks. You may experience these symptoms:  Mood swings.  Restlessness, anxiety, or irritability.  Trouble concentrating.  Dizziness.  Strong cravings for sugary foods and nicotine.  Mild weight gain.  Constipation.  Nausea.  Coughing or a sore throat.  Changes in how the medicines that you take for unrelated issues work in your body.  Depression.  Trouble sleeping (insomnia). Week 3 and afterward After the first 2-3 weeks of quitting, you may start to notice more positive results, such as:  Improved sense of smell and taste.  Decreased coughing and sore  throat.  Slower heart rate.  Lower blood pressure.  Clearer skin.  The ability  to breathe more easily.  Fewer sick days. Quitting smoking can be very challenging. Do not get discouraged if you are not successful the first time. Some people need to make many attempts to quit before they achieve long-term success. Do your best to stick to your quit plan, and talk with your health care provider if you have any questions or concerns. Summary  Smoking tobacco is the leading cause of preventable death. Quitting smoking is one of the best things that you can do for your health.  When you decide to quit smoking, create a plan to help you succeed.  Quit smoking right away, not slowly over a period of time.  When you start quitting, seek help from your health care provider, family, or friends. This information is not intended to replace advice given to you by your health care provider. Make sure you discuss any questions you have with your health care provider. Document Released: 08/11/2001 Document Revised: 11/04/2018 Document Reviewed: 11/05/2018 Elsevier Patient Education  2020 [employer].

## 2019-06-09 LAB — URINALYSIS, ROUTINE W REFLEX MICROSCOPIC
Bacteria, UA: NONE SEEN /HPF
Bilirubin Urine: NEGATIVE
Glucose, UA: NEGATIVE
Hgb urine dipstick: NEGATIVE
Hyaline Cast: NONE SEEN /LPF
Nitrite: NEGATIVE
Protein, ur: NEGATIVE
RBC / HPF: NONE SEEN /HPF (ref 0–2)
Specific Gravity, Urine: 1.016 (ref 1.001–1.03)
Squamous Epithelial / LPF: NONE SEEN /HPF (ref <=5)
pH: 6.5 (ref 5.0–8.0)

## 2019-06-09 LAB — LIPID PANEL
Cholesterol: 231 mg/dL — ABNORMAL HIGH (ref 0–200)
HDL: 60.9 mg/dL (ref >39.00)
LDL Cholesterol: 155 mg/dL — ABNORMAL HIGH (ref 0–99)
NonHDL: 170.36
Total CHOL/HDL Ratio: 4
Triglycerides: 75 mg/dL (ref 0.0–149.0)
VLDL: 15 mg/dL (ref 0.0–40.0)

## 2019-06-09 LAB — COMPREHENSIVE METABOLIC PANEL
ALT: 12 U/L (ref 0–35)
AST: 14 U/L (ref 0–37)
Albumin: 4.5 g/dL (ref 3.5–5.2)
Alkaline Phosphatase: 27 U/L — ABNORMAL LOW (ref 39–117)
BUN: 14 mg/dL (ref 6–23)
CO2: 25 mEq/L (ref 19–32)
Calcium: 10 mg/dL (ref 8.4–10.5)
Chloride: 103 mEq/L (ref 96–112)
Creatinine, Ser: 1.12 mg/dL (ref 0.40–1.20)
GFR: 52.29 mL/min — ABNORMAL LOW (ref >60.00)
Glucose, Bld: 81 mg/dL (ref 70–99)
Potassium: 4.1 mEq/L (ref 3.5–5.1)
Sodium: 138 mEq/L (ref 135–145)
Total Bilirubin: 0.4 mg/dL (ref 0.2–1.2)
Total Protein: 7.2 g/dL (ref 6.0–8.3)

## 2019-06-09 LAB — TSH: TSH: 1.28 u[IU]/mL (ref 0.35–4.50)

## 2019-11-27 ENCOUNTER — Ambulatory Visit: Payer: BC Managed Care – PPO | Admitting: Family Medicine

## 2019-11-27 ENCOUNTER — Other Ambulatory Visit: Payer: Self-pay

## 2019-11-27 NOTE — Patient Instructions (Addendum)
An important concept for appetite management:  We are all hard-wired to get pleasure from food.  Food satisfaction is essential for appetite regulation.    Goal:  1. Eat at least 3 REAL meals and 1-2 snacks per day.  Eat breakfast within one hour of getting up.  Aim for no more than 5 hours between eating.     - A REAL breakfast includes at least some protein and some starch.     - Lunch and dinner include at least some protein and some starch and vegetables and/or fruit.       (OR: Would you serve this to a guest in your home, and call it a meal?)  Document your progress on the above goal.   Document also your intake of coffee (# of ounces of coffee and # tablespoons of cream).    Follow-up: Telehealth appt via [place] on Monday, April 19 at 2:30 PM.

## 2019-11-27 NOTE — Progress Notes (Signed)
Telehealth Encounter I connected with [name] (MRN [id number]) on 11/27/2019 by MyChart video-enabled, HIPAA-compliant telemedicine application, verified that I am speaking with the correct person using two identifiers, and that the patient was in a private environment conducive to confidentiality.  The patient agreed to proceed. PCP Abelino Derrick, MD Therapist Lennart Pall, PhD  Provider was Kennith Center, PhD, RD, LDN, CEDRD Provider was located at [facility] during this telehealth encounter; patient was at home  Appt start time: 1600 end time: 1700 (1 hour)  Reason for telehealth visit: Referred by therapist Lennart Pall for Medical Nutrition Therapy related to disordered eating.    Relevant history/background: [name] has a h/o anorexia nervosa, which began in middle school.  She saw a psychologist once at that time, but has received no other help for her D/O.  She has never had normal eating behavior; has counted kcal most of her life, and although she swam, biked, ran, and played tennis in college, she calculated kcal intake and expenditure to achieve negative kcal balance.  She would like to learn to eat in a way that truly fuels her body.  She has become more concerned about her own eating behaviors as she has seen her 53-YO son (who plays [employer] tennis) starting to read all food labels, and seem to be obsessing over nutrient content.  Also has 12- & 5- YO sons.    Assessment: She was an only child, and her parents divorced when [name] was 6.  Her mother followed a variety of extreme dietary routines, and suffered with bulimia nervosa.  [name] and sweets were disallowed in the home.  Currently, [name] has no regular eating patterns. She counts kcal daily, and drink coffee all day - in lieu of breakfast and lunch.   Usual eating pattern: 1 meal (dinner) and 0-3 snacks per day. Frequent foods and beverages: water, coffee with 5-6 tbsp cream/18 oz, diet juices (5  kcal/cup), diet soda; diet yogurt (80 kcal), beef jerkey, grilled meats, salad, spaghetti w/ meat sauce, veg's, bananas.  Avoided foods: lox, weird seafood, (disliked), oysters (gets sick); energy drinks (ED disallows b/c of kcal.   Usual physical activity: nothing consistent.  Plans to start beginner tennis.  Sleep: Estimates she gets 8 1/2 to 10 hours/night with sleep aid.  24-hr recall:  (Up at 9:30 AM) B (9:30 AM)-  18 oz coffee, 5 tbsp cream Snk (11 AM)-  18 oz coffee, 5 tbsp cream L ( PM)-  12 oz coffee, ~4 tbsp cream - 1 PM: Went to mass - Snk (4 PM)-  Water, diet Coke, beef jerkey (100 kcal), yogurt (80 kcal), 3/4 banana D (6:30 PM)-  1 chx thigh with skin, 1/4 c chx rice, 1 1/4 c granola w/  raisins, 1/2 c 2% milk Snk (8 PM)-  3/4 c granola w/ raisins, 1/2 c 2% milk Typical day? No. Usually has veg's at dinner, and was hungrier after dinner than usual.  Always has something sweet after dinner, although gave up sweets for lent.    Intervention: Completed diet and physical activity history, and established behavioral goals as well as means of documenting progress (notebook).    For recommendations and goals, see Patient Instructions.    Follow-up: Telehealth appt in 3 weeks.   [name],[name]

## 2019-11-30 ENCOUNTER — Telehealth: Payer: Self-pay | Admitting: Family Medicine

## 2019-11-30 ENCOUNTER — Other Ambulatory Visit: Payer: Self-pay

## 2019-11-30 DIAGNOSIS — F17210 Nicotine dependence, cigarettes, uncomplicated: Secondary | ICD-10-CM

## 2019-11-30 MED ORDER — NICOTINE 10 MG IN INHA: 1.0000 | RESPIRATORY_TRACT | 1 refills | Status: DC | PRN

## 2019-11-30 NOTE — Telephone Encounter (Signed)
Last OV 06/08/19 Last fill 06/08/19  #42/1

## 2019-11-30 NOTE — Telephone Encounter (Signed)
Patient is calling and requesting a refill for nicotine inhaler sent to [facility] on [address]. CB is [phone]

## 2019-12-18 ENCOUNTER — Other Ambulatory Visit: Payer: Self-pay

## 2019-12-18 ENCOUNTER — Telehealth: Payer: BC Managed Care – PPO | Admitting: Family Medicine

## 2019-12-25 ENCOUNTER — Other Ambulatory Visit: Payer: Self-pay

## 2019-12-25 ENCOUNTER — Encounter: Payer: Self-pay | Admitting: Psychiatry

## 2019-12-25 ENCOUNTER — Ambulatory Visit (INDEPENDENT_AMBULATORY_CARE_PROVIDER_SITE_OTHER): Payer: BC Managed Care – PPO | Admitting: Psychiatry

## 2019-12-25 VITALS — BP 112/74 | HR 73 | Ht 62.0 in | Wt 150.0 lb

## 2019-12-25 DIAGNOSIS — F3160 Bipolar disorder, current episode mixed, unspecified: Secondary | ICD-10-CM

## 2019-12-25 DIAGNOSIS — F419 Anxiety disorder, unspecified: Secondary | ICD-10-CM | POA: Diagnosis not present

## 2019-12-25 MED ORDER — LURASIDONE HCL 60 MG PO TABS: 60.0000 mg | ORAL_TABLET | Freq: Every day | ORAL | 1 refills | Status: DC

## 2019-12-25 MED ORDER — LAMOTRIGINE ER 300 MG PO TB24: 300.0000 mg | ORAL_TABLET | Freq: Every day | ORAL | 0 refills | Status: DC
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
## 2019-12-25 NOTE — Progress Notes (Signed)
   12/25/19 1219  Facial and Oral Movements  Muscles of Facial Expression 0  Lips and Perioral Area 0  Jaw 0  Tongue 0  Extremity Movements  Upper (arms, wrists, hands, fingers) 0  Lower (legs, knees, ankles, toes) 0  Trunk Movements  Neck, shoulders, hips 0  Overall Severity  Severity of abnormal movements (highest score from questions above) 0  Incapacitation due to abnormal movements 0  Patient's awareness of abnormal movements (rate only patient's report) 0  AIMS Total Score  AIMS Total Score 0

## 2020-01-21 ENCOUNTER — Other Ambulatory Visit: Payer: Self-pay | Admitting: Family Medicine

## 2020-01-21 DIAGNOSIS — F17210 Nicotine dependence, cigarettes, uncomplicated: Secondary | ICD-10-CM

## 2020-01-25 ENCOUNTER — Encounter: Payer: Self-pay | Admitting: Psychiatry

## 2020-01-25 ENCOUNTER — Other Ambulatory Visit: Payer: Self-pay

## 2020-01-25 ENCOUNTER — Ambulatory Visit: Payer: BC Managed Care – PPO | Admitting: Psychiatry

## 2020-01-25 DIAGNOSIS — F419 Anxiety disorder, unspecified: Secondary | ICD-10-CM

## 2020-01-25 DIAGNOSIS — F3160 Bipolar disorder, current episode mixed, unspecified: Secondary | ICD-10-CM

## 2020-01-25 MED ORDER — LAMOTRIGINE ER 300 MG PO TB24: 300.0000 mg | ORAL_TABLET | Freq: Every day | ORAL | 0 refills | Status: DC

## 2020-01-25 MED ORDER — LURASIDONE HCL 60 MG PO TABS: 60.0000 mg | ORAL_TABLET | Freq: Every day | ORAL | 0 refills | Status: DC

## 2020-01-25 NOTE — Progress Notes (Signed)
   01/25/20 1723  Facial and Oral Movements  Muscles of Facial Expression 0  Lips and Perioral Area 0  Jaw 0  Tongue 0  Extremity Movements  Upper (arms, wrists, hands, fingers) 0  Lower (legs, knees, ankles, toes) 0  Trunk Movements  Neck, shoulders, hips 0  Overall Severity  Severity of abnormal movements (highest score from questions above) 0  Incapacitation due to abnormal movements 0  Patient's awareness of abnormal movements (rate only patient's report) 0  AIMS Total Score  AIMS Total Score 0

## 2020-01-25 NOTE — Progress Notes (Signed)
[name] [id number] 12-Jul-1971 49 y.o.  Subjective:   Patient ID:  [name] is a 49 y.o. (DOB 27-Jun-1971) female.  Chief Complaint:  Chief Complaint  Patient presents with  . Follow-up    h/o Mood disturbance and anxiety    HPI [name] presents to the office today for follow-up of mood and anxiety. She reports that Lamictal XR seems to be more effective for her and that mood is less labile. She reports that she initially had some fatigue with increase in Latuda to 60 mg po QHS and this resolved after a week. She reports, "I still get irritated and resentful" and that this is improving. She reports less irritability. Denies elevated mood.  Has some situational "dread" with certain situations. Reports that mood has been less depressed and it is easier getting up in the morning. Energy and motivation have been good. Sleeping well. Goes to bed around 9-9:30 am and sleeps until 7-7:15 am. Appetite has been good. Concentration has been ok. Denies SI.   Moved in December and reports that she has boxes left to move. Reports that she is driving frequently with commute and taking son to school and activities.   Continues to have therapy with Dr. Eulas Post. Completing required community service. Has been playing tennis. 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   Past Psychiatric Medication Trials: Lamictal- Has been helpful for mood. Reports that she has had fatigue with higher doses.  Lithium- Wt gain Depakote Carbamazepine Topamax Seroquel Latuda- Has taken since 2010. She reports that this has been helpful for her mood. Has not been on doses higher than 40 mg.  Olanzapine- Has been helpful for insomnia. Denies side effects. Has taken since 2000 Trilafon Effexor XR Prozac [place]     Office Visit from 01/25/2020 in [facility] Visit from 12/25/2019 in [facility] Total Score  0  0    PHQ2-9     Office Visit  from 09/23/2017 in [facility]  PHQ-2 Total Score  0       Review of Systems:  Review of Systems  Musculoskeletal: Negative for gait problem.       Has been having knee pain and had upcoming MRI.   Neurological: Negative for tremors.  Psychiatric/Behavioral:       Please refer to HPI    Medications: I have reviewed the patient's current medications.  Current Outpatient Medications  Medication Sig Dispense Refill  . LamoTRIgine (LAMICTAL XR) 300 MG TB24 24 hour tablet Take 1 tablet (300 mg total) by mouth daily. 90 tablet 0  . Lurasidone HCl 60 MG TABS Take 1 tablet (60 mg total) by mouth daily with supper. 90 tablet 0  . OLANZapine (ZYPREXA) 10 MG tablet Take 15-20 mg by mouth at bedtime. Depending on sleep pattern    . NICOTROL 10 MG inhaler INHALE 1 CARTRIDGE (1 CONTINUOUS PUFFING TOTAL) INTO THE LUNGS AS NEEDED FOR SMOKING CESSATION. 168 each 1   No current facility-administered medications for this visit.    Medication Side Effects: None  Allergies: No Known Allergies  Past Medical History:  Diagnosis Date  . Bipolar disorder ([place])   . Gestational diabetes   . HSV (herpes simplex virus) anogenital infection   . Vaginal Pap smear, abnormal     Family History  Problem Relation Age of Onset  . Depression Mother   . Bipolar disorder Mother   . Personality disorder Mother   .  Diabetes Father        2  . Diabetes Mellitus II Father   . Alcohol abuse Father   . Pancreatic cancer Maternal Grandmother   . Hypertension Maternal Grandfather   . Heart disease Maternal Grandfather   . ADD / ADHD Son   . ODD Son     Social History   Socioeconomic History  . Marital status: Married    Spouse name: Not on file  . Number of children: Not on file  . Years of education: Not on file  . 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 Smoking status: Former Smoker    Packs/day: 0.50     Quit date: 03/06/2019    Years since quitting: 0.8  . Smokeless tobacco: Never Used  Substance and Sexual Activity  . Alcohol use: No    Comment: Last use 2019  . Drug use: No  . Sexual activity: Yes  Other Topics Concern  . Not on file  Social History Narrative  . Not on file   Social Determinants of Health   Financial Resource Strain:   . Difficulty of Paying Living Expenses:   Food Insecurity:   . Worried About Charity fundraiser in the Last Year:   . Arboriculturist in the Last Year:   Transportation Needs:   . Film/video editor (Medical):   Marland Kitchen Lack of Transportation (Non-Medical):   Physical Activity:   . Days of Exercise per Week:   . Minutes of Exercise per Session:   Stress:   . Feeling of Stress :   Social Connections:   . Frequency of Communication with Friends and Family:   . Frequency of Social Gatherings with Friends and Family:   . Attends Religious Services:   . Active Member of Clubs or Organizations:   . Attends Archivist Meetings:   Marland Kitchen Marital Status:   Intimate Partner Violence:   . Fear of Current or Ex-Partner:   . Emotionally Abused:   Marland Kitchen Physically Abused:   . Sexually Abused:     Past Medical History, Surgical history, Social history, and Family history were reviewed and updated as appropriate.   Please see review of systems for further details on the patient's review from today.   Objective:   Physical Exam:  There were no vitals taken for this visit.  Physical Exam Constitutional:      General: She is not in acute distress. Musculoskeletal:        General: No deformity.  Neurological:     Mental Status: She is alert and oriented to person, place, and time.     Coordination: Coordination normal.  Psychiatric:        Attention and Perception: Attention and perception normal. She does not perceive auditory or visual hallucinations.        Mood and Affect: Mood normal. Mood is not anxious or depressed. Affect is not  labile, blunt, angry or inappropriate.        Speech: Speech normal.        Behavior: Behavior normal.        Thought Content: Thought content normal. Thought content is not paranoid or delusional. Thought content does not include homicidal or suicidal ideation. Thought content does not include homicidal or suicidal plan.        Cognition and Memory: Cognition and memory normal.  Judgment: Judgment normal.     Comments: Insight intact     Lab Review:     Component Value Date/Time   NA 138 06/08/2019 1530   K 4.1 06/08/2019 1530   CL 103 06/08/2019 1530   CO2 25 06/08/2019 1530   GLUCOSE 81 06/08/2019 1530   BUN 14 06/08/2019 1530   CREATININE 1.12 06/08/2019 1530   CALCIUM 10.0 06/08/2019 1530   PROT 7.2 06/08/2019 1530   ALBUMIN 4.5 06/08/2019 1530   AST 14 06/08/2019 1530   ALT 12 06/08/2019 1530   ALKPHOS 27 (L) 06/08/2019 1530   BILITOT 0.4 06/08/2019 1530   GFRNONAA >60 05/05/2017 1125   GFRAA >60 05/05/2017 1125       Component Value Date/Time   WBC 7.5 06/08/2019 1530   RBC 3.86 06/08/2019 1530   HGB 11.9 06/08/2019 1530   HCT 34.7 (L) 06/08/2019 1530   PLT 308 06/08/2019 1530   MCV 89.9 06/08/2019 1530   MCH 30.8 06/08/2019 1530   MCHC 34.3 06/08/2019 1530   RDW 12.6 06/08/2019 1530   LYMPHSABS 3.1 12/14/2017 1447   MONOABS 0.7 12/14/2017 1447   EOSABS 0.1 12/14/2017 1447   BASOSABS 0.0 12/14/2017 1447    No results found for: POCLITH, LITHIUM   No results found for: PHENYTOIN, PHENOBARB, VALPROATE, CBMZ   .res Assessment: Plan:   Will continue current plan of care since target signs and symptoms are well controlled without any tolerability issues. Continue Lamictal XR 300 mg po qd for mood stabilization.  Continue Latuda 60 mg po q evening with a meal.  Continue Olanzapine 15 mg po QHS for insomnia and mood stabilization.  Recommend continuing therapy with Dr. Eulas Post. Pt to f/u in 2 months or sooner if clinically indicated.  Patient advised to  contact office with any questions, adverse effects, or acute worsening in signs and symptoms.  [name] was seen today for follow-up.  Diagnoses and all orders for this visit:  Bipolar affective disorder, current episode mixed, current episode severity unspecified (HCC) -     LamoTRIgine (LAMICTAL XR) 300 MG TB24 24 hour tablet; Take 1 tablet (300 mg total) by mouth daily. -     Lurasidone HCl 60 MG TABS; Take 1 tablet (60 mg total) by mouth daily with supper.  Anxiety disorder, unspecified type     Please see After Visit Summary for patient specific instructions.  Future Appointments  Date Time Provider [place]  04/04/2020 10:30 AM Thayer Headings, PMHNP [facility] None    No orders of the defined types were placed in this encounter.   -------------------------------

## 2020-04-01 ENCOUNTER — Encounter: Payer: Self-pay | Admitting: Family Medicine

## 2020-04-01 ENCOUNTER — Ambulatory Visit: Payer: BC Managed Care – PPO | Admitting: Family Medicine

## 2020-04-01 ENCOUNTER — Other Ambulatory Visit: Payer: Self-pay

## 2020-04-01 VITALS — BP 116/68 | HR 78 | Temp 98.6°F | Ht 62.0 in | Wt 141.6 lb

## 2020-04-01 DIAGNOSIS — Z1211 Encounter for screening for malignant neoplasm of colon: Secondary | ICD-10-CM

## 2020-04-01 DIAGNOSIS — M1711 Unilateral primary osteoarthritis, right knee: Secondary | ICD-10-CM | POA: Insufficient documentation

## 2020-04-01 DIAGNOSIS — F17210 Nicotine dependence, cigarettes, uncomplicated: Secondary | ICD-10-CM

## 2020-04-01 MED ORDER — NICOTINE 10 MG IN INHA: RESPIRATORY_TRACT | 1 refills | Status: DC

## 2020-04-01 MED ORDER — NICOTINE 10 MG IN INHA: RESPIRATORY_TRACT | 0 refills | Status: DC

## 2020-04-01 NOTE — Patient Instructions (Addendum)
Coping with Quitting Smoking  Quitting smoking is a physical and mental challenge. You will face cravings, withdrawal symptoms, and temptation. Before quitting, work with your health care provider to make a plan that can help you cope. Preparation can help you quit and keep you from giving in. How can I cope with cravings? Cravings usually last for 5-10 minutes. If you get through it, the craving will pass. Consider taking the following actions to help you cope with cravings:  Keep your mouth busy: ? Chew sugar-free gum. ? Suck on hard candies or a straw. ? Brush your teeth.  Keep your hands and body busy: ? Immediately change to a different activity when you feel a craving. ? Squeeze or play with a ball. ? Do an activity or a hobby, like making bead jewelry, practicing needlepoint, or working with wood. ? Mix up your normal routine. ? Take a short exercise break. Go for a quick walk or run up and down stairs. ? Spend time in public places where smoking is not allowed.  Focus on doing something kind or helpful for someone else.  Call a friend or family member to talk during a craving.  Join a support group.  Call a quit line, such as 1-800-QUIT-NOW.  Talk with your health care provider about medicines that might help you cope with cravings and make quitting easier for you. How can I deal with withdrawal symptoms? Your body may experience negative effects as it tries to get used to not having nicotine in the system. These effects are called withdrawal symptoms. They may include:  Feeling hungrier than normal.  Trouble concentrating.  Irritability.  Trouble sleeping.  Feeling depressed.  Restlessness and agitation.  Craving a cigarette. To manage withdrawal symptoms:  Avoid places, people, and activities that trigger your cravings.  Remember why you want to quit.  Get plenty of sleep.  Avoid coffee and other caffeinated drinks. These may worsen some of your  symptoms. How can I handle social situations? Social situations can be difficult when you are quitting smoking, especially in the first few weeks. To manage this, you can:  Avoid parties, bars, and other social situations where people might be smoking.  Avoid alcohol.  Leave right away if you have the urge to smoke.  Explain to your family and friends that you are quitting smoking. Ask for understanding and support.  Plan activities with friends or family where smoking is not an option. What are some ways I can cope with stress? Wanting to smoke may cause stress, and stress can make you want to smoke. Find ways to manage your stress. Relaxation techniques can help. For example:  Breathe slowly and deeply, in through your nose and out through your mouth.  Listen to soothing, relaxing music.  Talk with a family member or friend about your stress.  Light a candle.  Soak in a bath or take a shower.  Think about a peaceful place. What are some ways I can prevent weight gain? Be aware that many people gain weight after they quit smoking. However, not everyone does. To keep from gaining weight, have a plan in place before you quit and stick to the plan after you quit. Your plan should include:  Having healthy snacks. When you have a craving, it may help to: ? Eat plain popcorn, crunchy carrots, celery, or other cut vegetables. ? Chew sugar-free gum.  Changing how you eat: ? Eat small portion sizes at meals. ? Eat 4-6 small meals   throughout the day instead of 1-2 large meals a day. ? Be mindful when you eat. Do not watch television or do other things that might distract you as you eat.  Exercising regularly: ? Make time to exercise each day. If you do not have time for a long workout, do short bouts of exercise for 5-10 minutes several times a day. ? Do some form of strengthening exercise, like weight lifting, and some form of aerobic exercise, like running or swimming.  Drinking  plenty of water or other low-calorie or no-calorie drinks. Drink 6-8 glasses of water daily, or as much as instructed by your health care provider. Summary  Quitting smoking is a physical and mental challenge. You will face cravings, withdrawal symptoms, and temptation to smoke again. Preparation can help you as you go through these challenges.  You can cope with cravings by keeping your mouth busy (such as by chewing gum), keeping your body and hands busy, and making calls to family, friends, or a helpline for people who want to quit smoking.  You can cope with withdrawal symptoms by avoiding places where people smoke, avoiding drinks with caffeine, and getting plenty of rest.  Ask your health care provider about the different ways to prevent weight gain, avoid stress, and handle social situations. This information is not intended to replace advice given to you by your health care provider. Make sure you discuss any questions you have with your health care provider. Document Revised: 07/30/2017 Document Reviewed: 08/14/2016 Elsevier Patient Education  2020 [place].  Preparing for Knee Replacement Getting prepared before knee replacement surgery can make your recovery easier and more comfortable. This document provides some tips and guidelines that will help you prepare for your surgery. Talk with your health care provider so you can learn what to expect before, during, and after surgery. Ask questions if you do not understand something. To ease concerns about your financial responsibilities, call your insurance company as soon as you decide to have surgery. Ask how much of your surgery and hospital stay will be covered. Also ask about coverage for medical equipment, rehabilitation facilities, and home care. How should I arrange for help? In the first couple weeks after surgery, it will likely be harder for you to do some of your regular activities. You may get tired easily, and you will  have limited movement in your leg. Follow these guidelines to make sure you have all the help you need after your surgery:  Plan to have someone take you home from the hospital. Your health care provider will tell you how many days you can expect to be in the hospital.  Cancel all your work, caregiving, and volunteer responsibilities for at least 4-6 weeks after your surgery.  Plan to have someone stay with you day and night for the first week. This person should be someone you are comfortable with. You may need this person to help you with your exercises and personal care, such as bathing and using the toilet.  If you live alone, arrange for someone to take care of your home and pets for the first 4-6 weeks after surgery.  Arrange for drivers to take you to and from follow-up appointments, the grocery store, and other places you may need to go for at least 4-6 weeks.  Consider applying for a disabled parking permit. To get an application, contact the Department of Motor Vehicles or your health care provider's office. How should I prepare my home?  Pick a recovery spot, but do not plan on recovering in bed. Sitting upright is better for your health. You may want to use a recliner with a small table nearby. Place the items you use most frequently on that table. These may include the TV remote, a cordless phone, a cell phone, a book or laptop computer, and a water glass.  To see if you will be able to move around in your home with a walker, hold your hands out about 6 inches (15 cm) from your sides, and walk from your recovery spot to your kitchen and bathroom. Then walk from your bed to the bathroom. If you do not hit anything with your hands, you will have enough room for a walker.  Minimize the use of stairs after you return home to reduce your risk of falling or tripping.  Remove all clutter from your floors. Also remove any throw rugs. This will help you avoid tripping after your  surgery.  Move the items you use most often in your kitchen, bathroom, and bedroom to shelves and drawers that are at countertop height.  Prepare a few meals to freeze and reheat later.  Consider getting safety equipment that will be helpful during your recovery, such as: ? Grab bars added in the shower and near the toilet. ? A raised toilet seat to help you get on and off the toilet more easily. ? A tub or shower bench. How should I prepare my body?  Have a preoperative exam. ? During the exam, your health care provider will make sure that your body is healthy enough to safely have the surgery. ? When you go to the exam, bring a complete list of all your medicines and supplements, including herbs and vitamins. ? You may need to have additional tests to ensure your safety.  Have elective dental care and routine cleanings done before your surgery. Germs from anywhere in your body, including your mouth, can travel to your new joint and infect it. It is important that you do not have any dental work done for at least 3 months after your surgery.  Maintain a healthy diet. Do not change your diet before surgery unless your health care provider tells you to do that.  Do not use any products that contain nicotine or tobacco, such as cigarettes and e-cigarettes. These can delay bone healing after surgery. If you need help quitting, ask your health care provider.  Tell your health care provider if: ? You develop any skin infections or skin irritations. You may need to improve the condition of your skin before surgery. ? You have a fever, a cold, or any other illness in the week before your surgery.  Do not drink any alcohol for at least 48 hours before surgery.  The day before your surgery, follow instructions from your health care provider about showering, eating, drinking, and taking medicines. These directions are for your safety.  Talk to your health care provider about doing exercises  before your surgery. ? Be sure to follow the exercise program only as directed by your health care provider. ? Doing these exercises in the weeks before your surgery may help reduce pain and improve function after surgery. Summary  Getting prepared before knee replacement surgery can make your recovery easier and more comfortable.  Prepare your home and arrange for help at home.  Keep all of your preoperative appointments to ensure that you are ready for your surgery.  Plan to have someone take you  home from the hospital and stay with you day and night for the first week. This information is not intended to replace advice given to you by your health care provider. Make sure you discuss any questions you have with your health care provider. Document Revised: 10/04/2017 Document Reviewed: 10/04/2017 Elsevier Patient Education  2020 [place].  Total Knee Replacement  Total knee replacement is a procedure to replace the damaged knee joint with an artificial (prosthetic) knee joint. The purpose of this surgery is to reduce knee pain and improve knee function. The prosthetic knee joint (prosthesis) may be made of metal, plastic, or ceramic. It replaces parts of the thigh bone (femur), lower leg bone (tibia), and kneecap (patella) that are removed during the procedure. Tell a health care provider about:  Any allergies you have.  All medicines you are taking, including vitamins, herbs, eye drops, creams, and over-the-counter medicines.  Any problems you or family members have had with anesthetic medicines.  Any blood disorders you have.  Any surgeries you have had.  Any medical conditions you have.  Whether you are pregnant or may be pregnant. What are the risks? Generally, this is a safe procedure. However, problems may occur, including:  Infection.  Bleeding.  Blood clot.  Allergic reactions to medicines.  Damage to nerves or other structures.  Decreased range of motion  of the knee.  Instability of the knee.  Loosening of the prosthetic joint.  Knee pain that does not go away (chronic pain). What happens before the procedure? Staying hydrated Follow instructions from your health care provider about hydration, which may include:  Up to 2 hours before the procedure - you may continue to drink clear liquids, such as water, clear fruit juice, black coffee, and plain tea.  Eating and drinking restrictions Follow instructions from your health care provider about eating and drinking, which may include:  8 hours before the procedure - stop eating heavy meals or foods, such as meat, fried foods, or fatty foods.  6 hours before the procedure - stop eating light meals or foods, such as toast or cereal.  6 hours before the procedure - stop drinking milk or drinks that contain milk.  2 hours before the procedure - stop drinking clear liquids. Medicines Ask your health care provider about:  Changing or stopping your regular medicines. This is especially important if you are taking diabetes medicines or blood thinners.  Taking medicines such as aspirin and ibuprofen. These medicines can thin your blood. Do not take these medicines unless your health care provider tells you to take them.  Taking over-the-counter medicines, vitamins, herbs, and supplements. Tests and exams  You may have a physical exam.  You may have tests, such as: ? X-rays. ? MRI. ? CT scan. ? Bone scans.  You may have a blood or urine sample taken. Lifestyle   If your health care provider prescribes physical therapy, do exercises as instructed.  Maintain good body and oral hygiene. Germs from anywhere in your body can travel to your new joint and infect it. Tell your health care provider if you: ? Plan to have dental care and routine cleanings. ? Develop any skin infections.  If you are overweight, work with your health care provider to reach a safe weight. Extra weight can put  pressure on your knee.  Do not use any products that contain nicotine or tobacco, such as cigarettes, e-cigarettes, and chewing tobacco. These can delay healing after surgery. If you need help quitting,  ask your health care provider. General instructions  Plan to have someone take you home from the hospital or clinic.  Plan to have a responsible adult care for you for at least 24 hours after you leave the hospital or clinic. This is important. It is recommended that you have someone to help care for you for at least 4-6 weeks after your procedure.  Do not shave your legs just before surgery. If hair removal is needed, it will be done in the hospital.  Ask your health care provider how your surgical site will be marked or identified.  Ask your health care provider what steps will be taken to prevent infection. These may include: ? Removing hair at the surgery site. ? Washing skin with a germ-killing soap. What happens during the procedure?  An IV will be inserted into one of your veins.  You will be given one or more of the following: ? A medicine to help you relax (sedative). ? A medicine that is injected into an area of your body near the nerves to numb everything below the injection site (peripheral nerve block). ? A medicine that is injected into your spine to numb the area below and slightly above the injection site (spinal anesthetic). ? A medicine to make you fall asleep (general anesthetic).  An incision will be made in your knee.  Damaged cartilage and bone will be removed from your femur, tibia, and patella.  Parts of the prosthesis (liners) will be placed over the areas of bone and cartilage that were removed. A metal liner will be placed over your femur, and plastic liners will be placed over your tibia and the underside of your patella.  One or more small tubes (drains) may be placed near your incision to help drain extra fluid from your surgery site.  Your incision will  be closed with stitches (sutures), skin glue, or adhesive strips.  A bandage (dressing) will be placed over your incision. The procedure may vary among health care providers and hospitals. What happens after the procedure?  Your blood pressure, heart rate, breathing rate, and blood oxygen level will be monitored until you leave the hospital or clinic.  You will be given medicines to help manage pain.  You may: ? Continue to receive fluids through an IV. ? Have fluid coming from one or more drains in your incision. ? Have to wear compression stockings. These stockings help to prevent blood clots and reduce swelling in your legs. ? Be given a continuous passive motion machine to use. You will be shown how to use this machine.  You will be encouraged to move. A physical therapist will teach you how to use crutches or a walker. He or she will also teach you how to exercise at home.  Do not drive until your health care provider approves. Summary  Total knee replacement is a procedure to replace the knee joint with an artificial (prosthetic) knee joint.  Before the procedure, follow instructions from your health care provider about eating and drinking.  Plan to have someone take you home from the hospital or clinic. This information is not intended to replace advice given to you by your health care provider. Make sure you discuss any questions you have with your health care provider. Document Revised: 03/31/2018 Document Reviewed: 03/31/2018 Elsevier Patient Education  [place].

## 2020-04-01 NOTE — Progress Notes (Signed)
Established Patient Office Visit  Subjective:  Patient ID: [name], female    DOB: Aug 23, 1971  Age: 49 y.o. MRN: [id number]  CC:  Chief Complaint  Patient presents with  . Medication Refill    refill on nicotine inhaler. Patient needing approval sent to insurance for refill.     HPI [name] presents for follow-up of her smoking cessation efforts. She is taking the Nicotrol inhaler over the last year and has been smoke-free over the last 6 months. Insurance has asked her to follow-up with me. She did not understand that there would be a tapering. With the inhaler. Tells me that her right knee is worn out. There is bone-on-bone arthritis of that knee. She has had the Synvisc injections and they have not helped. Wants my opinion on possible replacement. She has been very active athletically over the years and has participated in several triathlons.  Past Medical History:  Diagnosis Date  . Bipolar disorder ([place])   . Gestational diabetes   . HSV (herpes simplex virus) anogenital infection   . Vaginal Pap smear, abnormal     Past Surgical History:  Procedure Laterality Date  . ANKLE RECONSTRUCTION Bilateral   . CESAREAN SECTION    . CESAREAN SECTION N/A 06/28/2014   Procedure: CESAREAN SECTION;  Surgeon: Olga Millers, MD;  Location: [facility];  Service: Obstetrics;  Laterality: N/A;  . LEEP      Family History  Problem Relation Age of Onset  . Depression Mother   . Bipolar disorder Mother   . Personality disorder Mother   . Diabetes Father        2  . Diabetes Mellitus II Father   . Alcohol abuse Father   . Pancreatic cancer Maternal Grandmother   . Hypertension Maternal Grandfather   . Heart disease Maternal Grandfather   . ADD / ADHD Son   . ODD Son     Social History   Socioeconomic History  . Marital status: Married    Spouse name: Not on file  . Number of children: Not on file  . Years of education: Not on file  . Highest education level: Not on  file  Occupational History    Employer: Architectural technologist   Tobacco Use  . Smoking status: Former Smoker    Packs/day: 0.50    Quit date: 03/06/2019    Years since quitting: 1.0  . Smokeless tobacco: Never Used  Substance and Sexual Activity  . Alcohol use: No    Comment: Last use 2019  . Drug use: No  . Sexual activity: Yes  Other Topics Concern  . Not on file  Social History Narrative  . Not on file   Social Determinants of Health   Financial Resource Strain:   . Difficulty of Paying Living Expenses:   Food Insecurity:   . Worried About Charity fundraiser in the Last Year:   . Arboriculturist in the Last Year:   Transportation Needs:   . Film/video editor (Medical):   Marland Kitchen Lack of Transportation (Non-Medical):   Physical Activity:   . Days of Exercise per Week:   . Minutes of Exercise per Session:   Stress:   . Feeling of Stress :   Social Connections:   . Frequency of Communication with Friends and Family:   . Frequency of Social Gatherings with Friends and Family:   . Attends Religious Services:   . Active Member of Clubs or  Organizations:   . Attends Archivist Meetings:   Marland Kitchen Marital Status:   Intimate Partner Violence:   . Fear of Current or Ex-Partner:   . Emotionally Abused:   Marland Kitchen Physically Abused:   . Sexually Abused:     Outpatient Medications Prior to Visit  Medication Sig Dispense Refill  . diclofenac (VOLTAREN) 50 MG EC tablet Take 50 mg by mouth 2 (two) times daily as needed.    . LamoTRIgine (LAMICTAL XR) 300 MG TB24 24 hour tablet Take 1 tablet (300 mg total) by mouth daily. 90 tablet 0  . Lurasidone HCl 60 MG TABS Take 1 tablet (60 mg total) by mouth daily with supper. 90 tablet 0  . OLANZapine (ZYPREXA) 10 MG tablet Take 15-20 mg by mouth at bedtime. Depending on sleep pattern    . NICOTROL 10 MG inhaler INHALE 1 CARTRIDGE (1 CONTINUOUS PUFFING TOTAL) INTO THE LUNGS AS NEEDED FOR SMOKING CESSATION. 168 each 1   No  facility-administered medications prior to visit.    No Known Allergies  ROS Review of Systems  Constitutional: Negative.   HENT: Negative.   Respiratory: Negative.   Cardiovascular: Negative.   Gastrointestinal: Negative.   Musculoskeletal: Positive for arthralgias, gait problem and joint swelling.  Neurological: Negative for weakness and numbness.      Objective:    Physical Exam Vitals and nursing note reviewed.  Constitutional:      General: She is not in acute distress.    Appearance: Normal appearance. She is normal weight. She is not ill-appearing, toxic-appearing or diaphoretic.  HENT:     Head: Normocephalic and atraumatic.     Right Ear: External ear normal.     Left Ear: External ear normal.  Eyes:     General: No scleral icterus.       Right eye: No discharge.        Left eye: No discharge.     Conjunctiva/sclera: Conjunctivae normal.  Cardiovascular:     Rate and Rhythm: Normal rate and regular rhythm.  Pulmonary:     Effort: Pulmonary effort is normal.     Breath sounds: Normal breath sounds.  Abdominal:     General: Bowel sounds are normal.  Musculoskeletal:     Right knee: Swelling and effusion (small) present. Tenderness present over the medial joint line.     Right lower leg: No edema.     Left lower leg: No edema.  Skin:    General: Skin is warm and dry.  Neurological:     Mental Status: She is alert and oriented to person, place, and time.  Psychiatric:        Mood and Affect: Mood normal.        Behavior: Behavior normal.     BP 116/68   Pulse 78   Temp 98.6 F (37 C) (Tympanic)   Ht 5\' 2"  (1.575 m)   Wt 141 lb 9.6 oz (64.2 kg)   SpO2 98%   BMI 25.90 kg/m  Wt Readings from Last 3 Encounters:  04/01/20 141 lb 9.6 oz (64.2 kg)  06/08/19 141 lb 4 oz (64.1 kg)  10/27/18 135 lb (61.2 kg)     Health Maintenance Due  Topic Date Due  . Hepatitis C Screening  Never done  . COVID-19 Vaccine (1) Never done  . PAP SMEAR-Modifier   09/01/2019  . INFLUENZA VACCINE  03/31/2020    There are no preventive care reminders to display for this patient.  Lab Results  Component Value Date   TSH 1.28 06/08/2019   Lab Results  Component Value Date   WBC 7.5 06/08/2019   HGB 11.9 06/08/2019   HCT 34.7 (L) 06/08/2019   MCV 89.9 06/08/2019   PLT 308 06/08/2019   Lab Results  Component Value Date   NA 138 06/08/2019   K 4.1 06/08/2019   CO2 25 06/08/2019   GLUCOSE 81 06/08/2019   BUN 14 06/08/2019   CREATININE 1.12 06/08/2019   BILITOT 0.4 06/08/2019   ALKPHOS 27 (L) 06/08/2019   AST 14 06/08/2019   ALT 12 06/08/2019   PROT 7.2 06/08/2019   ALBUMIN 4.5 06/08/2019   CALCIUM 10.0 06/08/2019   ANIONGAP 13 05/05/2017   GFR 52.29 (L) 06/08/2019   Lab Results  Component Value Date   CHOL 231 (H) 06/08/2019   Lab Results  Component Value Date   HDL 60.90 06/08/2019   Lab Results  Component Value Date   LDLCALC 155 (H) 06/08/2019   Lab Results  Component Value Date   TRIG 75.0 06/08/2019   Lab Results  Component Value Date   CHOLHDL 4 06/08/2019   Lab Results  Component Value Date   HGBA1C 5.4 10/20/2010      Assessment & Plan:   Problem List Items Addressed This Visit      Musculoskeletal and Integument   Primary osteoarthritis of right knee - Primary   Relevant Medications   diclofenac (VOLTAREN) 50 MG EC tablet     Other   Nicotine dependence   Relevant Medications   nicotine (NICOTROL) 10 MG inhaler   nicotine (NICOTROL) 10 MG inhaler   nicotine (NICOTROL) 10 MG inhaler   nicotine (NICOTROL) 10 MG inhaler      Meds ordered this encounter  Medications  . nicotine (NICOTROL) 10 MG inhaler    Sig: May use 5 daily for one month.    Dispense:  168 each    Refill:  1    Fill now.  . nicotine (NICOTROL) 10 MG inhaler    Sig: May use up to 4 daily for one month.    Dispense:  168 each    Refill:  0    Fill in 30 days.  . nicotine (NICOTROL) 10 MG inhaler    Sig: May use 3 daily  for one month    Dispense:  168 each    Refill:  0    Fill in 60 days.  . nicotine (NICOTROL) 10 MG inhaler    Sig: May use 2 daily for one month and then one daily for one month, then stop.    Dispense:  42 each    Refill:  0    Fill in 90 days.    Follow-up: Return if symptoms worsen or fail to improve.  Will wean the Nicotrol inhaler over 5 months. Was given information on smoking cessation as well as information on total knee replacement. She is aware that the total knee replacement usually lasts about 20 years. In her case it may last longer. She will discuss this with her orthopedic surgeon.  Libby Maw, MD

## 2020-04-02 ENCOUNTER — Telehealth: Payer: Self-pay | Admitting: Family Medicine

## 2020-04-02 DIAGNOSIS — Z720 Tobacco use: Secondary | ICD-10-CM

## 2020-04-02 NOTE — Telephone Encounter (Signed)
Patient is calling and wanted to speak to someone regarding medication, please advise. CB is [phone]

## 2020-04-02 NOTE — Telephone Encounter (Signed)
Returned patients call no answer, LMTCB 

## 2020-04-03 NOTE — Telephone Encounter (Signed)
Patient is returning a call to the office. Please give her a call back at [phone]. She said that she would be available after 1pm, she doesn't want to miss the call.

## 2020-04-03 NOTE — Telephone Encounter (Signed)
Spoke with patient who states that her insurance need something in writing about her needing Nicotrol to help with her quitting with smoking. Pt verbally understood I will reach out to her insurance.

## 2020-04-04 ENCOUNTER — Other Ambulatory Visit: Payer: Self-pay

## 2020-04-04 ENCOUNTER — Encounter: Payer: Self-pay | Admitting: Psychiatry

## 2020-04-04 ENCOUNTER — Ambulatory Visit (INDEPENDENT_AMBULATORY_CARE_PROVIDER_SITE_OTHER): Payer: BC Managed Care – PPO | Admitting: Psychiatry

## 2020-04-04 DIAGNOSIS — F3160 Bipolar disorder, current episode mixed, unspecified: Secondary | ICD-10-CM | POA: Diagnosis not present

## 2020-04-04 MED ORDER — LAMOTRIGINE ER 300 MG PO TB24: 300.0000 mg | ORAL_TABLET | Freq: Every day | ORAL | 0 refills | Status: DC

## 2020-04-04 MED ORDER — LURASIDONE HCL 60 MG PO TABS: 60.0000 mg | ORAL_TABLET | Freq: Every day | ORAL | 0 refills | Status: DC

## 2020-04-04 NOTE — Progress Notes (Signed)
   04/04/20 1115  Facial and Oral Movements  Muscles of Facial Expression 0  Lips and Perioral Area 0  Jaw 0  Tongue 0  Extremity Movements  Upper (arms, wrists, hands, fingers) 0  Lower (legs, knees, ankles, toes) 0  Trunk Movements  Neck, shoulders, hips 0  Overall Severity  Severity of abnormal movements (highest score from questions above) 0  Incapacitation due to abnormal movements 0  Patient's awareness of abnormal movements (rate only patient's report) 0  AIMS Total Score  AIMS Total Score 0

## 2020-04-04 NOTE — Progress Notes (Signed)
[name] [id number] 1971/08/30 49 y.o.  Subjective:   Patient ID:  [name] is a 49 y.o. (DOB 05/24/1971) female.  Chief Complaint:  Chief Complaint  Patient presents with  . Follow-up    Bipolar D/O, anxiety    HPI BRIE EPPARD presents to the office today for follow-up of bipolar disorder, anxiety, and insomnia. She reports that her mood has been stable, particularly in response to intense therapy sessions. She has had some anxiety in response to situational stress and this leads to feelings of frustration. Denies any panic s/s. Has some anticipatory anxiety with starting back to work and children returning to school and new routines. Denies depressed mood. She reports being alone since this can trigger depression. Notices that she forces herself to stay busy and not alone. She notices some increased energy with pursuing goals. She notices some irritability and reports that this is consistent with her baseline. Energy and motivation have been good. Sleeping well. Appetite has been ok. Has not noticed any recent concentration difficulties. She reports that she has impulses to get out and do things. Denies SI.   Returns to work next Tuesday. She reports that she and her husband have been doing EFT. Pt has also been seeing Dr. Eulas Post for therapy. She reports that this has been helpful for processing trauma. Son has been seeing Dr. Creig Hines.   She is concerned about running out of Nicotrol inhaler due to insurance non-coverage. Was smoking 2 ppd prior to smoking cessation. Youngest son is starting kindergarten.  Past Psychiatric Medication Trials: Lamictal- Has been helpful for mood. Reports that she has had fatigue with higher doses.  Lithium- Wt gain Depakote Carbamazepine Topamax Seroquel Latuda- Has taken since 2010. She reports that this has been helpful for her mood. Has not been on doses higher than 40 mg.  Olanzapine- Has been helpful for insomnia. Denies side effects.  Has taken since 2000 Trilafon Effexor XR Prozac [place]     Office Visit from 04/04/2020 in [facility] Visit from 01/25/2020 in [facility] Visit from 12/25/2019 in [facility] Total Score 0 0 0    PHQ2-9     Office Visit from 04/01/2020 in [facility] Visit from 09/23/2017 in [facility]  PHQ-2 Total Score 0 0       Review of Systems:  Review of Systems  Musculoskeletal: Negative for gait problem.       Knee pain  Neurological: Negative for tremors.  Psychiatric/Behavioral:       Please refer to HPI    Medications: I have reviewed the patient's current medications.  Current Outpatient Medications  Medication Sig Dispense Refill  . diclofenac (VOLTAREN) 50 MG EC tablet Take 50 mg by mouth 2 (two) times daily as needed.    . LamoTRIgine (LAMICTAL XR) 300 MG TB24 24 hour tablet Take 1 tablet (300 mg total) by mouth daily. 90 tablet 0  . Lurasidone HCl 60 MG TABS Take 1 tablet (60 mg total) by mouth daily with supper. 90 tablet 0  . nicotine (NICOTROL) 10 MG inhaler May use 5 daily for one month. 168 each 1  . nicotine (NICOTROL) 10 MG inhaler May use up to 4 daily for one month. 168 each 0  . nicotine (NICOTROL) 10 MG inhaler May use 3 daily for one month 168 each 0  . nicotine (NICOTROL) 10 MG inhaler May use 2 daily  for one month and then one daily for one month, then stop. 42 each 0  . OLANZapine (ZYPREXA) 10 MG tablet Take 15-20 mg by mouth at bedtime. Depending on sleep pattern     No current facility-administered medications for this visit.    Medication Side Effects: None  Allergies: No Known Allergies  Past Medical History:  Diagnosis Date  . Bipolar disorder ([place])   . Gestational diabetes   . HSV (herpes simplex virus) anogenital infection   . Vaginal Pap smear, abnormal     Family History  Problem Relation Age of Onset   . Depression Mother   . Bipolar disorder Mother   . Personality disorder Mother   . Diabetes Father        2  . Diabetes Mellitus II Father   . Alcohol abuse Father   . Pancreatic cancer Maternal Grandmother   . Hypertension Maternal Grandfather   . Heart disease Maternal Grandfather   . ADD / ADHD Son   . ODD Son     Social History   Socioeconomic History  . Marital status: Married    Spouse name: Not on file  . Number of children: Not on file  . Years of education: Not on file  . 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 Smoking status: Former Smoker    Packs/day: 0.50    Quit date: 03/06/2019    Years since quitting: 1.0  . Smokeless tobacco: Never Used  Substance and Sexual Activity  . Alcohol use: No    Comment: Last use 2019  . Drug use: No  . Sexual activity: Yes  Other Topics Concern  . Not on file  Social History Narrative  . Not on file   Social Determinants of Health   Financial Resource Strain:   . Difficulty of Paying Living Expenses:   Food Insecurity:   . Worried About Charity fundraiser in the Last Year:   . Arboriculturist in the Last Year:   Transportation Needs:   . Film/video editor (Medical):   Marland Kitchen Lack of Transportation (Non-Medical):   Physical Activity:   . Days of Exercise per Week:   . Minutes of Exercise per Session:   Stress:   . Feeling of Stress :   Social Connections:   . Frequency of Communication with Friends and Family:   . Frequency of Social Gatherings with Friends and Family:   . Attends Religious Services:   . Active Member of Clubs or Organizations:   . Attends Archivist Meetings:   Marland Kitchen Marital Status:   Intimate Partner Violence:   . Fear of Current or Ex-Partner:   . Emotionally Abused:   Marland Kitchen Physically Abused:   . Sexually Abused:     Past Medical History, Surgical history, Social history, and Family history were reviewed and  updated as appropriate.   Please see review of systems for further details on the patient's review from today.   Objective:   Physical Exam:  There were no vitals taken for this visit.  Physical Exam Constitutional:      General: She is not in acute distress. Musculoskeletal:        General: No deformity.  Neurological:     Mental Status: She is alert and oriented to person, place, and time.     Coordination: Coordination normal.  Psychiatric:  Attention and Perception: Attention and perception normal. She does not perceive auditory or visual hallucinations.        Mood and Affect: Mood normal. Mood is not anxious or depressed. Affect is not labile, blunt, angry or inappropriate.        Speech: Speech normal.        Behavior: Behavior normal.        Thought Content: Thought content normal. Thought content is not paranoid or delusional. Thought content does not include homicidal or suicidal ideation. Thought content does not include homicidal or suicidal plan.        Cognition and Memory: Cognition and memory normal.        Judgment: Judgment normal.     Comments: Insight intact     Lab Review:     Component Value Date/Time   NA 138 06/08/2019 1530   K 4.1 06/08/2019 1530   CL 103 06/08/2019 1530   CO2 25 06/08/2019 1530   GLUCOSE 81 06/08/2019 1530   BUN 14 06/08/2019 1530   CREATININE 1.12 06/08/2019 1530   CALCIUM 10.0 06/08/2019 1530   PROT 7.2 06/08/2019 1530   ALBUMIN 4.5 06/08/2019 1530   AST 14 06/08/2019 1530   ALT 12 06/08/2019 1530   ALKPHOS 27 (L) 06/08/2019 1530   BILITOT 0.4 06/08/2019 1530   GFRNONAA >60 05/05/2017 1125   GFRAA >60 05/05/2017 1125       Component Value Date/Time   WBC 7.5 06/08/2019 1530   RBC 3.86 06/08/2019 1530   HGB 11.9 06/08/2019 1530   HCT 34.7 (L) 06/08/2019 1530   PLT 308 06/08/2019 1530   MCV 89.9 06/08/2019 1530   MCH 30.8 06/08/2019 1530   MCHC 34.3 06/08/2019 1530   RDW 12.6 06/08/2019 1530   LYMPHSABS 3.1  12/14/2017 1447   MONOABS 0.7 12/14/2017 1447   EOSABS 0.1 12/14/2017 1447   BASOSABS 0.0 12/14/2017 1447    No results found for: POCLITH, LITHIUM   No results found for: PHENYTOIN, PHENOBARB, VALPROATE, CBMZ   .res Assessment: Plan:   Will continue current plan of care since target signs and symptoms are well controlled without any tolerability issues. Continue Lamictal XR 300 mg daily for mood stabilization. Continue Latuda 60 mg daily with supper. Continue olanzapine 10 mg 1-1/2-2 tabs at bedtime.  Patient reports that she continues to have refills remaining from previous psychiatric provider and will contact office with she needs a refill. Patient to follow-up in 3 months or sooner if clinically indicated. Patient advised to contact office with any questions, adverse effects, or acute worsening in signs and symptoms.  Kadeidra was seen today for follow-up.  Diagnoses and all orders for this visit:  Bipolar affective disorder, current episode mixed, current episode severity unspecified (HCC) -     LamoTRIgine (LAMICTAL XR) 300 MG TB24 24 hour tablet; Take 1 tablet (300 mg total) by mouth daily. -     Lurasidone HCl 60 MG TABS; Take 1 tablet (60 mg total) by mouth daily with supper.     Please see After Visit Summary for patient specific instructions.  Future Appointments  Date Time Provider [place]  07/08/2020  8:30 AM Thayer Headings, PMHNP [facility] None    No orders of the defined types were placed in this encounter.   -------------------------------

## 2020-04-05 NOTE — Telephone Encounter (Signed)
Patient called back to check on status of this authorization. I let her know that Lacie Scotts was waiting to hear back from insurance and will call her as soon as she can. Patient stated she will run out of this nicotrol the first of next week and needs refills asap.

## 2020-04-25 NOTE — Telephone Encounter (Signed)
Patient is calling back regarding previous message left regarding Nicotrol Gum and is requesting a call back, please advise. CB is [phone].

## 2020-05-01 NOTE — Telephone Encounter (Signed)
Patient is calling back and wanted to see if Dr. Ethelene Hal could give her a prescription for Nicorette Gum sent to [facility] in [place], please advise. Also patient is requesting a call back. CB is [phone]

## 2020-05-09 MED ORDER — NICOTINE POLACRILEX 2 MG MT GUM: 2.0000 mg | CHEWING_GUM | OROMUCOSAL | 0 refills | Status: DC | PRN

## 2020-05-09 NOTE — Telephone Encounter (Signed)
Patient calling to see if Rx for Nicorette Gum could be sent to her pharmacy. Please advise

## 2020-05-09 NOTE — Addendum Note (Signed)
Addended by: Jon Billings on: 05/09/2020 09:29 AM   Modules accepted: Orders

## 2020-05-28 ENCOUNTER — Telehealth: Payer: Self-pay | Admitting: Family Medicine

## 2020-05-28 NOTE — Telephone Encounter (Signed)
Patient calling for a referral to have a colonoscopy. Informed patient that colonoscopies are usually at age 47 unless family history of colon cancer. Per patient she would like to go ahead and have this done due to her family history of pancreatic cancer. Okay for referral?  Please advise.

## 2020-05-28 NOTE — Telephone Encounter (Signed)
Patient called to request GI referral for colonoscopy.

## 2020-05-29 NOTE — Telephone Encounter (Signed)
Happy to do it, but as with her husband, she will need to check with her insurance company first.

## 2020-05-29 NOTE — Addendum Note (Signed)
Addended by: Lynda Rainwater on: 05/29/2020 10:31 AM   Modules accepted: Orders

## 2020-05-29 NOTE — Telephone Encounter (Signed)
Patient aware of message below.

## 2020-05-31 ENCOUNTER — Encounter: Payer: Self-pay | Admitting: Gastroenterology

## 2020-06-25 ENCOUNTER — Telehealth: Payer: Self-pay | Admitting: Psychiatry

## 2020-06-25 DIAGNOSIS — F3160 Bipolar disorder, current episode mixed, unspecified: Secondary | ICD-10-CM

## 2020-06-25 MED ORDER — OLANZAPINE 10 MG PO TABS: ORAL_TABLET | ORAL | 0 refills | Status: DC

## 2020-06-25 NOTE — Telephone Encounter (Signed)
Okay to send in her refill for Zyprexa?

## 2020-06-25 NOTE — Telephone Encounter (Signed)
[name] called to request refill of her Zyprexa.  This is the first time we are prescribing this medication and the pharmacy sent the request to the old Dr.  She takes 15-20mg  every evening.  Please send to [facility] on [address] in [place].  appt 11/8

## 2020-07-08 ENCOUNTER — Ambulatory Visit: Payer: BC Managed Care – PPO | Admitting: Psychiatry

## 2020-07-29 ENCOUNTER — Encounter: Payer: BC Managed Care – PPO | Admitting: Gastroenterology

## 2020-08-10 ENCOUNTER — Other Ambulatory Visit: Payer: Self-pay | Admitting: Psychiatry

## 2020-08-10 DIAGNOSIS — F3160 Bipolar disorder, current episode mixed, unspecified: Secondary | ICD-10-CM

## 2020-08-14 ENCOUNTER — Ambulatory Visit (INDEPENDENT_AMBULATORY_CARE_PROVIDER_SITE_OTHER): Payer: BC Managed Care – PPO | Admitting: Psychiatry

## 2020-08-14 ENCOUNTER — Other Ambulatory Visit: Payer: Self-pay

## 2020-08-14 ENCOUNTER — Encounter: Payer: Self-pay | Admitting: Psychiatry

## 2020-08-14 DIAGNOSIS — F17210 Nicotine dependence, cigarettes, uncomplicated: Secondary | ICD-10-CM | POA: Diagnosis not present

## 2020-08-14 DIAGNOSIS — F3160 Bipolar disorder, current episode mixed, unspecified: Secondary | ICD-10-CM

## 2020-08-14 MED ORDER — LATUDA 60 MG PO TABS: 60.0000 mg | ORAL_TABLET | Freq: Every evening | ORAL | 1 refills | Status: DC

## 2020-08-14 MED ORDER — NICOTINE 10 MG IN INHA: RESPIRATORY_TRACT | 5 refills | Status: DC

## 2020-08-14 MED ORDER — LAMOTRIGINE ER 300 MG PO TB24: 300.0000 mg | ORAL_TABLET | Freq: Every day | ORAL | 1 refills | Status: DC

## 2020-08-14 MED ORDER — OLANZAPINE 10 MG PO TABS: ORAL_TABLET | ORAL | 1 refills | Status: DC

## 2020-08-14 NOTE — Progress Notes (Signed)
[name] [id number] Dec 27, 1970 49 y.o.  Subjective:   Patient ID:  [name] is a 49 y.o. (DOB 11/03/1970) female.  Chief Complaint:  Chief Complaint  Patient presents with  . Follow-up    Mood disturbance and anxiety.     HPI [name] presents to the office today for follow-up of mood d/o and anxiety. She reports that she has been doing well. "Mood-wise I am ok." She reports "my meds are fine." She reports that she has noticed some increase in shopping and returning items. Notices some slight rushing and feelings of frustration. She reports that she has had some anxiety. She notices that she will periodically hurry and rush. Sleeping "great" and averaging about 9 hours and reports that she requires 9-10 hours of sleep. Appetite has been good. Energy and motivation have been good. Concentration is ok with some distractions with balancing multiple responsibilities. Denies SI.   Denies AH or VH.   Has been using nicotrol inhaler and nicorette gum.   She reports that her son has been having some behavioral issues. Reports that her 47 yo has some oppositional defiant issues.   Seeing Dr. Eulas Post and Lottie Mussel.   Husband is taking FMLA for mental health issues. Attends catholic services. Has been baking for Christmas.  She reports that she most often taken Olanzapine 15 mg po QHS and will periodically take 20 mg po QHS if she notices manic s/s, irritability, and/or insomnia.    Past Psychiatric Medication Trials: Lamictal- Has been helpful for mood. Reports that she has had fatigue with higher doses.  Lithium- Wt gain Depakote Carbamazepine Topamax Seroquel Latuda- Has taken since 2010. She reports that this has been helpful for her mood. Has not been on doses higher than 40 mg.  Olanzapine- Has been helpful for insomnia. Denies side effects. Has taken since 2000 Trilafon Effexor XR Prozac [place] Office Visit from  08/14/2020 in [place] Office Visit from 04/04/2020 in [place] Office Visit from 01/25/2020 in [place] Visit from 12/25/2019 in [place] Total Score 0 0 0 0    PHQ2-9   [place] Office Visit from 04/01/2020 in [place] Visit from 09/23/2017 in [place]  PHQ-2 Total Score 0 0       Review of Systems:  Review of Systems  Musculoskeletal: Positive for arthralgias. Negative for gait problem.  Neurological: Negative for tremors.  Psychiatric/Behavioral:       Please refer to HPI    Medications: I have reviewed the patient's current medications.  Current Outpatient Medications  Medication Sig Dispense Refill  . celecoxib (CELEBREX) 200 MG capsule Take 200 mg by mouth daily.    . nicotine polacrilex (NICORETTE) 2 MG gum Take 1 each (2 mg total) by mouth as needed for smoking cessation. 100 tablet 0  . LamoTRIgine (LAMICTAL XR) 300 MG TB24 24 hour tablet Take 1 tablet (300 mg total) by mouth daily. 90 tablet 1  . Lurasidone HCl (LATUDA) 60 MG TABS Take 1 tablet (60 mg total) by mouth every evening. 90 tablet 1  . [START ON 08/31/2020] nicotine (NICOTROL) 10 MG inhaler May use 5 daily for one month. 168 each 5  . OLANZapine (ZYPREXA) 10 MG tablet Take 1.5-2 tabs po QHS 180 tablet 1   No current facility-administered medications for this visit.    Medication Side Effects: None  Allergies: No Known Allergies  Past Medical History:  Diagnosis Date  . Bipolar disorder ([place])   . Gestational diabetes   . HSV (herpes simplex virus) anogenital infection   . Vaginal Pap smear, abnormal     Family History  Problem Relation Age of Onset  . Depression Mother   . Bipolar disorder Mother   . Personality disorder Mother   . Diabetes Father        2  . Diabetes Mellitus II Father   . Alcohol abuse Father   . Pancreatic cancer Maternal  Grandmother   . Hypertension Maternal Grandfather   . Heart disease Maternal Grandfather   . ADD / ADHD Son   . ODD Son     Social History   Socioeconomic History  . Marital status: Married    Spouse name: Not on file  . Number of children: Not on file  . Years of education: Not on file  . 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 Smoking status: Former Smoker    Packs/day: 0.50    Quit date: 03/06/2019    Years since quitting: 1.4  . Smokeless tobacco: Never Used  Substance and Sexual Activity  . Alcohol use: No    Comment: Last use 2019  . Drug use: No  . Sexual activity: Yes  Other Topics Concern  . Not on file  Social History Narrative  . Not on file   Social Determinants of Health   Financial Resource Strain: Not on file  Food Insecurity: Not on file  Transportation Needs: Not on file  Physical Activity: Not on file  Stress: Not on file  Social Connections: Not on file  Intimate Partner Violence: Not on file    Past Medical History, Surgical history, Social history, and Family history were reviewed and updated as appropriate.   Please see review of systems for further details on the patient's review from today.   Objective:   Physical Exam:  There were no vitals taken for this visit.  Physical Exam Constitutional:      General: She is not in acute distress. Musculoskeletal:        General: No deformity.  Neurological:     Mental Status: She is alert and oriented to person, place, and time.     Coordination: Coordination normal.  Psychiatric:        Attention and Perception: Attention and perception normal. She does not perceive auditory or visual hallucinations.        Mood and Affect: Mood normal. Mood is not anxious or depressed. Affect is not labile, blunt, angry or inappropriate.        Speech: Speech normal.        Behavior: Behavior normal.        Thought Content: Thought  content normal. Thought content is not paranoid or delusional. Thought content does not include homicidal or suicidal ideation. Thought content does not include homicidal or suicidal plan.        Cognition and Memory: Cognition and memory normal.        Judgment: Judgment normal.     Comments: Insight intact     Lab Review:     Component Value Date/Time   NA 138 06/08/2019 1530   K 4.1 06/08/2019 1530   CL 103 06/08/2019 1530   CO2 25 06/08/2019 1530   GLUCOSE 81 06/08/2019 1530   BUN 14 06/08/2019 1530  CREATININE 1.12 06/08/2019 1530   CALCIUM 10.0 06/08/2019 1530   PROT 7.2 06/08/2019 1530   ALBUMIN 4.5 06/08/2019 1530   AST 14 06/08/2019 1530   ALT 12 06/08/2019 1530   ALKPHOS 27 (L) 06/08/2019 1530   BILITOT 0.4 06/08/2019 1530   GFRNONAA >60 05/05/2017 1125   GFRAA >60 05/05/2017 1125       Component Value Date/Time   WBC 7.5 06/08/2019 1530   RBC 3.86 06/08/2019 1530   HGB 11.9 06/08/2019 1530   HCT 34.7 (L) 06/08/2019 1530   PLT 308 06/08/2019 1530   MCV 89.9 06/08/2019 1530   MCH 30.8 06/08/2019 1530   MCHC 34.3 06/08/2019 1530   RDW 12.6 06/08/2019 1530   LYMPHSABS 3.1 12/14/2017 1447   MONOABS 0.7 12/14/2017 1447   EOSABS 0.1 12/14/2017 1447   BASOSABS 0.0 12/14/2017 1447    No results found for: POCLITH, LITHIUM   No results found for: PHENYTOIN, PHENOBARB, VALPROATE, CBMZ   .res Assessment: Plan:   Will continue current plan of care since target signs and symptoms are well controlled without any tolerability issues. Continue Latuda 60 mg po qd with evening meal. Continue Lamictal XR 300 mg po qd for mood stabilization. Continue Olanzapine 15-20 mg po QHS for mood stabilization and insomnia.  Continue Nicotrol for nicotine dependence.  Pt to follow-up in 4 months or sooner if clinically indicated.  Recommend continuing therapy. Patient advised to contact office with any questions, adverse effects, or acute worsening in signs and  symptoms.   Arelis was seen today for follow-up.  Diagnoses and all orders for this visit:  Cigarette nicotine dependence without complication -     nicotine (NICOTROL) 10 MG inhaler; May use 5 daily for one month.  Bipolar affective disorder, current episode mixed, current episode severity unspecified (HCC) -     Lurasidone HCl (LATUDA) 60 MG TABS; Take 1 tablet (60 mg total) by mouth every evening. -     LamoTRIgine (LAMICTAL XR) 300 MG TB24 24 hour tablet; Take 1 tablet (300 mg total) by mouth daily. -     OLANZapine (ZYPREXA) 10 MG tablet; Take 1.5-2 tabs po QHS     Please see After Visit Summary for patient specific instructions.  Future Appointments  Date Time Provider [place]  09/11/2020  9:00 AM [facility] PREVISIT RM [facility]  09/25/2020  9:00 AM Armbruster, Carlota Raspberry, MD [facility]  12/18/2020  8:30 AM Thayer Headings, PMHNP [facility] None    No orders of the defined types were placed in this encounter.   -------------------------------

## 2020-08-14 NOTE — Progress Notes (Signed)
°   08/14/20 0854  Facial and Oral Movements  Muscles of Facial Expression 0  Lips and Perioral Area 0  Jaw 0  Tongue 0  Extremity Movements  Upper (arms, wrists, hands, fingers) 0  Lower (legs, knees, ankles, toes) 0  Trunk Movements  Neck, shoulders, hips 0  Overall Severity  Severity of abnormal movements (highest score from questions above) 0  Incapacitation due to abnormal movements 0  Patient's awareness of abnormal movements (rate only patient's report) 0  AIMS Total Score  AIMS Total Score 0

## 2020-08-16 ENCOUNTER — Encounter: Payer: BC Managed Care – PPO | Admitting: Gastroenterology

## 2020-09-11 ENCOUNTER — Other Ambulatory Visit: Payer: Self-pay

## 2020-09-11 ENCOUNTER — Ambulatory Visit (AMBULATORY_SURGERY_CENTER): Payer: Self-pay

## 2020-09-11 VITALS — Ht 62.0 in | Wt 138.0 lb

## 2020-09-11 DIAGNOSIS — Z01818 Encounter for other preprocedural examination: Secondary | ICD-10-CM

## 2020-09-11 DIAGNOSIS — Z1211 Encounter for screening for malignant neoplasm of colon: Secondary | ICD-10-CM

## 2020-09-11 MED ORDER — SUTAB 1479-225-188 MG PO TABS: 12.0000 | ORAL_TABLET | ORAL | 0 refills | Status: DC

## 2020-09-11 NOTE — Progress Notes (Signed)
No allergies to soy or egg Pt is not on blood thinners or diet pills Denies issues with sedation/intubation Denies atrial flutter/fib Denies constipation   Emmi instructions given to pt  Pt is aware of Covid safety and care partner requirements.  

## 2020-09-19 ENCOUNTER — Encounter: Payer: Self-pay | Admitting: Gastroenterology

## 2020-09-25 ENCOUNTER — Encounter: Payer: BC Managed Care – PPO | Admitting: Gastroenterology

## 2020-11-13 ENCOUNTER — Ambulatory Visit (AMBULATORY_SURGERY_CENTER): Payer: BC Managed Care – PPO | Admitting: *Deleted

## 2020-11-13 ENCOUNTER — Other Ambulatory Visit: Payer: Self-pay

## 2020-11-13 ENCOUNTER — Encounter: Payer: Self-pay | Admitting: Gastroenterology

## 2020-11-13 VITALS — Ht 62.0 in | Wt 140.0 lb

## 2020-11-13 DIAGNOSIS — Z1211 Encounter for screening for malignant neoplasm of colon: Secondary | ICD-10-CM

## 2020-11-13 NOTE — Progress Notes (Signed)
Pt verified name, DOB, address and insurance during PV today. Pt mailed instruction packet to included paper to complete and mail back to [facility] with addressed and stamped envelope, Emmi video, copy of consent form to read and not return, and instructions.  PV completed over the phone. Pt encouraged to call with questions or issues.  My Chart instructions to pt as well    08/2020 had PV- has Sutab at home  No egg or soy allergy known to patient  No issues with past sedation with any surgeries or procedures Patient denies ever being told they had issues or difficulty with intubation  No FH of Malignant Hyperthermia No diet pills per patient No home 02 use per patient  No blood thinners per patient  Pt denies issues with constipation  No A fib or A flutter  EMMI video to pt or via [place] 19 guidelines implemented in PV today with Pt and RN  Pt is not  vaccinated  for Covid   Due to the COVID-19 pandemic we are asking patients to follow certain guidelines.  Pt aware of COVID protocols and [facility] guidelines

## 2020-11-27 ENCOUNTER — Ambulatory Visit (AMBULATORY_SURGERY_CENTER): Payer: BC Managed Care – PPO | Admitting: Gastroenterology

## 2020-11-27 ENCOUNTER — Encounter: Payer: Self-pay | Admitting: Gastroenterology

## 2020-11-27 ENCOUNTER — Other Ambulatory Visit: Payer: Self-pay

## 2020-11-27 VITALS — BP 145/61 | HR 68 | Temp 96.6°F | Resp 13 | Ht 62.0 in | Wt 140.0 lb

## 2020-11-27 DIAGNOSIS — K635 Polyp of colon: Secondary | ICD-10-CM

## 2020-11-27 DIAGNOSIS — D123 Benign neoplasm of transverse colon: Secondary | ICD-10-CM

## 2020-11-27 DIAGNOSIS — Z1211 Encounter for screening for malignant neoplasm of colon: Secondary | ICD-10-CM

## 2020-11-27 DIAGNOSIS — D124 Benign neoplasm of descending colon: Secondary | ICD-10-CM

## 2020-11-27 MED ORDER — SODIUM CHLORIDE 0.9 % IV SOLN: 500.0000 mL | Freq: Once | INTRAVENOUS | Status: AC

## 2020-11-27 NOTE — Op Note (Signed)
[name] Procedure Date: 11/27/2020 11:52 AM MRN: [id number] Endoscopist: Remo Lipps P. Havery Moros , MD Age: 50 Referring MD:  Date of Birth: July 08, 1971 Gender: Female Account #: [id number] Procedure:                Colonoscopy Indications:              Screening for colorectal malignant neoplasm, This                            is the patient's first colonoscopy Medicines:                Monitored Anesthesia Care Procedure:                Pre-Anesthesia Assessment:                           - Prior to the procedure, a History and Physical                            was performed, and patient medications and                            allergies were reviewed. The patient's tolerance of                            previous anesthesia was also reviewed. The risks                            and benefits of the procedure and the sedation                            options and risks were discussed with the patient.                            All questions were answered, and informed consent                            was obtained. Prior Anticoagulants: The patient has                            taken no previous anticoagulant or antiplatelet                            agents. ASA Grade Assessment: II - A patient with                            mild systemic disease. After reviewing the risks                            and benefits, the patient was deemed in                            satisfactory condition to undergo the procedure.  After obtaining informed consent, the colonoscope                            was passed under direct vision. Throughout the                            procedure, the patient's blood pressure, pulse, and                            oxygen saturations were monitored continuously. The                            Olympus [id number]-[id number] (#[id number]) Colonoscope was                            introduced through the  anus and advanced to the the                            cecum, identified by appendiceal orifice and                            ileocecal valve. The colonoscopy was performed                            without difficulty. The patient tolerated the                            procedure well. The quality of the bowel                            preparation was adequate. The ileocecal valve,                            appendiceal orifice, and rectum were photographed. Scope In: 11:59:20 AM Scope Out: 12:23:36 PM Scope Withdrawal Time: 0 hours 20 minutes 51 seconds  Total Procedure Duration: 0 hours 24 minutes 16 seconds  Findings:                 The perianal and digital rectal examinations were                            normal.                           A 7 mm polyp was found in the transverse colon. The                            polyp was sessile. The polyp was removed with a                            cold snare. Resection and retrieval were complete.                           A 2 to 3 mm polyp was found in the descending  colon. The polyp was sessile. The polyp was removed                            with a cold snare. Resection and retrieval were                            complete.                           Internal hemorrhoids were found during                            retroflexion. The hemorrhoids were small.                           Residual stool was noted throughout the colon. Time                            was taken to lavage the colon to achieve adequate                            views. The exam was otherwise without abnormality. Complications:            No immediate complications. Estimated blood loss:                            Minimal. Estimated Blood Loss:     Estimated blood loss was minimal. Impression:               - One 7 mm polyp in the transverse colon, removed                            with a cold snare. Resected and retrieved.                            - One 2 to 3 mm polyp in the descending colon,                            removed with a cold snare. Resected and retrieved.                           - Internal hemorrhoids.                           - The examination was otherwise normal. Recommendation:           - Patient has a contact number available for                            emergencies. The signs and symptoms of potential                            delayed complications were discussed with the  patient. Return to normal activities tomorrow.                            Written discharge instructions were provided to the                            patient.                           - Resume previous diet.                           - Continue present medications.                           - Await pathology results. Remo Lipps P. Havery Moros, MD 11/27/2020 12:26:58 PM This report has been signed electronically.

## 2020-11-27 NOTE — Patient Instructions (Signed)
Discharge instructions given. Handouts on polyps and hemorrhoids. Resume previous medications. YOU HAD AN ENDOSCOPIC PROCEDURE TODAY AT THE [facility]:   Refer to the procedure report that was given to you for any specific questions about what was found during the examination.  If the procedure report does not answer your questions, please call your gastroenterologist to clarify.  If you requested that your care partner not be given the details of your procedure findings, then the procedure report has been included in a sealed envelope for you to review at your convenience later.  YOU SHOULD EXPECT: Some feelings of bloating in the abdomen. Passage of more gas than usual.  Walking can help get rid of the air that was put into your GI tract during the procedure and reduce the bloating. If you had a lower endoscopy (such as a colonoscopy or flexible sigmoidoscopy) you may notice spotting of blood in your stool or on the toilet paper. If you underwent a bowel prep for your procedure, you may not have a normal bowel movement for a few days.  Please Note:  You might notice some irritation and congestion in your nose or some drainage.  This is from the oxygen used during your procedure.  There is no need for concern and it should clear up in a day or so.  SYMPTOMS TO REPORT IMMEDIATELY:  Following lower endoscopy (colonoscopy or flexible sigmoidoscopy):  Excessive amounts of blood in the stool  Significant tenderness or worsening of abdominal pains  Swelling of the abdomen that is new, acute  Fever of 100F or higher   For urgent or emergent issues, a gastroenterologist can be reached at any hour by calling [phone]. Do not use MyChart messaging for urgent concerns.    DIET:  We do recommend a small meal at first, but then you may proceed to your regular diet.  Drink plenty of fluids but you should avoid alcoholic beverages for 24 hours.  ACTIVITY:  You should plan to take it  easy for the rest of today and you should NOT DRIVE or use heavy machinery until tomorrow (because of the sedation medicines used during the test).    FOLLOW UP: Our staff will call the number listed on your records 48-72 hours following your procedure to check on you and address any questions or concerns that you may have regarding the information given to you following your procedure. If we do not reach you, we will leave a message.  We will attempt to reach you two times.  During this call, we will ask if you have developed any symptoms of COVID 19. If you develop any symptoms (ie: fever, flu-like symptoms, shortness of breath, cough etc.) before then, please call [phone].  If you test positive for Covid 19 in the 2 weeks post procedure, please call and report this information to us.    If any biopsies were taken you will be contacted by phone or by letter within the next 1-3 weeks.  Please call us at [phone] if you have not heard about the biopsies in 3 weeks.    SIGNATURES/CONFIDENTIALITY: You and/or your care partner have signed paperwork which will be entered into your electronic medical record.  These signatures attest to the fact that that the information above on your After Visit Summary has been reviewed and is understood.  Full responsibility of the confidentiality of this discharge information lies with you and/or your care-partner.  

## 2020-11-27 NOTE — Progress Notes (Signed)
Pt's states no medical or surgical changes since previsit or office visit. 

## 2020-11-27 NOTE — Progress Notes (Signed)
Called to room to assist during endoscopic procedure.  Patient ID and intended procedure confirmed with present staff. Received instructions for my participation in the procedure from the performing physician.  

## 2020-11-27 NOTE — Progress Notes (Signed)
PT taken to PACU. Monitors in place. VSS. Report given to RN. 

## 2020-11-27 NOTE — Progress Notes (Signed)
C.W. vital signs. 

## 2020-11-29 ENCOUNTER — Telehealth: Payer: Self-pay

## 2020-11-29 NOTE — Telephone Encounter (Signed)
First post procedure follow up call, no answer 

## 2020-11-29 NOTE — Telephone Encounter (Signed)
  Follow up Call-  Call back number 11/27/2020  Post procedure Call Back phone  # [phone]  Permission to leave phone message Yes  Some recent data might be hidden     Patient questions:  Do you have a fever, pain , or abdominal swelling? No. Pain Score  0 *  Have you tolerated food without any problems? Yes.    Have you been able to return to your normal activities? Yes.    Do you have any questions about your discharge instructions: Diet   No. Medications  No. Follow up visit  No.  Do you have questions or concerns about your Care? No.  Actions: * If pain score is 4 or above: No action needed, pain <4.  1. Have you developed a fever since your procedure? no  2.   Have you had an respiratory symptoms (SOB or cough) since your procedure? no  3.   Have you tested positive for COVID 19 since your procedure no  4.   Have you had any family members/close contacts diagnosed with the COVID 19 since your procedure?  no   If yes to any of these questions please route to Joylene John, RN and Joella Prince, RN

## 2020-12-03 ENCOUNTER — Telehealth: Payer: BC Managed Care – PPO | Admitting: Family Medicine

## 2020-12-03 DIAGNOSIS — R059 Cough, unspecified: Secondary | ICD-10-CM | POA: Diagnosis not present

## 2020-12-03 DIAGNOSIS — R0981 Nasal congestion: Secondary | ICD-10-CM

## 2020-12-03 NOTE — Progress Notes (Signed)
Virtual Visit via Video Note  I connected with [name]  on 12/03/20 at  1:00 PM EDT by a video enabled telemedicine application and verified that I am speaking with the correct person using two identifiers.  Location patient: home, [place] Location provider:work or home office Persons participating in the virtual visit: patient, provider  I discussed the limitations of evaluation and management by telemedicine and the availability of in person appointments. The patient expressed understanding and agreed to proceed.   HPI:  Acute telemedicine visit for : -Onset:2 days ago -Symptoms include: cough, congestion, swollen glands, PND, hoarseness, some sinus pressure -kids and husband were sick the last several days - 1 week  -Denies: CP, SOB, fevers, body aches, NVD, inability to eat/drink/get out of bed -Has tried:musinex -Pertinent past medical history: see below -Pertinent medication allergies: nkda -COVID-19 vaccine status: not vaccinated for covid; vaccinated for flu  ROS: See pertinent positives and negatives per HPI.  Past Medical History:  Diagnosis Date  . Arthritis    osteoarthritis rt knee  . Bipolar disorder ([place])   . Gestational diabetes    gestational diabetes 2009  . HSV (herpes simplex virus) anogenital infection   . Vaginal Pap smear, abnormal     Past Surgical History:  Procedure Laterality Date  . ANKLE RECONSTRUCTION Bilateral   . CESAREAN SECTION    . CESAREAN SECTION N/A 06/28/2014   Procedure: CESAREAN SECTION;  Surgeon: Olga Millers, MD;  Location: [facility];  Service: Obstetrics;  Laterality: N/A;  . CESAREAN SECTION    . LEEP       Current Outpatient Medications:  .  celecoxib (CELEBREX) 200 MG capsule, Take 200 mg by mouth daily., Disp: , Rfl:  .  LamoTRIgine (LAMICTAL XR) 300 MG TB24 24 hour tablet, Take 1 tablet (300 mg total) by mouth daily., Disp: 90 tablet, Rfl: 1 .  Lurasidone HCl (LATUDA) 60 MG TABS, Take 1 tablet (60 mg total) by mouth every  evening., Disp: 90 tablet, Rfl: 1 .  nicotine (NICOTROL) 10 MG inhaler, May use 5 daily for one month., Disp: 168 each, Rfl: 5 .  nicotine polacrilex (NICORETTE) 2 MG gum, Take 1 each (2 mg total) by mouth as needed for smoking cessation., Disp: 100 tablet, Rfl: 0 .  OLANZapine (ZYPREXA) 10 MG tablet, Take 1.5-2 tabs po QHS, Disp: 180 tablet, Rfl: 1  Current Facility-Administered Medications:  .  0.9 %  sodium chloride infusion, 500 mL, Intravenous, Once, Armbruster, Carlota Raspberry, MD  EXAM:  VITALS per patient if applicable:  GENERAL: alert, oriented, appears well and in no acute distress  HEENT: atraumatic, conjunttiva clear, no obvious abnormalities on inspection of external nose and ears  NECK: normal movements of the head and neck  LUNGS: on inspection no signs of respiratory distress, breathing rate appears normal, no obvious gross SOB, gasping or wheezing  CV: no obvious cyanosis  MS: moves all visible extremities without noticeable abnormality  PSYCH/NEURO: pleasant and cooperative, no obvious depression or anxiety, speech and thought processing grossly intact  ASSESSMENT AND PLAN:  Discussed the following assessment and plan:  Nasal congestion  Cough  -we discussed possible serious and likely etiologies, options for evaluation and workup, limitations of telemedicine visit vs in person visit, treatment, treatment risks, potential complications and precautions.  Query VURI, possible Covid19 vs other. Discussed options for testing and treatment of various etiologies. She if frustrated, as she reports she feels covid is political and that doctors are only pushing covid testing/vaccines and are  not willing to prescribe treatments for other ailments - she was hoping for prednisone or a zpack. Discussed risks/benefits/indications for these medications. Also, discussed options for cough, nasal congestion, sinus pressures - summarized in patient instructions. She declined cough  medication. Work/School slipped offered: declined Advised to seek prompt in person care if worsening, new symptoms arise, or if is not improving with treatment.    I discussed the assessment and treatment plan with the patient. The patient was provided an opportunity to ask questions and all were answered. The patient agreed with the plan and demonstrated an understanding of the instructions.     Lucretia Kern, DO

## 2020-12-03 NOTE — Patient Instructions (Signed)
  HOME CARE TIPS:  -[place] testing information: [email or web address]/ OR [phone] Most pharmacies also offer testing and home test kits. If you have a positive test, please do a follow up video visit with your doctor if you have high risk conditions to discuss treatment options. Treatment is best when given in the early stages of the illness.   -can use tylenol  if needed for fevers, aches and pains per instructions  -can use nasal saline a few times per day if you have nasal congestion; sometimes  a short course of Afrin nasal spray for 3 days can help with symptoms as well  -stay hydrated, drink plenty of fluids and eat small healthy meals - avoid dairy  -can take 1000 IU (34mcg) Vit D3 and 100-500 mg of Vit C daily per instructions  -follow up with your doctor in 2-3 days unless improving and feeling better  -stay home while sick  It was nice to meet you today, and I really hope you are feeling better soon. I help [facility] out with telemedicine visits on Tuesdays and Thursdays and am available for visits on those days. If you have any concerns or questions following this visit please schedule a follow up visit with your Primary Care doctor or seek care at a local urgent care clinic to avoid delays in care.    Seek in person care or schedule a follow up video visit promptly if your symptoms worsen, new concerns arise or you are not improving with treatment. Call 911 and/or seek emergency care if your symptoms are severe or life threatening.

## 2020-12-04 ENCOUNTER — Telehealth (INDEPENDENT_AMBULATORY_CARE_PROVIDER_SITE_OTHER): Payer: BC Managed Care – PPO | Admitting: Family Medicine

## 2020-12-04 ENCOUNTER — Encounter: Payer: Self-pay | Admitting: Family Medicine

## 2020-12-04 VITALS — Ht 62.0 in | Wt 140.0 lb

## 2020-12-04 DIAGNOSIS — B349 Viral infection, unspecified: Secondary | ICD-10-CM

## 2020-12-04 MED ORDER — PREDNISONE 10 MG PO TABS: 10.0000 mg | ORAL_TABLET | Freq: Two times a day (BID) | ORAL | 0 refills | Status: AC

## 2020-12-04 NOTE — Progress Notes (Signed)
Established Patient Office Visit  Subjective:  Patient ID: [name], female    DOB: September 14, 1970  Age: 50 y.o. MRN: [id number]  CC:  Chief Complaint  Patient presents with  . Sore Throat    Sore throat, sinus pressure, cough yellow greenish mucus symptoms x 2 days. Patient states that she had virtual visit yesterday with no antibiotics she would like something to help clear symptoms OTC medications not working.     HPI [name] presents for evaluation of URI symptoms to include Nasal congestion. Sinus pressure, pnd, sore throat, cough, lymph nodes feel swollen in neck. Denies changes in taste or smell. Denies diarrhea. Has tried sudaphed and robitussin and they are not helping. . Voice has changed. No nausea or vomit. No fever or headache other than sinus pressure.   Past Medical History:  Diagnosis Date  . Arthritis    osteoarthritis rt knee  . Bipolar disorder ([place])   . Gestational diabetes    gestational diabetes 2009  . HSV (herpes simplex virus) anogenital infection   . Vaginal Pap smear, abnormal     Past Surgical History:  Procedure Laterality Date  . ANKLE RECONSTRUCTION Bilateral   . CESAREAN SECTION    . CESAREAN SECTION N/A 06/28/2014   Procedure: CESAREAN SECTION;  Surgeon: Olga Millers, MD;  Location: [facility];  Service: Obstetrics;  Laterality: N/A;  . CESAREAN SECTION    . LEEP      Family History  Problem Relation Age of Onset  . Depression Mother   . Bipolar disorder Mother   . Personality disorder Mother   . Irritable bowel syndrome Mother   . Diabetes Father        2  . Diabetes Mellitus II Father   . Alcohol abuse Father   . Colon polyps Father 79  . Pancreatic cancer Maternal Grandmother   . Hypertension Maternal Grandfather   . Heart disease Maternal Grandfather   . ADD / ADHD Son   . ODD Son   . Colon cancer Neg Hx   . Esophageal cancer Neg Hx   . Stomach cancer Neg Hx   . Rectal cancer Neg Hx     Social History    Socioeconomic History  . Marital status: Married    Spouse name: Not on file  . Number of children: Not on file  . Years of education: Not on file  . 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 Smoking status: Former Smoker    Packs/day: 0.50    Quit date: 03/06/2019    Years since quitting: 1.7  . Smokeless tobacco: Never Used  Vaping Use  . Vaping Use: Never used  Substance and Sexual Activity  . Alcohol use: No    Comment: Last use 2019  . Drug use: No  . Sexual activity: Yes  Other Topics Concern  . Not on file  Social History Narrative  . Not on file   Social Determinants of Health   Financial Resource Strain: Not on file  Food Insecurity: Not on file  Transportation Needs: Not on file  Physical Activity: Not on file  Stress: Not on file  Social Connections: Not on file  Intimate Partner Violence: Not on file    Outpatient Medications Prior to Visit  Medication Sig Dispense Refill  . celecoxib (CELEBREX) 200 MG capsule Take 200 mg by mouth daily.    Marland Kitchen  LamoTRIgine (LAMICTAL XR) 300 MG TB24 24 hour tablet Take 1 tablet (300 mg total) by mouth daily. 90 tablet 1  . Lurasidone HCl (LATUDA) 60 MG TABS Take 1 tablet (60 mg total) by mouth every evening. 90 tablet 1  . nicotine (NICOTROL) 10 MG inhaler May use 5 daily for one month. 168 each 5  . nicotine polacrilex (NICORETTE) 2 MG gum Take 1 each (2 mg total) by mouth as needed for smoking cessation. 100 tablet 0  . OLANZapine (ZYPREXA) 10 MG tablet Take 1.5-2 tabs po QHS 180 tablet 1   Facility-Administered Medications Prior to Visit  Medication Dose Route Frequency Provider Last Rate Last Admin  . 0.9 %  sodium chloride infusion  500 mL Intravenous Once Armbruster, Carlota Raspberry, MD        No Known Allergies  ROS Review of Systems  Constitutional: Positive for fatigue. Negative for chills, diaphoresis, fever and unexpected weight change.  HENT:  Positive for congestion, postnasal drip, rhinorrhea, sinus pressure and voice change. Negative for trouble swallowing.   Eyes: Negative for photophobia and visual disturbance.  Respiratory: Positive for choking. Negative for shortness of breath and wheezing.   Cardiovascular: Negative.   Gastrointestinal: Negative for diarrhea, nausea and vomiting.  Endocrine: Negative for polyphagia and polyuria.  Musculoskeletal: Negative for arthralgias and myalgias.  Skin: Negative for pallor and rash.  Neurological: Negative for speech difficulty and headaches.  Hematological: Does not bruise/bleed easily.  Psychiatric/Behavioral: Negative.       Objective:    Physical Exam  Ht 5\' 2"  (1.575 m)   Wt 140 lb (63.5 kg)   LMP 11/04/2020   BMI 25.61 kg/m  Wt Readings from Last 3 Encounters:  12/04/20 140 lb (63.5 kg)  11/27/20 140 lb (63.5 kg)  11/13/20 140 lb (63.5 kg)     Health Maintenance Due  Topic Date Due  . Hepatitis C Screening  Never done  . COVID-19 Vaccine (1) Never done  . PAP SMEAR-Modifier  09/01/2019    There are no preventive care reminders to display for this patient.  Lab Results  Component Value Date   TSH 1.28 06/08/2019   Lab Results  Component Value Date   WBC 7.5 06/08/2019   HGB 11.9 06/08/2019   HCT 34.7 (L) 06/08/2019   MCV 89.9 06/08/2019   PLT 308 06/08/2019   Lab Results  Component Value Date   NA 138 06/08/2019   K 4.1 06/08/2019   CO2 25 06/08/2019   GLUCOSE 81 06/08/2019   BUN 14 06/08/2019   CREATININE 1.12 06/08/2019   BILITOT 0.4 06/08/2019   ALKPHOS 27 (L) 06/08/2019   AST 14 06/08/2019   ALT 12 06/08/2019   PROT 7.2 06/08/2019   ALBUMIN 4.5 06/08/2019   CALCIUM 10.0 06/08/2019   ANIONGAP 13 05/05/2017   GFR 52.29 (L) 06/08/2019   Lab Results  Component Value Date   CHOL 231 (H) 06/08/2019   Lab Results  Component Value Date   HDL 60.90 06/08/2019   Lab Results  Component Value Date   LDLCALC 155 (H) 06/08/2019   Lab  Results  Component Value Date   TRIG 75.0 06/08/2019   Lab Results  Component Value Date   CHOLHDL 4 06/08/2019   Lab Results  Component Value Date   HGBA1C 5.4 10/20/2010      Assessment & Plan:   Problem List Items Addressed This Visit   None   Visit Diagnoses    Acute viral syndrome    -  Primary   Relevant Medications   predniSONE (DELTASONE) 10 MG tablet      Meds ordered this encounter  Medications  . predniSONE (DELTASONE) 10 MG tablet    Sig: Take 1 tablet (10 mg total) by mouth 2 (two) times daily with a meal for 5 days.    Dispense:  10 tablet    Refill:  0    Follow-up: Return in about 1 week (around 12/11/2020), or if symptoms worsen or fail to improve. she will schedule covid test with pharmacy.   Libby Maw, MD

## 2020-12-04 NOTE — Patient Instructions (Signed)
Viral Illness, Adult Viruses are tiny germs that can get into a person's body and cause illness. There are many different types of viruses, and they cause many types of illness. Viral illnesses can range from mild to severe. They can affect various parts of the body. Short-term conditions that are caused by a virus include colds and the flu (influenza). Long-term conditions that are caused by a virus include herpes, shingles, and HIV (human immunodeficiency virus) infection. A few viruses have been linked to certain cancers. What are the causes? Many types of viruses can cause illness. Viruses invade cells in your body, multiply, and cause the infected cells to work abnormally or die. When these cells die, they release more of the virus. When this happens, you develop symptoms of the illness, and the virus continues to spread to other cells. If the virus takes over the function of the cell, it can cause the cell to divide and grow out of control. This happens when a virus causes cancer. Different viruses get into the body in different ways. You can get a virus by:  Swallowing food or water that has come in contact with the virus (is contaminated).  Breathing in droplets that have been coughed or sneezed into the air by an infected person.  Touching a surface that has been contaminated with the virus and then touching your eyes, nose, or mouth.  Being bitten by an insect or animal that carries the virus.  Having sexual contact with a person who is infected with the virus.  Being exposed to blood or fluids that contain the virus, either through an open cut or during a transfusion. If a virus enters your body, your body's defense system (immune system) will try to fight the virus. You may be at higher risk for a viral illness if your immune system is weak. What are the signs or symptoms? You may have these symptoms, depending on the type of virus and the location of the cells that it  invades:  Cold and flu viruses: ? Fever. ? Headache. ? Sore throat. ? Muscle aches. ? Stuffy nose (nasal congestion). ? Cough.  Digestive system (gastrointestinal) viruses: ? Fever. ? Pain in the abdomen. ? Nausea. ? Diarrhea.  Liver viruses (hepatitis): ? Loss of appetite. ? Tiredness. ? Skin or the white parts of your eyes turning yellow (jaundice).  Brain and spinal cord viruses: ? Fever. ? Headache. ? Stiff neck. ? Nausea and vomiting. ? Confusion or sleepiness.  Skin viruses: ? Warts. ? Itching. ? Rash.  Sexually transmitted viruses: ? Discharge. ? Swelling. ? Redness. ? Rash. How is this diagnosed? This condition may be diagnosed based on one or more of the following:  Symptoms.  Medical history.  Physical exam.  Blood test, sample of mucus from your lungs (sputum sample), stool sample, or a swab of body fluids or a skin sore (lesion). How is this treated? Viruses can be hard to treat because they live within cells. Antibiotic medicines do not treat viruses because these medicines do not get inside cells. Treatment for a viral illness may include:  Resting and drinking plenty of fluids.  Medicines to relieve symptoms. These can include over-the-counter medicine for pain and fever, medicines for cough or congestion, and medicines to relieve diarrhea.  Antiviral medicines. These medicines are available only for certain types of viruses. Some viral illnesses can be prevented with vaccinations. A common example is the flu shot. Follow these instructions at home: Medicines  Take over-the-counter and   prescription medicines only as told by your health care provider.  If you were prescribed an antiviral medicine, take it as told by your health care provider. Do not stop taking the antiviral even if you start to feel better.  Be aware of when antibiotics are needed and when they are not needed. Antibiotics do not treat viruses. You may get an antibiotic if  your health care provider thinks that you may have, or are at risk for, a bacterial infection and you have a viral infection. ? Do not ask for an antibiotic prescription if you have been diagnosed with a viral illness. Antibiotics will not make your illness go away faster. ? Frequently taking antibiotics when they are not needed can lead to antibiotic resistance. When this develops, the medicine no longer works against the bacteria that it normally fights. General instructions  Drink enough fluids to keep your urine pale yellow.  Rest as much as possible.  Return to your normal activities as told by your health care provider. Ask your health care provider what activities are safe for you.  Keep all follow-up visits as told by your health care provider. This is important.   How is this prevented? To reduce your risk of viral illness:  Wash your hands often with soap and water for at least 20 seconds. If soap and water are not available, use hand sanitizer.  Avoid touching your nose, eyes, and mouth, especially if you have not washed your hands recently.  If anyone in your household has a viral infection, clean all household surfaces that may have been in contact with the virus. Use soap and hot water. You may also use bleach that you have added water to (diluted).  Stay away from people who are sick with symptoms of a viral infection.  Do not share items such as toothbrushes and water bottles with other people.  Keep your vaccinations up to date. This includes getting a yearly flu shot.  Eat a healthy diet and get plenty of rest.   Contact a health care provider if:  You have symptoms of a viral illness that do not go away.  Your symptoms come back after going away.  Your symptoms get worse. Get help right away if you have:  Trouble breathing.  A severe headache or a stiff neck.  Severe vomiting or pain in your abdomen. These symptoms may represent a serious problem that is  an emergency. Do not wait to see if the symptoms will go away. Get medical help right away. Call your local emergency services (911 in the [place] Do not drive yourself to the hospital. Summary  Viruses are types of germs that can get into a person's body and cause illness. Viral illnesses can range from mild to severe. They can affect various parts of the body.  Viruses can be hard to treat. There are medicines to relieve symptoms, and there are some antiviral medicines.  If you were prescribed an antiviral medicine, take it as told by your health care provider. Do not stop taking the antiviral even if you start to feel better.  Contact a health care provider if you have symptoms of a viral illness that do not go away. This information is not intended to replace advice given to you by your health care provider. Make sure you discuss any questions you have with your health care provider. Document Revised: 01/01/2020 Document Reviewed: 06/27/2019 Elsevier Patient Education  2021 [employer]  

## 2020-12-05 ENCOUNTER — Telehealth: Payer: BC Managed Care – PPO | Admitting: Family Medicine

## 2020-12-18 ENCOUNTER — Ambulatory Visit: Payer: BC Managed Care – PPO | Admitting: Psychiatry

## 2021-01-21 ENCOUNTER — Encounter: Payer: BC Managed Care – PPO | Admitting: Family Medicine

## 2021-01-29 ENCOUNTER — Encounter: Payer: Self-pay | Admitting: Psychiatry

## 2021-01-29 ENCOUNTER — Other Ambulatory Visit: Payer: Self-pay

## 2021-01-29 ENCOUNTER — Ambulatory Visit (INDEPENDENT_AMBULATORY_CARE_PROVIDER_SITE_OTHER): Payer: BC Managed Care – PPO | Admitting: Psychiatry

## 2021-01-29 VITALS — Wt 135.0 lb

## 2021-01-29 DIAGNOSIS — F419 Anxiety disorder, unspecified: Secondary | ICD-10-CM | POA: Diagnosis not present

## 2021-01-29 DIAGNOSIS — F3160 Bipolar disorder, current episode mixed, unspecified: Secondary | ICD-10-CM | POA: Diagnosis not present

## 2021-01-29 NOTE — Progress Notes (Signed)
[name] [id number] August 10, 1971 50 y.o.  Subjective:   Patient ID:  [name] is a 50 y.o. (DOB 1970/11/06) female.  Chief Complaint:  Chief Complaint  Patient presents with  . Anxiety  . Medication Problem    Possible prolactinemia  . Other    Mood lability, irritability  . Depression  . Manic Behavior    HPI [name] presents to the office today for follow-up of anxiety and mood disturbance. She reports that she and her husband are separating and reached this decision in the last week. She reports that she and her husband met with a couples therapist. Husband is planning to move out and they have formulated a plan and set some ground rules. Her parents divorced when she was 26 yo.   She reports that she had some sadness yesterday in response to marital stressors. She reports that her mood has been "manic, not really taking care of myself correctly." She reports that she has had mood lability. She reports some excessive spending. Denies any other impulsivity. She reports sad mood and irritability.   She reports that she stayed up until 3:30 am getting her classes ready to go online at midnight tonight. She reports that she typically is getting 8-9 hours of sleep a night. She reports that she has not been eating regularly and is counting calories. She reports obsessive thoughts and is frequently looking at her calendar. Reports anxiety with driving and is constantly looking over her shoulder and in her rearview mirror. She reports feeling stressed and overwhelmed. She reports difficulty with concentration and is easily distracted. She reports racing thoughts. Denies SI.   She is seeing Dr. Eulas Post for therapy and husband is seeing Rodena Goldmann, [facility].   She will be having knee surgery later this month. She reports that she will not be able to drive for 4-6 weeks.   She reports some mild paranoia. Denies AH or VH.   Past Psychiatric Medication Trials: Lamictal- Has been  helpful for mood. Reports that she has had fatigue with higher doses.  Lithium- Wt gain Depakote Carbamazepine Topamax Seroquel Latuda- Has taken since 2010. She reports that this has been helpful for her mood. Has not been on doses higher than 40 mg.  Olanzapine- Has been helpful for insomnia. Denies side effects. Has taken since 2000 Trilafon Effexor XR Prozac Celexa Buspar Ambien   *Has never been on Abilify or [place] Office Visit from 08/14/2020 in [place] Visit from 04/04/2020 in [place] Visit from 01/25/2020 in [place] Visit from 12/25/2019 in [place] Total Score 0 0 0 0    PHQ2-9   Flowsheet Row Video Visit from 12/04/2020 in [place] Visit from 04/01/2020 in [place] Visit from 09/23/2017 in [place]  PHQ-2 Total Score 0 0 0       Review of Systems:  Review of Systems  Genitourinary: Positive for menstrual problem.       She reports elevated prolactin levels (23 and then 56 per pt report). She reports irregular periods. She reports that she has been lactating for the last 2 years.   Musculoskeletal: Negative for gait problem.       Knee pain  Neurological: Negative for tremors.  Psychiatric/Behavioral:       Please refer to HPI   Referred to Maryanna Shape endocrinology last week.  Medications: I have reviewed the patient's current medications.  Current Outpatient Medications  Medication Sig Dispense Refill  . celecoxib (CELEBREX) 200 MG capsule Take 200 mg by mouth daily.    . LamoTRIgine (LAMICTAL XR) 300 MG TB24 24 hour tablet Take 1 tablet (300 mg total) by mouth daily. 90 tablet 1  . Lurasidone HCl (LATUDA) 60 MG TABS Take 1 tablet (60 mg total) by mouth every evening. 90 tablet 1  . nicotine (NICOTROL) 10 MG inhaler May use 5 daily for one month.  168 each 5  . nicotine polacrilex (NICORETTE) 2 MG gum Take 1 each (2 mg total) by mouth as needed for smoking cessation. 100 tablet 0  . OLANZapine (ZYPREXA) 10 MG tablet Take 1.5-2 tabs po QHS 180 tablet 1  . polyethylene glycol (MIRALAX / GLYCOLAX) 17 g packet Take 17 g by mouth daily.     Current Facility-Administered Medications  Medication Dose Route Frequency Provider Last Rate Last Admin  . 0.9 %  sodium chloride infusion  500 mL Intravenous Once Armbruster, Carlota Raspberry, MD        Medication Side Effects: Other: Possible prolactinemia  Allergies: No Known Allergies  Past Medical History:  Diagnosis Date  . Arthritis    osteoarthritis rt knee  . Bipolar disorder ([place])   . Gestational diabetes    gestational diabetes 2009  . HSV (herpes simplex virus) anogenital infection   . Vaginal Pap smear, abnormal     Past Medical History, Surgical history, Social history, and Family history were reviewed and updated as appropriate.   Please see review of systems for further details on the patient's review from today.   Objective:   Physical Exam:  Wt 135 lb (61.2 kg)   BMI 24.69 kg/m   Physical Exam Constitutional:      General: She is not in acute distress. Musculoskeletal:        General: No deformity.  Neurological:     Mental Status: She is alert and oriented to person, place, and time.     Coordination: Coordination normal.  Psychiatric:        Attention and Perception: Attention and perception normal. She does not perceive auditory or visual hallucinations.        Mood and Affect: Mood is anxious and depressed. Affect is labile and tearful. Affect is not blunt, angry or inappropriate.        Speech: Speech normal.        Behavior: Behavior normal. Behavior is cooperative.        Thought Content: Thought content is paranoid. Thought content is not delusional. Thought content does not include homicidal or suicidal ideation. Thought content does not include homicidal or  suicidal plan.        Cognition and Memory: Cognition and memory normal.        Judgment: Judgment normal.     Comments: Insight intact     Lab Review:     Component Value Date/Time   NA 138 06/08/2019 1530   K 4.1 06/08/2019 1530   CL 103 06/08/2019 1530   CO2 25 06/08/2019 1530   GLUCOSE 81 06/08/2019 1530   BUN 14 06/08/2019 1530   CREATININE 1.12 06/08/2019 1530   CALCIUM 10.0 06/08/2019 1530   PROT 7.2 06/08/2019 1530   ALBUMIN 4.5 06/08/2019 1530   AST 14 06/08/2019 1530   ALT 12 06/08/2019 1530   ALKPHOS 27 (L) 06/08/2019 1530   BILITOT 0.4 06/08/2019 1530   GFRNONAA >60 05/05/2017  1125   GFRAA >60 05/05/2017 1125       Component Value Date/Time   WBC 7.5 06/08/2019 1530   RBC 3.86 06/08/2019 1530   HGB 11.9 06/08/2019 1530   HCT 34.7 (L) 06/08/2019 1530   PLT 308 06/08/2019 1530   MCV 89.9 06/08/2019 1530   MCH 30.8 06/08/2019 1530   MCHC 34.3 06/08/2019 1530   RDW 12.6 06/08/2019 1530   LYMPHSABS 3.1 12/14/2017 1447   MONOABS 0.7 12/14/2017 1447   EOSABS 0.1 12/14/2017 1447   BASOSABS 0.0 12/14/2017 1447    No results found for: POCLITH, LITHIUM   No results found for: PHENYTOIN, PHENOBARB, VALPROATE, CBMZ   .res Assessment: Plan:    Patient seen for 30 minutes and time spent counseling patient regarding hyperprolactinemia.  Discussed that hyperprolactinemia can be caused by antipsychotics and olanzapine and/or Latuda could be reason for elevated prolactin levels.  Agree with plan to see endocrinologist and discussed provider would be willing to coordinate care to address elevated prolactin levels.  Discussed that prolactin levels typically decrease when medication causing hyperprolactinemia is reduced or discontinued.  Agreed that dose reduction in antipsychotics would not be appropriate at this time since patient and her husband have decided on marital separation in the last several days and she is currently experiencing an exacerbation of mood and  anxiety signs and symptoms, along with some mild paranoia.  She also has upcoming knee surgery later this month and decrease in dosages of antipsychotics may lead to further worsening in mood and anxiety signs and symptoms and interfere with recovery.  Discussed considering dose reduction in antipsychotics after surgery and improvement in acute stressors.  Discussed that partial dopamine agonist antipsychotics may help improve prolactin levels and may wish to consider changing other antipsychotics to Abilify in the future. Patient plans to increase olanzapine from 15 mg to 20 mg at bedtime to improve acute mood and anxiety signs and symptoms, since this has been most effective for exacerbations in the past.  Also, nicotine usage increases the clearance of olanzapine. Will continue Latuda 60 mg in the evening for mood stabilization and paranoia. Continue lamotrigine XR 300 mg daily for mood stabilization. Patient to follow-up in 4 to 6 weeks or sooner if clinically indicated. Agree with plan to continue to see individual therapist and marital counselor. Patient advised to contact office with any questions, adverse effects, or acute worsening in signs and symptoms.    Cindia was seen today for anxiety, medication problem, other, depression and manic behavior.  Diagnoses and all orders for this visit:  Bipolar affective disorder, current episode mixed, current episode severity unspecified ([place])  Anxiety disorder, unspecified type     Please see After Visit Summary for patient specific instructions.  Future Appointments  Date Time Provider [place]  01/30/2021  2:30 PM Libby Maw, MD [facility]  03/14/2021 11:30 AM Thayer Headings, PMHNP [facility] None    No orders of the defined types were placed in this encounter.   -------------------------------

## 2021-01-30 ENCOUNTER — Encounter: Payer: Self-pay | Admitting: Family Medicine

## 2021-01-30 ENCOUNTER — Ambulatory Visit (INDEPENDENT_AMBULATORY_CARE_PROVIDER_SITE_OTHER): Payer: BC Managed Care – PPO | Admitting: Family Medicine

## 2021-01-30 VITALS — BP 116/72 | HR 82 | Temp 97.5°F | Ht 62.0 in | Wt 129.0 lb

## 2021-01-30 DIAGNOSIS — M1711 Unilateral primary osteoarthritis, right knee: Secondary | ICD-10-CM

## 2021-01-30 DIAGNOSIS — Z0000 Encounter for general adult medical examination without abnormal findings: Secondary | ICD-10-CM

## 2021-01-30 LAB — URINALYSIS, ROUTINE W REFLEX MICROSCOPIC
Bilirubin Urine: NEGATIVE
Hgb urine dipstick: NEGATIVE
Ketones, ur: NEGATIVE
Leukocytes,Ua: NEGATIVE
Nitrite: NEGATIVE
RBC / HPF: NONE SEEN (ref 0–?)
Specific Gravity, Urine: 1.015 (ref 1.000–1.030)
Total Protein, Urine: NEGATIVE
Urine Glucose: NEGATIVE
Urobilinogen, UA: 0.2 (ref 0.0–1.0)
pH: 7 (ref 5.0–8.0)

## 2021-01-30 NOTE — Progress Notes (Signed)
Established Patient Office Visit  Subjective:  Patient ID: [name], female    DOB: 12/27/1970  Age: 50 y.o. MRN: [id number]  CC:  Chief Complaint  Patient presents with  . Annual Exam    CPE, no concerns. Need pre-op form filled out for right knee surgery.     HPI [name] presents for a physical exam and clearance for partial right knee replacement surgery.  She is doing well.  Knee has been bothering her for quite some time.  She is maxed out on cortisone injections.  In her past she has been an avid athlete.  Quit smoking 2 years ago!  Last Pap was in March of this year.  She brings in a CMP and CBC drawn 2 weeks ago.  I visualized both and they were normal.  She is fasting today.  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  Past Medical History:  Diagnosis Date  . Arthritis    osteoarthritis rt knee  . Bipolar disorder ([place])   . Gestational diabetes    gestational diabetes 2009  . HSV (herpes simplex virus) anogenital infection   . Vaginal Pap smear, abnormal     Past Surgical History:  Procedure Laterality Date  . ANKLE RECONSTRUCTION Bilateral   . CESAREAN SECTION    . CESAREAN SECTION N/A 06/28/2014   Procedure: CESAREAN SECTION;  Surgeon: Olga Millers, MD;  Location: [facility];  Service: Obstetrics;  Laterality: N/A;  . CESAREAN SECTION    . LEEP      Family History  Problem Relation Age of Onset  . Depression Mother   . Bipolar disorder Mother   . Personality disorder Mother   . Irritable bowel syndrome Mother   . Diabetes Father        2  . Diabetes Mellitus II Father   . Alcohol abuse Father   . Colon polyps Father 24  . Pancreatic cancer Maternal Grandmother   . Hypertension Maternal Grandfather   . Heart disease Maternal Grandfather   . ADD / ADHD Son   . ODD Son   . Colon cancer Neg Hx   . Esophageal cancer Neg Hx   . Stomach cancer Neg Hx   . Rectal cancer Neg Hx     Social History   Socioeconomic History  . Marital status: Married     Spouse name: Not on file  . Number of children: Not on file  . Years of education: Not on file  . 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 Smoking status: Former Smoker    Packs/day: 0.50    Quit date: 03/06/2019    Years since quitting: 1.9  . Smokeless tobacco: Never Used  Vaping Use  . Vaping Use: Never used  Substance and Sexual Activity  . Alcohol use: No    Comment: Last use 2019  . Drug use: No  . Sexual activity: Yes  Other Topics Concern  . Not on file  Social History Narrative  . Not on file   Social Determinants of Health   Financial Resource Strain: Not on file  Food Insecurity: Not on file  Transportation Needs: Not on file  Physical Activity: Not on file  Stress: Not on file  Social Connections: Not on file  Intimate Partner Violence: Not on file    Outpatient Medications Prior to Visit  Medication Sig Dispense Refill  . celecoxib (CELEBREX)  200 MG capsule Take 200 mg by mouth daily.    . LamoTRIgine (LAMICTAL XR) 300 MG TB24 24 hour tablet Take 1 tablet (300 mg total) by mouth daily. 90 tablet 1  . Lurasidone HCl (LATUDA) 60 MG TABS Take 1 tablet (60 mg total) by mouth every evening. 90 tablet 1  . nicotine (NICOTROL) 10 MG inhaler May use 5 daily for one month. 168 each 5  . nicotine polacrilex (NICORETTE) 2 MG gum Take 1 each (2 mg total) by mouth as needed for smoking cessation. 100 tablet 0  . OLANZapine (ZYPREXA) 10 MG tablet Take 1.5-2 tabs po QHS 180 tablet 1  . polyethylene glycol (MIRALAX / GLYCOLAX) 17 g packet Take 17 g by mouth daily.     Facility-Administered Medications Prior to Visit  Medication Dose Route Frequency Provider Last Rate Last Admin  . 0.9 %  sodium chloride infusion  500 mL Intravenous Once Armbruster, Carlota Raspberry, MD        No Known Allergies  ROS Review of Systems  Constitutional: Negative.   HENT: Negative.   Eyes: Negative for photophobia and  visual disturbance.  Respiratory: Negative.   Cardiovascular: Negative.   Gastrointestinal: Negative.   Genitourinary: Negative.   Musculoskeletal: Positive for arthralgias.  Skin: Negative for color change and pallor.  Allergic/Immunologic: Negative for immunocompromised state.  Neurological: Negative.   Hematological: Does not bruise/bleed easily.  Psychiatric/Behavioral: Negative.       Objective:    Physical Exam Vitals and nursing note reviewed.  Constitutional:      General: She is not in acute distress.    Appearance: Normal appearance. She is normal weight. She is not ill-appearing, toxic-appearing or diaphoretic.  HENT:     Head: Normocephalic and atraumatic.     Right Ear: Tympanic membrane, ear canal and external ear normal.     Left Ear: Tympanic membrane, ear canal and external ear normal.     Mouth/Throat:     Mouth: Mucous membranes are moist.     Pharynx: Oropharynx is clear. No oropharyngeal exudate or posterior oropharyngeal erythema.  Eyes:     General: No scleral icterus.       Right eye: No discharge.        Left eye: No discharge.     Extraocular Movements: Extraocular movements intact.     Conjunctiva/sclera: Conjunctivae normal.     Pupils: Pupils are equal, round, and reactive to light.  Neck:     Vascular: No carotid bruit.  Cardiovascular:     Rate and Rhythm: Normal rate and regular rhythm.     Pulses:          Carotid pulses are 2+ on the right side and 2+ on the left side.      Dorsalis pedis pulses are 2+ on the right side and 2+ on the left side.       Posterior tibial pulses are 2+ on the right side and 2+ on the left side.  Pulmonary:     Effort: Pulmonary effort is normal.     Breath sounds: Normal breath sounds.  Abdominal:     General: Bowel sounds are normal.  Musculoskeletal:     Cervical back: No rigidity or tenderness.     Right knee: Swelling and effusion present.       Legs:  Lymphadenopathy:     Cervical: No cervical  adenopathy.  Skin:    General: Skin is warm and dry.  Neurological:     Mental  Status: She is alert and oriented to person, place, and time.  Psychiatric:        Mood and Affect: Mood normal.        Behavior: Behavior normal.     BP 116/72   Pulse 82   Temp (!) 97.5 F (36.4 C) (Temporal)   Ht 5\' 2"  (1.575 m)   Wt 129 lb (58.5 kg)   SpO2 98%   BMI 23.59 kg/m  Wt Readings from Last 3 Encounters:  01/30/21 129 lb (58.5 kg)  12/04/20 140 lb (63.5 kg)  11/27/20 140 lb (63.5 kg)     Health Maintenance Due  Topic Date Due  . Hepatitis C Screening  Never done    There are no preventive care reminders to display for this patient.  Lab Results  Component Value Date   TSH 1.28 06/08/2019   Lab Results  Component Value Date   WBC 7.5 06/08/2019   HGB 11.9 06/08/2019   HCT 34.7 (L) 06/08/2019   MCV 89.9 06/08/2019   PLT 308 06/08/2019   Lab Results  Component Value Date   NA 138 06/08/2019   K 4.1 06/08/2019   CO2 25 06/08/2019   GLUCOSE 81 06/08/2019   BUN 14 06/08/2019   CREATININE 1.12 06/08/2019   BILITOT 0.4 06/08/2019   ALKPHOS 27 (L) 06/08/2019   AST 14 06/08/2019   ALT 12 06/08/2019   PROT 7.2 06/08/2019   ALBUMIN 4.5 06/08/2019   CALCIUM 10.0 06/08/2019   ANIONGAP 13 05/05/2017   GFR 52.29 (L) 06/08/2019   Lab Results  Component Value Date   CHOL 231 (H) 06/08/2019   Lab Results  Component Value Date   HDL 60.90 06/08/2019   Lab Results  Component Value Date   LDLCALC 155 (H) 06/08/2019   Lab Results  Component Value Date   TRIG 75.0 06/08/2019   Lab Results  Component Value Date   CHOLHDL 4 06/08/2019   Lab Results  Component Value Date   HGBA1C 5.4 10/20/2010      Assessment & Plan:   Problem List Items Addressed This Visit      Musculoskeletal and Integument   Primary osteoarthritis of right knee   Relevant Orders   Urinalysis, Routine w reflex microscopic   Lipid panel    Other Visit Diagnoses    Healthcare  maintenance    -  Primary      No orders of the defined types were placed in this encounter.   Follow-up: Return in about 1 year (around 01/30/2022).  Patient will proceed with partial knee replacement on the right.  Given information on health maintenance and disease prevention.  Libby Maw, MD

## 2021-01-30 NOTE — Patient Instructions (Signed)
Health Maintenance, Female Adopting a healthy lifestyle and getting preventive care are important in promoting health and wellness. Ask your health care provider about:  The right schedule for you to have regular tests and exams.  Things you can do on your own to prevent diseases and keep yourself healthy. What should I know about diet, weight, and exercise? Eat a healthy diet  Eat a diet that includes plenty of vegetables, fruits, low-fat dairy products, and lean protein.  Do not eat a lot of foods that are high in solid fats, added sugars, or sodium.   Maintain a healthy weight Body mass index (BMI) is used to identify weight problems. It estimates body fat based on height and weight. Your health care provider can help determine your BMI and help you achieve or maintain a healthy weight. Get regular exercise Get regular exercise. This is one of the most important things you can do for your health. Most adults should:  Exercise for at least 150 minutes each week. The exercise should increase your heart rate and make you sweat (moderate-intensity exercise).  Do strengthening exercises at least twice a week. This is in addition to the moderate-intensity exercise.  Spend less time sitting. Even light physical activity can be beneficial. Watch cholesterol and blood lipids Have your blood tested for lipids and cholesterol at 48 years of age, then have this test every 5 years. Have your cholesterol levels checked more often if:  Your lipid or cholesterol levels are high.  You are older than 48 years of age.  You are at high risk for heart disease. What should I know about cancer screening? Depending on your health history and family history, you may need to have cancer screening at various ages. This may include screening for:  Breast cancer.  Cervical cancer.  Colorectal cancer.  Skin cancer.  Lung cancer. What should I know about heart disease, diabetes, and high blood  pressure? Blood pressure and heart disease  High blood pressure causes heart disease and increases the risk of stroke. This is more likely to develop in people who have high blood pressure readings, are of African descent, or are overweight.  Have your blood pressure checked: ? Every 3-5 years if you are 59-59 years of age. ? Every year if you are 63 years old or older. Diabetes Have regular diabetes screenings. This checks your fasting blood sugar level. Have the screening done:  Once every three years after age 58 if you are at a normal weight and have a low risk for diabetes.  More often and at a younger age if you are overweight or have a high risk for diabetes. What should I know about preventing infection? Hepatitis B If you have a higher risk for hepatitis B, you should be screened for this virus. Talk with your health care provider to find out if you are at risk for hepatitis B infection. Hepatitis C Testing is recommended for:  Everyone born from [birth year] through 1963.  Anyone with known risk factors for hepatitis C. Sexually transmitted infections (STIs)  Get screened for STIs, including gonorrhea and chlamydia, if: ? You are sexually active and are younger than 48 years of age. ? You are older than 48 years of age and your health care provider tells you that you are at risk for this type of infection. ? Your sexual activity has changed since you were last screened, and you are at increased risk for chlamydia or gonorrhea. Ask your health care provider  if you are at risk.  Ask your health care provider about whether you are at high risk for HIV. Your health care provider may recommend a prescription medicine to help prevent HIV infection. If you choose to take medicine to prevent HIV, you should first get tested for HIV. You should then be tested every 3 months for as long as you are taking the medicine. Pregnancy  If you are about to stop having your period (premenopausal) and  you may become pregnant, seek counseling before you get pregnant.  Take 400 to 800 micrograms (mcg) of folic acid every day if you become pregnant.  Ask for birth control (contraception) if you want to prevent pregnancy. Osteoporosis and menopause Osteoporosis is a disease in which the bones lose minerals and strength with aging. This can result in bone fractures. If you are 62 years old or older, or if you are at risk for osteoporosis and fractures, ask your health care provider if you should:  Be screened for bone loss.  Take a calcium or vitamin D supplement to lower your risk of fractures.  Be given hormone replacement therapy (HRT) to treat symptoms of menopause. Follow these instructions at home: Lifestyle  Do not use any products that contain nicotine or tobacco, such as cigarettes, e-cigarettes, and chewing tobacco. If you need help quitting, ask your health care provider.  Do not use street drugs.  Do not share needles.  Ask your health care provider for help if you need support or information about quitting drugs. Alcohol use  Do not drink alcohol if: ? Your health care provider tells you not to drink. ? You are pregnant, may be pregnant, or are planning to become pregnant.  If you drink alcohol: ? Limit how much you use to 0-1 drink a day. ? Limit intake if you are breastfeeding.  Be aware of how much alcohol is in your drink. In the [place] one drink equals one 12 oz bottle of beer (355 mL), one 5 oz glass of wine (148 mL), or one 1 oz glass of hard liquor (44 mL). General instructions  Schedule regular health, dental, and eye exams.  Stay current with your vaccines.  Tell your health care provider if: ? You often feel depressed. ? You have ever been abused or do not feel safe at home. Summary  Adopting a healthy lifestyle and getting preventive care are important in promoting health and wellness.  Follow your health care provider's instructions about healthy  diet, exercising, and getting tested or screened for diseases.  Follow your health care provider's instructions on monitoring your cholesterol and blood pressure. This information is not intended to replace advice given to you by your health care provider. Make sure you discuss any questions you have with your health care provider. Document Revised: 08/10/2018 Document Reviewed: 08/10/2018 Elsevier Patient Education  2021 [employer]  Preventive Care 39-27 Years Old, Female Preventive care refers to lifestyle choices and visits with your health care provider that can promote health and wellness. This includes:  A yearly physical exam. This is also called an annual wellness visit.  Regular dental and eye exams.  Immunizations.  Screening for certain conditions.  Healthy lifestyle choices, such as: ? Eating a healthy diet. ? Getting regular exercise. ? Not using drugs or products that contain nicotine and tobacco. ? Limiting alcohol use. What can I expect for my preventive care visit? Physical exam Your health care provider will check your:  Height and weight. These may  be used to calculate your BMI (body mass index). BMI is a measurement that tells if you are at a healthy weight.  Heart rate and blood pressure.  Body temperature.  Skin for abnormal spots. Counseling Your health care provider may ask you questions about your:  Past medical problems.  Family's medical history.  Alcohol, tobacco, and drug use.  Emotional well-being.  Home life and relationship well-being.  Sexual activity.  Diet, exercise, and sleep habits.  Work and work Statistician.  Access to firearms.  Method of birth control.  Menstrual cycle.  Pregnancy history. What immunizations do I need? Vaccines are usually given at various ages, according to a schedule. Your health care provider will recommend vaccines for you based on your age, medical history, and lifestyle or other factors,  such as travel or where you work.   What tests do I need? Blood tests  Lipid and cholesterol levels. These may be checked every 5 years, or more often if you are over 65 years old.  Hepatitis C test.  Hepatitis B test. Screening  Lung cancer screening. You may have this screening every year starting at age 90 if you have a 30-pack-year history of smoking and currently smoke or have quit within the past 15 years.  Colorectal cancer screening. ? All adults should have this screening starting at age 16 and continuing until age 38. ? Your health care provider may recommend screening at age 30 if you are at increased risk. ? You will have tests every 1-10 years, depending on your results and the type of screening test.  Diabetes screening. ? This is done by checking your blood sugar (glucose) after you have not eaten for a while (fasting). ? You may have this done every 1-3 years.  Mammogram. ? This may be done every 1-2 years. ? Talk with your health care provider about when you should start having regular mammograms. This may depend on whether you have a family history of breast cancer.  BRCA-related cancer screening. This may be done if you have a family history of breast, ovarian, tubal, or peritoneal cancers.  Pelvic exam and Pap test. ? This may be done every 3 years starting at age 58. ? Starting at age 60, this may be done every 5 years if you have a Pap test in combination with an HPV test. Other tests  STD (sexually transmitted disease) testing, if you are at risk.  Bone density scan. This is done to screen for osteoporosis. You may have this scan if you are at high risk for osteoporosis. Talk with your health care provider about your test results, treatment options, and if necessary, the need for more tests. Follow these instructions at home: Eating and drinking  Eat a diet that includes fresh fruits and vegetables, whole grains, lean protein, and low-fat dairy  products.  Take vitamin and mineral supplements as recommended by your health care provider.  Do not drink alcohol if: ? Your health care provider tells you not to drink. ? You are pregnant, may be pregnant, or are planning to become pregnant.  If you drink alcohol: ? Limit how much you have to 0-1 drink a day. ? Be aware of how much alcohol is in your drink. In the [place] one drink equals one 12 oz bottle of beer (355 mL), one 5 oz glass of wine (148 mL), or one 1 oz glass of hard liquor (44 mL).   Lifestyle  Take daily care of your teeth and  gums. Brush your teeth every morning and night with fluoride toothpaste. Floss one time each day.  Stay active. Exercise for at least 30 minutes 5 or more days each week.  Do not use any products that contain nicotine or tobacco, such as cigarettes, e-cigarettes, and chewing tobacco. If you need help quitting, ask your health care provider.  Do not use drugs.  If you are sexually active, practice safe sex. Use a condom or other form of protection to prevent STIs (sexually transmitted infections).  If you do not wish to become pregnant, use a form of birth control. If you plan to become pregnant, see your health care provider for a prepregnancy visit.  If told by your health care provider, take low-dose aspirin daily starting at age 20.  Find healthy ways to cope with stress, such as: ? Meditation, yoga, or listening to music. ? Journaling. ? Talking to a trusted person. ? Spending time with friends and family. Safety  Always wear your seat belt while driving or riding in a vehicle.  Do not drive: ? If you have been drinking alcohol. Do not ride with someone who has been drinking. ? When you are tired or distracted. ? While texting.  Wear a helmet and other protective equipment during sports activities.  If you have firearms in your house, make sure you follow all gun safety procedures. What's next?  Visit your health care provider  once a year for an annual wellness visit.  Ask your health care provider how often you should have your eyes and teeth checked.  Stay up to date on all vaccines. This information is not intended to replace advice given to you by your health care provider. Make sure you discuss any questions you have with your health care provider. Document Revised: 05/21/2020 Document Reviewed: 04/28/2018 Elsevier Patient Education  2021 [place].  Steps to Quit Smoking Smoking tobacco is the leading cause of preventable death. It can affect almost every organ in the body. Smoking puts you and those around you at risk for developing many serious chronic diseases. Quitting smoking can be difficult, but it is one of the best things that you can do for your health. It is never too late to quit. How do I get ready to quit? When you decide to quit smoking, create a plan to help you succeed. Before you quit:  Pick a date to quit. Set a date within the next 2 weeks to give you time to prepare.  Write down the reasons why you are quitting. Keep this list in places where you will see it often.  Tell your family, friends, and co-workers that you are quitting. Support from your loved ones can make quitting easier.  Talk with your health care provider about your options for quitting smoking.  Find out what treatment options are covered by your health insurance.  Identify people, places, things, and activities that make you want to smoke (triggers). Avoid them. What first steps can I take to quit smoking?  Throw away all cigarettes at home, at work, and in your car.  Throw away smoking accessories, such as Scientist, research (medical).  Clean your car. Make sure to empty the ashtray.  Clean your home, including curtains and carpets. What strategies can I use to quit smoking? Talk with your health care provider about combining strategies, such as taking medicines while you are also receiving in-person counseling.  Using these two strategies together makes you more likely to succeed in quitting  than if you used either strategy on its own.  If you are pregnant or breastfeeding, talk with your health care provider about finding counseling or other support strategies to quit smoking. Do not take medicine to help you quit smoking unless your health care provider tells you to do so. To quit smoking: Quit right away  Quit smoking completely, instead of gradually reducing how much you smoke over a period of time. Research shows that stopping smoking right away is more successful than gradually quitting.  Attend in-person counseling to help you build problem-solving skills. You are more likely to succeed in quitting if you attend counseling sessions regularly. Even short sessions of 10 minutes can be effective. Take medicine You may take medicines to help you quit smoking. Some medicines require a prescription and some you can purchase over-the-counter. Medicines may have nicotine in them to replace the nicotine in cigarettes. Medicines may:  Help to stop cravings.  Help to relieve withdrawal symptoms. Your health care provider may recommend:  Nicotine patches, gum, or lozenges.  Nicotine inhalers or sprays.  Non-nicotine medicine that is taken by mouth. Find resources Find resources and support systems that can help you to quit smoking and remain smoke-free after you quit. These resources are most helpful when you use them often. They include:  Online chats with a Social worker.  Telephone quitlines.  Printed Furniture conservator/restorer.  Support groups or group counseling.  Text messaging programs.  Mobile phone apps or applications. Use apps that can help you stick to your quit plan by providing reminders, tips, and encouragement. There are many free apps for mobile devices as well as websites. Examples include Quit Guide from the [employer] and smokefree.gov   What things can I do to make it easier to  quit?  Reach out to your family and friends for support and encouragement. Call telephone quitlines (1-800-QUIT-NOW), reach out to support groups, or work with a counselor for support.  Ask people who smoke to avoid smoking around you.  Avoid places that trigger you to smoke, such as bars, parties, or smoke-break areas at work.  Spend time with people who do not smoke.  Lessen the stress in your life. Stress can be a smoking trigger for some people. To lessen stress, try: ? Exercising regularly. ? Doing deep-breathing exercises. ? Doing yoga. ? Meditating. ? Performing a body scan. This involves closing your eyes, scanning your body from head to toe, and noticing which parts of your body are particularly tense. Try to relax the muscles in those areas.   How will I feel when I quit smoking? Day 1 to 3 weeks Within the first 24 hours of quitting smoking, you may start to feel withdrawal symptoms. These symptoms are usually most noticeable 2-3 days after quitting, but they usually do not last for more than 2-3 weeks. You may experience these symptoms:  Mood swings.  Restlessness, anxiety, or irritability.  Trouble concentrating.  Dizziness.  Strong cravings for sugary foods and nicotine.  Mild weight gain.  Constipation.  Nausea.  Coughing or a sore throat.  Changes in how the medicines that you take for unrelated issues work in your body.  Depression.  Trouble sleeping (insomnia). Week 3 and afterward After the first 2-3 weeks of quitting, you may start to notice more positive results, such as:  Improved sense of smell and taste.  Decreased coughing and sore throat.  Slower heart rate.  Lower blood pressure.  Clearer skin.  The ability to breathe more  easily.  Fewer sick days. Quitting smoking can be very challenging. Do not get discouraged if you are not successful the first time. Some people need to make many attempts to quit before they achieve long-term  success. Do your best to stick to your quit plan, and talk with your health care provider if you have any questions or concerns. Summary  Smoking tobacco is the leading cause of preventable death. Quitting smoking is one of the best things that you can do for your health.  When you decide to quit smoking, create a plan to help you succeed.  Quit smoking right away, not slowly over a period of time.  When you start quitting, seek help from your health care provider, family, or friends. This information is not intended to replace advice given to you by your health care provider. Make sure you discuss any questions you have with your health care provider. Document Revised: 05/12/2019 Document Reviewed: 11/05/2018 Elsevier Patient Education  [place].

## 2021-02-03 LAB — LIPID PANEL
Cholesterol: 248 mg/dL — ABNORMAL HIGH (ref 0–200)
HDL: 86.3 mg/dL (ref >39.00)
LDL Cholesterol: 152 mg/dL — ABNORMAL HIGH (ref 0–99)
NonHDL: 161.51
Total CHOL/HDL Ratio: 3
Triglycerides: 48 mg/dL (ref 0.0–149.0)
VLDL: 9.6 mg/dL (ref 0.0–40.0)

## 2021-02-24 HISTORY — PX: MEDIAL PARTIAL KNEE REPLACEMENT: SHX5965

## 2021-03-14 ENCOUNTER — Encounter: Payer: Self-pay | Admitting: Psychiatry

## 2021-03-14 ENCOUNTER — Ambulatory Visit (INDEPENDENT_AMBULATORY_CARE_PROVIDER_SITE_OTHER): Payer: BC Managed Care – PPO | Admitting: Psychiatry

## 2021-03-14 ENCOUNTER — Other Ambulatory Visit: Payer: Self-pay

## 2021-03-14 DIAGNOSIS — F3160 Bipolar disorder, current episode mixed, unspecified: Secondary | ICD-10-CM

## 2021-03-14 MED ORDER — LAMOTRIGINE ER 300 MG PO TB24: 300.0000 mg | ORAL_TABLET | Freq: Every day | ORAL | 1 refills | Status: DC

## 2021-03-14 MED ORDER — LATUDA 60 MG PO TABS: 60.0000 mg | ORAL_TABLET | Freq: Every evening | ORAL | 1 refills | Status: DC

## 2021-03-14 MED ORDER — OLANZAPINE 10 MG PO TABS: ORAL_TABLET | ORAL | 1 refills | Status: DC

## 2021-03-14 NOTE — Progress Notes (Signed)
[name] [id number] Apr 04, 1971 50 y.o.  Subjective:   Patient ID:  [name] is a 50 y.o. (DOB February 10, 1971) female.  Chief Complaint:  Chief Complaint  Patient presents with   Follow-up    Bipolar disorder, anxiety     HPI [name] presents to the office today for follow-up of bipolar disorder and anxiety. She had knee surgery on 02/20/21 and has been ambulating without assistance since a week after her surgery. She has started driving this past week. Mother staye for 3 weeks and step-mother also came to help. Completed pain medication.  She reports taking medication as directed and denies ever taking any of the pain medication.  Denies depressed mood. She reports that Olanzapine 20 mg is helpful for her anxiety. 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 She reports feeling "tired." Motivation has been ok. Sleeping well. Appetite has improved and is now normal. Adequate concentration. Denies impulsivity or risky behavior. Denies manic s/s. Denies SI.   She and her husband have been seeing Jessi Deussing for marital therapy.  She reports that she and her husband have been reconciling. Their  30 yo son is seeing Vivia Budge, [facility] for therapy.   Had mammogram this week. Sees endocrinologist 04/28/21. She reports that she lactates when she expresses milk. She reports that lactation has been continuous since the birth of her son who is now 93 yo. She breastfed until son was 3.5-4 yo. Stopped nursing June 2019.  She denies any increase in lactation following increased dose of Latuda.   Past Psychiatric Medication Trials: Lamictal- Has been helpful for mood. Reports that she has had fatigue with higher doses. Lithium- Wt gain Depakote Carbamazepine Topamax Seroquel Latuda- Has taken since 2010. She reports that this has been helpful for her mood. Has not been on  doses higher than 40 mg. Olanzapine- Has been helpful for insomnia. Denies side effects. Has taken since 2000 Trilafon Effexor XR Prozac Celexa Buspar Ambien    *Has never been on Abilify or [place] Office Visit from 08/14/2020 in [place] Visit from 04/04/2020 in [place] Visit from 01/25/2020 in [place] Visit from 12/25/2019 in [place] Total Score 0 0 0 [place] Visit from 01/30/2021 in [place]  Total GAD-7 Score [place] from 01/30/2021 in [place] Video Visit from 12/04/2020 in [place] Visit from 04/01/2020 in [place] Visit from 09/23/2017 in [place]  PHQ-2 Total Score 0 0 0 0  PHQ-9 Total Score 0 -- -- --        Review of Systems:  Review of Systems  Genitourinary:        Intermenstrual spotting  Musculoskeletal:  Negative for gait problem.  Neurological:  Negative for tremors.  Psychiatric/Behavioral:         Please refer to HPI    Medications: I have reviewed the patient's current medications.  Current Outpatient Medications  Medication Sig Dispense Refill   aspirin 325 MG tablet Take 325 mg by mouth 2 (two) times daily.     baclofen (LIORESAL) 10 MG tablet  Take 10 mg by mouth every 8 (eight) hours as needed.     celecoxib (CELEBREX) 200 MG capsule Take 200 mg by mouth daily.     nicotine (NICOTROL) 10 MG inhaler May use 5 daily for one month. 168 each 5   nicotine polacrilex (NICORETTE) 2 MG gum Take 1 each (2 mg total) by mouth as needed for smoking cessation. 100 tablet 0   LamoTRIgine (LAMICTAL XR) 300 MG TB24 24 hour tablet Take 1 tablet (300 mg total) by mouth daily. 90 tablet 1   Lurasidone HCl (LATUDA) 60 MG TABS Take 1 tablet  (60 mg total) by mouth every evening. 90 tablet 1   OLANZapine (ZYPREXA) 10 MG tablet Take 1.5-2 tabs po QHS 180 tablet 1   polyethylene glycol (MIRALAX / GLYCOLAX) 17 g packet Take 17 g by mouth daily.     Current Facility-Administered Medications  Medication Dose Route Frequency Provider Last Rate Last Admin   0.9 %  sodium chloride infusion  500 mL Intravenous Once Armbruster, Carlota Raspberry, MD        Medication Side Effects: Other: Possible lactation  Allergies: No Known Allergies  Past Medical History:  Diagnosis Date   Arthritis    osteoarthritis rt knee   Bipolar disorder ([place])    Gestational diabetes    gestational diabetes 2009   HSV (herpes simplex virus) anogenital infection    Vaginal Pap smear, abnormal     Past Medical History, Surgical history, Social history, and Family history were reviewed and updated as appropriate.   Please see review of systems for further details on the patient's review from today.   Objective:   Physical Exam:  There were no vitals taken for this visit.  Physical Exam Constitutional:      General: She is not in acute distress. Musculoskeletal:        General: No deformity.  Neurological:     Mental Status: She is alert and oriented to person, place, and time.     Coordination: Coordination normal.  Psychiatric:        Attention and Perception: Attention and perception normal. She does not perceive auditory or visual hallucinations.        Mood and Affect: Mood is anxious. Mood is not depressed. Affect is not labile, blunt, angry or inappropriate.        Speech: Speech normal.        Behavior: Behavior normal.        Thought Content: Thought content normal. Thought content is not paranoid or delusional. Thought content does not include homicidal or suicidal ideation. Thought content does not include homicidal or suicidal plan.        Cognition and Memory: Cognition and memory normal.        Judgment: Judgment normal.     Comments:  Insight intact Mood presents as less depressed and less anxious compared to last exam.    Lab Review:     Component Value Date/Time   NA 138 06/08/2019 1530   K 4.1 06/08/2019 1530   CL 103 06/08/2019 1530   CO2 25 06/08/2019 1530   GLUCOSE 81 06/08/2019 1530   BUN 14 06/08/2019 1530   CREATININE 1.12 06/08/2019 1530   CALCIUM 10.0 06/08/2019 1530   PROT 7.2 06/08/2019 1530   ALBUMIN 4.5 06/08/2019 1530   AST 14 06/08/2019 1530   ALT 12 06/08/2019 1530   ALKPHOS 27 (L) 06/08/2019 1530   BILITOT 0.4 06/08/2019 1530   GFRNONAA >60 05/05/2017  1125   GFRAA >60 05/05/2017 1125       Component Value Date/Time   WBC 7.5 06/08/2019 1530   RBC 3.86 06/08/2019 1530   HGB 11.9 06/08/2019 1530   HCT 34.7 (L) 06/08/2019 1530   PLT 308 06/08/2019 1530   MCV 89.9 06/08/2019 1530   MCH 30.8 06/08/2019 1530   MCHC 34.3 06/08/2019 1530   RDW 12.6 06/08/2019 1530   LYMPHSABS 3.1 12/14/2017 1447   MONOABS 0.7 12/14/2017 1447   EOSABS 0.1 12/14/2017 1447   BASOSABS 0.0 12/14/2017 1447    No results found for: POCLITH, LITHIUM   No results found for: PHENYTOIN, PHENOBARB, VALPROATE, CBMZ   .res Assessment: Plan:    Patient seen for 30 minutes and time spent counseling patient regarding hyperprolactinemia and discussed that atypical antipsychotics are associated with hyperprolactinemia and medications could be the cause for continued lactation.  Discussed that dose reduction in Latuda from 60 mg to 40 mg is unlikely to be beneficial since she reports that she was experiencing lactation prior to the increase to 60 mg daily.  She reports that this dose has been helpful for her mood and anxiety and prefers not to decrease dose at this time due to multiple psychosocial stressors and recent exacerbation in mood and anxiety. Discussed that addition of low dose partial dopamine agonists can be helpful for hyperprolactinemia, however she is already on 2 atypical antipsychotics.  Discussed treatment  approaches and patient reports that she would prefer to continue with work-up to rule out any potential medical causes for continued lactation and evaluation with endocrinologist.  Discussed that provider would be glad to coordinate care with endocrinologist if endocrinologist has recommendations regarding adjustments with her psychiatric medications.  Will schedule follow-up visit approximately 1 week after endocrinology evaluation to discuss recommendations and to evaluate treatment plan. Will continue Latuda 60 mg daily for treatment of mood and anxiety signs and symptoms. Continue olanzapine 15 to 20 mg at bedtime for mood and anxiety signs and symptoms. Continue lamotrigine XR 300 mg daily for mood stabilization. Recommend continued individual and couples therapy. Patient to follow-up in 5 to 6 weeks or sooner if clinically indicated. Patient advised to contact office with any questions, adverse effects, or acute worsening in signs and symptoms.    Ashya was seen today for follow-up.  Diagnoses and all orders for this visit:  Bipolar affective disorder, current episode mixed, current episode severity unspecified (HCC) -     Lurasidone HCl (LATUDA) 60 MG TABS; Take 1 tablet (60 mg total) by mouth every evening. -     LamoTRIgine (LAMICTAL XR) 300 MG TB24 24 hour tablet; Take 1 tablet (300 mg total) by mouth daily. -     OLANZapine (ZYPREXA) 10 MG tablet; Take 1.5-2 tabs po QHS    Please see After Visit Summary for patient specific instructions.  Future Appointments  Date Time Provider [place]  04/28/2021  1:00 PM Shamleffer, Melanie Crazier, MD [facility] None  05/12/2021  9:30 AM Thayer Headings, PMHNP [facility] None    No orders of the defined types were placed in this encounter.   -------------------------------

## 2021-04-28 ENCOUNTER — Ambulatory Visit: Payer: BC Managed Care – PPO | Admitting: Internal Medicine

## 2021-05-12 ENCOUNTER — Ambulatory Visit: Payer: BC Managed Care – PPO | Admitting: Psychiatry

## 2021-06-02 ENCOUNTER — Ambulatory Visit: Payer: BC Managed Care – PPO | Admitting: Psychiatry

## 2021-06-02 ENCOUNTER — Other Ambulatory Visit: Payer: Self-pay

## 2021-06-02 ENCOUNTER — Encounter: Payer: Self-pay | Admitting: Psychiatry

## 2021-06-02 VITALS — BP 127/79 | HR 72

## 2021-06-02 DIAGNOSIS — F3160 Bipolar disorder, current episode mixed, unspecified: Secondary | ICD-10-CM | POA: Diagnosis not present

## 2021-06-02 DIAGNOSIS — F419 Anxiety disorder, unspecified: Secondary | ICD-10-CM | POA: Diagnosis not present

## 2021-06-02 DIAGNOSIS — F17210 Nicotine dependence, cigarettes, uncomplicated: Secondary | ICD-10-CM | POA: Diagnosis not present

## 2021-06-02 MED ORDER — NICOTINE 10 MG IN INHA: RESPIRATORY_TRACT | 5 refills | Status: DC

## 2021-06-02 MED ORDER — HYDROXYZINE PAMOATE 25 MG PO CAPS: 25.0000 mg | ORAL_CAPSULE | Freq: Three times a day (TID) | ORAL | 0 refills | Status: DC | PRN

## 2021-06-02 MED ORDER — LAMOTRIGINE ER 300 MG PO TB24: 300.0000 mg | ORAL_TABLET | Freq: Every day | ORAL | 1 refills | Status: DC

## 2021-06-02 NOTE — Progress Notes (Signed)
[name] [id number] 11/02/1970 50 y.o.  Subjective:   Patient ID:  [name] is a 50 y.o. (DOB 16-Dec-1970) female.  Chief Complaint:  Chief Complaint  Patient presents with   Manic Behavior   Anxiety     HPI [name] presents to the office today for follow-up of mood disturbance and anxiety. "Just feel overwhelmed." Reports feeling "on edge." Reports some muscle and chest tightness with anxiety.   She is supposed to be the [employer] for [employer] [place]. She is also busy with work and on multiple committees. She reports having difficulty fulfilling work responsibilities with all the other responsibilities she has taken on.   Reports irritability. She reports elevated moods. She reports periods of increased energy- "busy, busy" and increased goal-directed activity. She reports that she is talking excessively at times. She reports excessive spending and "maxing out credit cards." Racing thoughts. Adequate sleep. Appetite is good. She reports that she has been eating "more junk." Reports that she has been counting calories. Energy has been labile. Notices some distractibility. Difficulty prioritizing. Denies SI.   She reports that she and her husband are "not getting along well."   Her 50 yo has missed school due to several illnesses.   She reports that manufacturer of Olanzapine changed and she is no longer able to split tabs. Took Olanzapine 15 mg po QHS for one week and noticed increase in manic s/s and resumed 20 mg a few nights ago. She reports that she has been using nicotine gum more and the nicotine inhaler less.   Continues to see Dr. Eulas Post individually weekly. Seeing couples therapist every other week.   Past Psychiatric Medication Trials: Lamictal- Has been helpful for mood. Reports that she has had fatigue with higher doses. Lithium- Wt gain Depakote Carbamazepine Topamax Seroquel Latuda- Has taken since 2010. She reports that this has been  helpful for her mood. Has not been on doses higher than 40 mg. Olanzapine- Has been helpful for insomnia. Denies side effects. Has taken since 2000 Trilafon Effexor XR Prozac Celexa Buspar Ambien  Hydroxyzine   *Has never been on Abilify or [place] Office Visit from 06/02/2021 in [place] Visit from 08/14/2020 in [place] Visit from 04/04/2020 in [place] Visit from 01/25/2020 in [place] Visit from 12/25/2019 in [place] Total Score 0 0 0 0 0      [place] Visit from 01/30/2021 in [place]  Total GAD-7 Score [place] Visit from 01/30/2021 in [place] Video Visit from 12/04/2020 in [place] Visit from 04/01/2020 in [place] Visit from 09/23/2017 in [place]  PHQ-2 Total Score 0 0 0 0  PHQ-9 Total Score 0 -- -- --        Review of Systems:  Review of Systems  Genitourinary:        Reports changes in her menstrual cycle and may be going through menopause. Some lactation and reports that this has decreased. Sees endocrinologist next month.   Musculoskeletal:  Negative for gait problem.  Neurological:  Negative for tremors.  Psychiatric/Behavioral:         Please refer to HPI   Medications: I have reviewed the patient's current medications.  Current  Outpatient Medications  Medication Sig Dispense Refill   aspirin 325 MG tablet Take 325 mg by mouth 2 (two) times daily.     celecoxib (CELEBREX) 200 MG capsule Take 200 mg by mouth daily.     hydrOXYzine (VISTARIL) 25 MG capsule Take 1 capsule (25 mg total) by mouth 3 (three) times daily as needed. 30 capsule 0   Lurasidone HCl (LATUDA) 60 MG TABS Take 1 tablet (60 mg total) by mouth every  evening. 90 tablet 1   OLANZapine (ZYPREXA) 10 MG tablet Take 1.5-2 tabs po QHS 180 tablet 1   polyethylene glycol (MIRALAX / GLYCOLAX) 17 g packet Take 17 g by mouth daily.     baclofen (LIORESAL) 10 MG tablet Take 10 mg by mouth every 8 (eight) hours as needed. (Patient not taking: Reported on 06/02/2021)     LamoTRIgine (LAMICTAL XR) 300 MG TB24 24 hour tablet Take 1 tablet (300 mg total) by mouth daily. 90 tablet 1   nicotine (NICOTROL) 10 MG inhaler May use 5 daily for one month. 168 each 5   nicotine polacrilex (NICORETTE) 2 MG gum Take 1 each (2 mg total) by mouth as needed for smoking cessation. (Patient not taking: Reported on 06/02/2021) 100 tablet 0   Current Facility-Administered Medications  Medication Dose Route Frequency Provider Last Rate Last Admin   0.9 %  sodium chloride infusion  500 mL Intravenous Once Armbruster, Carlota Raspberry, MD        Medication Side Effects: Other: Lactation  Allergies: No Known Allergies  Past Medical History:  Diagnosis Date   Arthritis    osteoarthritis rt knee   Bipolar disorder ([place])    Gestational diabetes    gestational diabetes 2009   HSV (herpes simplex virus) anogenital infection    Vaginal Pap smear, abnormal     Past Medical History, Surgical history, Social history, and Family history were reviewed and updated as appropriate.   Please see review of systems for further details on the patient's review from today.   Objective:   Physical Exam:  BP 127/79   Pulse 72   LMP 01/28/2021   Physical Exam Constitutional:      General: She is not in acute distress. Musculoskeletal:        General: No deformity.  Neurological:     Mental Status: She is alert and oriented to person, place, and time.     Coordination: Coordination normal.  Psychiatric:        Attention and Perception: Attention and perception normal. She does not perceive auditory or visual hallucinations.        Mood and Affect: Mood is anxious. Mood is not  depressed. Affect is labile. Affect is not blunt, angry or inappropriate.        Speech: Speech normal.        Behavior: Behavior normal.        Thought Content: Thought content normal. Thought content is not paranoid or delusional. Thought content does not include homicidal or suicidal ideation. Thought content does not include homicidal or suicidal plan.        Cognition and Memory: Cognition and memory normal.        Judgment: Judgment normal.     Comments: Insight intact    Lab Review:     Component Value Date/Time   NA 138 06/08/2019 1530   K 4.1 06/08/2019 1530   CL 103 06/08/2019 1530   CO2 25 06/08/2019 1530   GLUCOSE 81 06/08/2019 1530  BUN 14 06/08/2019 1530   CREATININE 1.12 06/08/2019 1530   CALCIUM 10.0 06/08/2019 1530   PROT 7.2 06/08/2019 1530   ALBUMIN 4.5 06/08/2019 1530   AST 14 06/08/2019 1530   ALT 12 06/08/2019 1530   ALKPHOS 27 (L) 06/08/2019 1530   BILITOT 0.4 06/08/2019 1530   GFRNONAA >60 05/05/2017 1125   GFRAA >60 05/05/2017 1125       Component Value Date/Time   WBC 7.5 06/08/2019 1530   RBC 3.86 06/08/2019 1530   HGB 11.9 06/08/2019 1530   HCT 34.7 (L) 06/08/2019 1530   PLT 308 06/08/2019 1530   MCV 89.9 06/08/2019 1530   MCH 30.8 06/08/2019 1530   MCHC 34.3 06/08/2019 1530   RDW 12.6 06/08/2019 1530   LYMPHSABS 3.1 12/14/2017 1447   MONOABS 0.7 12/14/2017 1447   EOSABS 0.1 12/14/2017 1447   BASOSABS 0.0 12/14/2017 1447    No results found for: POCLITH, LITHIUM   No results found for: PHENYTOIN, PHENOBARB, VALPROATE, CBMZ   .res Assessment: Plan:   Pt seen for 30 minutes and time spent discussing treatment plan. Agree to continue Olanzapine 20 mg po QHS for mood s/s since she experienced worsening manic s/s with 15 mg dose for one week.  She has apt with endocrinologist on 07/02/21 for evaluation of lactation. Discussed that lactation may likely be due to elevated prolactin levels from medication and if so, it may be beneficial to  switch Latuda to a partial dopamine agonist such as Abilify, Rexulti, or Vraylar. Offered to coordinate care with endocrinologist if needed.  Will continue Latuda 60 mg daily at this time to stabilize acute s/s. Will add Hydroxyzine 25 mg prn anxiety to help relieve anxiety and manic s/s. Reviewed potential benefits, risks, and side effects of Hydroxyzine.  Recommend continuing therapy and encouraged pt to work with therapist re: setting boundaries and attempting to reduce some of her responsibilities since this is causing her to feel overwhelmed.  Pt to follow-up in 4-5 weeks or sooner if clinically indicated.  Patient advised to contact office with any questions, adverse effects, or acute worsening in signs and symptoms.   Shavonta was seen today for manic behavior and anxiety.  Diagnoses and all orders for this visit:  Anxiety disorder, unspecified type -     hydrOXYzine (VISTARIL) 25 MG capsule; Take 1 capsule (25 mg total) by mouth 3 (three) times daily as needed.  Bipolar affective disorder, current episode mixed, current episode severity unspecified (HCC) -     LamoTRIgine (LAMICTAL XR) 300 MG TB24 24 hour tablet; Take 1 tablet (300 mg total) by mouth daily.  Cigarette nicotine dependence without complication -     nicotine (NICOTROL) 10 MG inhaler; May use 5 daily for one month.    Please see After Visit Summary for patient specific instructions.  Future Appointments  Date Time Provider [place]  07/02/2021  8:30 AM Shamleffer, Melanie Crazier, MD [facility] None  07/07/2021  9:30 AM Thayer Headings, PMHNP [facility] None    No orders of the defined types were placed in this encounter.   -------------------------------

## 2021-06-02 NOTE — Progress Notes (Signed)
   06/02/21 0945  Facial and Oral Movements  Muscles of Facial Expression 0  Lips and Perioral Area 0  Jaw 0  Tongue 0  Extremity Movements  Upper (arms, wrists, hands, fingers) 0  Lower (legs, knees, ankles, toes) 0  Trunk Movements  Neck, shoulders, hips 0  Overall Severity  Severity of abnormal movements (highest score from questions above) 0  Incapacitation due to abnormal movements 0  Patient's awareness of abnormal movements (rate only patient's report) 0  AIMS Total Score  AIMS Total Score 0

## 2021-07-02 ENCOUNTER — Other Ambulatory Visit: Payer: Self-pay

## 2021-07-02 ENCOUNTER — Ambulatory Visit (INDEPENDENT_AMBULATORY_CARE_PROVIDER_SITE_OTHER): Payer: BC Managed Care – PPO | Admitting: Internal Medicine

## 2021-07-02 ENCOUNTER — Encounter: Payer: Self-pay | Admitting: Internal Medicine

## 2021-07-02 VITALS — BP 110/70 | HR 70 | Ht 62.0 in | Wt 137.0 lb

## 2021-07-02 DIAGNOSIS — Z23 Encounter for immunization: Secondary | ICD-10-CM

## 2021-07-02 DIAGNOSIS — E221 Hyperprolactinemia: Secondary | ICD-10-CM | POA: Diagnosis not present

## 2021-07-02 LAB — TSH: TSH: 2.38 u[IU]/mL (ref 0.35–5.50)

## 2021-07-02 LAB — FOLLICLE STIMULATING HORMONE: FSH: 4.6 m[IU]/mL

## 2021-07-02 LAB — LUTEINIZING HORMONE: LH: 1.15 m[IU]/mL

## 2021-07-02 LAB — T4, FREE: Free T4: 0.71 ng/dL (ref 0.60–1.60)

## 2021-07-02 LAB — CORTISOL: Cortisol, Plasma: 8.6 ug/dL

## 2021-07-02 NOTE — Progress Notes (Signed)
Name: [name]  MRN/ DOB: [id number], 1971/08/22    Age/ Sex: 50 y.o., female    PCP: Libby Maw, MD   Reason for Endocrinology Evaluation: Hyperprolactinemia      Date of Initial Endocrinology Evaluation: 07/02/2021     HPI: [name] is a 50 y.o. female with a past medical history of Bipolar d/o . The patient presented for initial endocrinology clinic visit on 07/02/2021 for consultative assistance with her Hyperprolactinemia .   She has bee noted with elevated prolactin level in 12/2020 during lab work up at [place] office at 53.6 ng/dL (4.8-23.3) for evaluation of galactorrhea .  She is S/P delivery of a baby boy in 2015, she nursed him until the age of 46 in 2019. Since then she has continued with galactorrhea    LMP 01/28/2021 , has noted spotting since then  Denies headaches or vision changes  Denies weight changes   She is not on Zantac or Pepcid Denies narcotic use  No Marijuana use Denies constipation or diarrhea  Denies hot flashes  No change in ring or shoe size  Denies excessive sweating  Up to date on Mammograms      HISTORY:  Past Medical History:  Past Medical History:  Diagnosis Date   Arthritis    osteoarthritis rt knee   Bipolar disorder ([place])    Gestational diabetes    gestational diabetes 2009   HSV (herpes simplex virus) anogenital infection    Vaginal Pap smear, abnormal    Past Surgical History:  Past Surgical History:  Procedure Laterality Date   ANKLE RECONSTRUCTION Bilateral    CESAREAN SECTION     CESAREAN SECTION N/A 06/28/2014   Procedure: CESAREAN SECTION;  Surgeon: Olga Millers, MD;  Location: [facility];  Service: Obstetrics;  Laterality: N/A;   CESAREAN SECTION     LEEP      Social History:  reports that she quit smoking about 2 years ago. She smoked an average of .5 packs per day. She has never used smokeless tobacco. She reports that she does not drink alcohol and does not use drugs. Family History:  family history includes ADD / ADHD in her son; Alcohol abuse in her father; Bipolar disorder in her mother; Colon polyps (age of onset: 59) in her father; Depression in her mother; Diabetes in her father; Diabetes Mellitus II in her father; Heart disease in her maternal grandfather; Hypertension in her maternal grandfather; Irritable bowel syndrome in her mother; ODD in her son; Pancreatic cancer in her maternal grandmother; Personality disorder in her mother.   HOME MEDICATIONS: Allergies as of 07/02/2021   No Known Allergies      Medication List        Accurate as of July 02, 2021  7:18 AM. If you have any questions, ask your nurse or doctor.          aspirin 325 MG tablet Take 325 mg by mouth 2 (two) times daily.   baclofen 10 MG tablet Commonly known as: LIORESAL Take 10 mg by mouth every 8 (eight) hours as needed.   celecoxib 200 MG capsule Commonly known as: CELEBREX Take 200 mg by mouth daily.   hydrOXYzine 25 MG capsule Commonly known as: VISTARIL Take 1 capsule (25 mg total) by mouth 3 (three) times daily as needed.   LamoTRIgine 300 MG Tb24 24 hour tablet Commonly known as: LaMICtal XR Take 1 tablet (300 mg total) by mouth daily.   Anette Guarneri  60 MG Tabs Generic drug: Lurasidone HCl Take 1 tablet (60 mg total) by mouth every evening.   nicotine 10 MG inhaler Commonly known as: Nicotrol May use 5 daily for one month.   nicotine polacrilex 2 MG gum Commonly known as: Nicorette Take 1 each (2 mg total) by mouth as needed for smoking cessation.   OLANZapine 10 MG tablet Commonly known as: ZYPREXA Take 1.5-2 tabs po QHS   polyethylene glycol 17 g packet Commonly known as: MIRALAX / GLYCOLAX Take 17 g by mouth daily.          REVIEW OF SYSTEMS: A comprehensive ROS was conducted with the patient and is negative except as per HPI     OBJECTIVE:  VS: BP 110/70 (BP Location: Left Arm, Patient Position: Sitting, Cuff Size: Small)   Pulse 70   Ht 5'  2" (1.575 m)   Wt 137 lb (62.1 kg)   SpO2 97%   BMI 25.06 kg/m    Wt Readings from Last 3 Encounters:  01/30/21 129 lb (58.5 kg)  12/04/20 140 lb (63.5 kg)  11/27/20 140 lb (63.5 kg)     EXAM: General: Pt appears well and is in NAD  Eyes: External eye exam normal without stare, lid lag or exophthalmos.  EOM intact.   Neck: General: Supple without adenopathy. Thyroid: Thyroid size normal.  No goiter or nodules appreciated.   Lungs: Clear with good BS bilat with no rales, rhonchi, or wheezes  Breast:  Bilateral breast exam did not show any evidence of lumps. Bilateral nipple milky discharge noted with expression   Heart: Auscultation: RRR.  Abdomen: Normoactive bowel sounds, soft, nontender, without masses or organomegaly palpable  Extremities:  BL LE: No pretibial edema normal ROM and strength.  Mental Status: Judgment, insight: Intact Memory: Intact for recent and remote events Mood and affect: No depression, anxiety, or agitation     DATA REVIEWED: Results for SAMANVI, CUCCIA (MRN [id number]) as of 07/04/2021 08:58  Ref. Range 07/02/2021 08:52  Cortisol, Plasma Latest Units: ug/dL 8.6  LH Latest Units: mIU/mL 1.15  FSH Latest Units: mIU/ML 4.6  Prolactin Latest Units: ng/mL 25.0  Estradiol Latest Units: pg/mL 75  TSH Latest Ref Range: 0.35 - 5.50 uIU/mL 2.38  T4,Free(Direct) Latest Ref Range: 0.60 - 1.60 ng/dL 0.71      ASSESSMENT/PLAN/RECOMMENDATIONS:   Hyperprolactinemia :   - Resolved - Reassurance provided  - Res of pituitary hormones normal     2. Idiopathic Galactorrhea :   - Prolactin normal  - This is not distressing , hence no treatment necessary at this time  - I have asked her to avoid expressing milk at all cost as that stimulates more milk production     Addendum: Discussed labs and recommendations with the pt on 07/04/2021 at 9 AM    Signed electronically by: Mack Guise, MD  [facility]  [place]  [address] [place], [place] [address] Phone: [phone] FAX: [phone]   CC: Libby Maw, [place] [address] Phone: [phone] Fax: [phone]   Return to Endocrinology clinic as below: Future Appointments  Date Time Provider [place]  07/02/2021  8:30 AM Laiah Pouncey, Melanie Crazier, MD [facility] None  07/07/2021  9:30 AM Thayer Headings, PMHNP [facility] None

## 2021-07-04 ENCOUNTER — Telehealth: Payer: BC Managed Care – PPO | Admitting: Physician Assistant

## 2021-07-04 DIAGNOSIS — Z716 Tobacco abuse counseling: Secondary | ICD-10-CM

## 2021-07-04 MED ORDER — NICOTINE 21 MG/24HR TD PT24: 21.0000 mg | MEDICATED_PATCH | Freq: Every day | TRANSDERMAL | 0 refills | Status: DC

## 2021-07-04 NOTE — Progress Notes (Signed)
I have spent 5 minutes in review of e-visit questionnaire, review and updating patient chart, medical decision making and response to patient.   Bertie Simien Cody Cha Gomillion, PA-C    

## 2021-07-04 NOTE — Progress Notes (Addendum)
E-Visit for Smoking Cessation  Congratulations for your interest in quitting smoking!  Quitting smoking is one of the most important things you can do to protect your health.  We are here to help you! Did you know that if you quit smoking 1 pack per day you could save up to $2550.00 per year?  Medications are not appropriate for everyone depending upon your situation and any health issues you may have. If we do prescribe medication, it will be for 1 month at time and you will be required to have a follow up E-Visit at 1 month to assess how you are doing and any side effects.  This could be up to a total of 3 months.    Support of your friends, family and work colleagues is very important. Please let them know that you are trying to stop smoking so that they understand your need for support in this goal.  Remove tobacco products from your environment  Set a quit date ideally within 2 weeks.  You should totally abstain from smoking after your quit date. A single puff could hurt your progress or cause you to relapse  If there are others in your household that smoke, ask them to try to quit or abstain from smoking in your presence  You may notice nicotine withdrawal symptoms such as increased appetite and weight gain, changes in mood, insomnia, irritability and/or anxiety. These symptoms peak in the first three days after smoking cessation and subside over the next 3-4 weeks.   Continue Nicotrol as directed by your PCP.   A combination of behavioral and medication can improve the success of you quitting smoking. You may want to use the 1-800-QUIT-NOW free support line.  Also, the Department of Health and Human Services provides Smoke free apps for smartphones: https://www.white.net/  If you happen to break your plan and have a cigarette, keep taking your medications and continue to try to abstain.  Do not give up!  MAKE SURE YOU  Take any prescribed medications only as instructed.   If you miss a dose of medication, take the next dose when it is due and get back on schedule. DO NOT double up on medications.  Mark your calendar to do your Follow Up Smoking Cessation E-Visit in one month    Thank you for choosing an e-visit.  Your e-visit answers were reviewed by a board certified advanced clinical practitioner to complete your personal care plan. Depending upon the condition, your plan could have included both over the counter or prescription medications.  Please review your pharmacy choice. Make sure the pharmacy is open so you can pick up prescription now. If there is a problem, you may contact your provider through [employer] and have the prescription routed to another pharmacy.  Your safety is important to [place]. If you have drug allergies check your prescription carefully.   For the next 24 hours you can use MyChart to ask questions about today's visit, request a non-urgent call back, or ask for a work or school excuse. You will get an email in the next two days asking about your experience. I hope that your e-visit has been valuable and will speed your recovery.

## 2021-07-07 ENCOUNTER — Other Ambulatory Visit: Payer: Self-pay

## 2021-07-07 ENCOUNTER — Encounter: Payer: Self-pay | Admitting: Psychiatry

## 2021-07-07 ENCOUNTER — Ambulatory Visit: Payer: BC Managed Care – PPO | Admitting: Psychiatry

## 2021-07-07 DIAGNOSIS — F3160 Bipolar disorder, current episode mixed, unspecified: Secondary | ICD-10-CM | POA: Diagnosis not present

## 2021-07-07 DIAGNOSIS — F17210 Nicotine dependence, cigarettes, uncomplicated: Secondary | ICD-10-CM

## 2021-07-07 LAB — ESTRADIOL: Estradiol: 75 pg/mL

## 2021-07-07 LAB — ACTH: C206 ACTH: 18 pg/mL (ref 6–50)

## 2021-07-07 LAB — INSULIN-LIKE GROWTH FACTOR
IGF-I, LC/MS: 151 ng/mL (ref 52–328)
Z-Score (Female): 0.1 SD (ref ?–2.0)

## 2021-07-07 LAB — PROLACTIN: Prolactin: 25 ng/mL

## 2021-07-07 MED ORDER — NICOTINE 10 MG IN INHA: RESPIRATORY_TRACT | 1 refills | Status: DC

## 2021-07-07 MED ORDER — LAMOTRIGINE ER 300 MG PO TB24: 300.0000 mg | ORAL_TABLET | Freq: Every day | ORAL | 1 refills | Status: DC

## 2021-07-07 MED ORDER — LATUDA 60 MG PO TABS: 60.0000 mg | ORAL_TABLET | Freq: Every evening | ORAL | 1 refills | Status: DC

## 2021-07-07 MED ORDER — OLANZAPINE 10 MG PO TABS: ORAL_TABLET | ORAL | 1 refills | Status: DC

## 2021-07-07 NOTE — Progress Notes (Signed)
[name] [id number] 07-29-1971 50 y.o.  Subjective:   Patient ID:  [name] is a 50 y.o. (DOB 08-10-1971) female.  Chief Complaint:  Chief Complaint  Patient presents with   Follow-up    Bipolar D/O and anxiety     HPI [name] presents to the office today for follow-up of anxiety and Bipolar D/O. She reports that Prolactin levels are now WNL. She was seen by endocrinologist and no medication changes were recommended.   The patient was counseled on tobacco cessation today for 15 minutes.  Counseling included reviewing the risks of smoking tobacco products, how it impacts the patient's current medical diagnoses and different strategies for quitting.  Pharmacotherapy to aid in tobacco cessation was prescribed today.   She reports that she has not been taking Hydroxyzine since it seemed to increase hunger. She reports that she has been busy with multiple commitments.   She notices some OCD s/s with excessive list making and wanting things done in a certain way. Notices rumination.   She reports racing thoughts. She reports that she is "tired but I have to keep going." She reports "bouts of depression... dread." Occ low motivation- "doing it because I have to do it." Denies elevated mood. Sleeping 7-8 hours a night. Appetite is ok. Notices some racing thoughts. Concentration is ok and frequently multi-tasking. Denies impulsive or risky behavior. Denies SI.   Her 88 yo son has severe poison ivy/poison oak. Her 48 yo has been active in band. Her 48 yo is doing well. She reports that she and her husband are doing ok.   Continues to see couples therapist and individual therapist.   Past Psychiatric Medication Trials: Lamictal- Has been helpful for mood. Reports that she has had fatigue with higher doses. Lithium- Wt gain Depakote Carbamazepine Topamax Seroquel Latuda- Has taken since 2010. She reports that this has been helpful for her mood. Has not been on doses higher  than 40 mg. Olanzapine- Has been helpful for insomnia. Denies side effects. Has taken since 2000 Trilafon Effexor XR Prozac Celexa Buspar Ambien  Hydroxyzine   *Has never been on Abilify or [place] Office Visit from 06/02/2021 in [place] Visit from 08/14/2020 in [facility] Visit from 04/04/2020 in [place] Visit from 01/25/2020 in [facility] Visit from 12/25/2019 in [facility] Total Score 0 0 0 0 0      [place] Visit from 01/30/2021 in [place]  Total GAD-7 Score [place] from 01/30/2021 in [facility] Video Visit from 12/04/2020 in [facility] Visit from 04/01/2020 in [place] Visit from 09/23/2017 in [facility]  PHQ-2 Total Score 0 0 0 0  PHQ-9 Total Score 0 -- -- --        Review of Systems:  Review of Systems  Musculoskeletal:  Negative for gait problem.  Neurological:  Negative for tremors.  Psychiatric/Behavioral:         Please refer to HPI   Medications: I have reviewed the patient's current medications.  Current Outpatient Medications  Medication Sig Dispense Refill   aspirin 325 MG tablet Take 325 mg by mouth 2 (two) times daily.     celecoxib (CELEBREX) 200 MG capsule Take 200 mg  by mouth daily.     hydrOXYzine (VISTARIL) 25 MG capsule Take 1 capsule (25 mg total) by mouth 3 (three) times daily as needed. (Patient not taking: Reported on 07/07/2021) 30 capsule 0   LamoTRIgine (LAMICTAL XR) 300 MG TB24 24 hour tablet Take 1 tablet (300 mg total) by mouth daily. 90 tablet 1   Lurasidone HCl (LATUDA) 60 MG TABS Take 1 tablet (60 mg total) by mouth every evening. 90 tablet 1   nicotine (NICOTROL) 10 MG inhaler INHALE 1 CARTRIDGE (1  CONTINUOUS PUFFING TOTAL) INTO THE LUNGS AS NEEDED FOR SMOKING CESSATION. 168 each 1   OLANZapine (ZYPREXA) 10 MG tablet Take 1.5-2 tabs po QHS 180 tablet 1   polyethylene glycol (MIRALAX / GLYCOLAX) 17 g packet Take 17 g by mouth daily.     Current Facility-Administered Medications  Medication Dose Route Frequency Provider Last Rate Last Admin   0.9 %  sodium chloride infusion  500 mL Intravenous Once Armbruster, Carlota Raspberry, MD        Medication Side Effects: None  Allergies: No Known Allergies  Past Medical History:  Diagnosis Date   Arthritis    osteoarthritis rt knee   Bipolar disorder ([place])    Gestational diabetes    gestational diabetes 2009   HSV (herpes simplex virus) anogenital infection    Vaginal Pap smear, abnormal     Past Medical History, Surgical history, Social history, and Family history were reviewed and updated as appropriate.   Please see review of systems for further details on the patient's review from today.   Objective:   Physical Exam:  There were no vitals taken for this visit.  Physical Exam Constitutional:      General: She is not in acute distress. Musculoskeletal:        General: No deformity.  Neurological:     Mental Status: She is alert and oriented to person, place, and time.     Coordination: Coordination normal.  Psychiatric:        Attention and Perception: Attention and perception normal. She does not perceive auditory or visual hallucinations.        Mood and Affect: Mood is anxious. Mood is not depressed. Affect is not labile, blunt, angry or inappropriate.        Speech: Speech normal.        Behavior: Behavior normal.        Thought Content: Thought content normal. Thought content is not paranoid or delusional. Thought content does not include homicidal or suicidal ideation. Thought content does not include homicidal or suicidal plan.        Cognition and Memory: Cognition and memory normal.        Judgment: Judgment normal.      Comments: Insight intact    Lab Review:     Component Value Date/Time   NA 138 06/08/2019 1530   K 4.1 06/08/2019 1530   CL 103 06/08/2019 1530   CO2 25 06/08/2019 1530   GLUCOSE 81 06/08/2019 1530   BUN 14 06/08/2019 1530   CREATININE 1.12 06/08/2019 1530   CALCIUM 10.0 06/08/2019 1530   PROT 7.2 06/08/2019 1530   ALBUMIN 4.5 06/08/2019 1530   AST 14 06/08/2019 1530   ALT 12 06/08/2019 1530   ALKPHOS 27 (L) 06/08/2019 1530   BILITOT 0.4 06/08/2019 1530   GFRNONAA >60 05/05/2017 1125   GFRAA >60 05/05/2017 1125       Component Value Date/Time   WBC 7.5 06/08/2019 1530  RBC 3.86 06/08/2019 1530   HGB 11.9 06/08/2019 1530   HCT 34.7 (L) 06/08/2019 1530   PLT 308 06/08/2019 1530   MCV 89.9 06/08/2019 1530   MCH 30.8 06/08/2019 1530   MCHC 34.3 06/08/2019 1530   RDW 12.6 06/08/2019 1530   LYMPHSABS 3.1 12/14/2017 1447   MONOABS 0.7 12/14/2017 1447   EOSABS 0.1 12/14/2017 1447   BASOSABS 0.0 12/14/2017 1447    No results found for: POCLITH, LITHIUM   No results found for: PHENYTOIN, PHENOBARB, VALPROATE, CBMZ   .res Assessment: Plan:   Pt seen for 30 minutes and time spent reviewing labs, endocrinology evaluation, and discussing smoking cessation. She would like to continue Nicotrol inhaler to help with smoking cessation. Script sent to pharmacy.  Continue Lamictal XR 300 mg po qd for mood s/s.  Continue Latuda 60 mg po qd with food for mood s/s.  Continue Olanzapine 15-20 mg po QHS for mood s/s.  Recommend continuing individual and marital therapy.  Pt to follow-up with this provider in 2 months or sooner if clinically indicated.  Patient advised to contact office with any questions, adverse effects, or acute worsening in signs and symptoms.  [name] was seen today for follow-up.  Diagnoses and all orders for this visit:  Cigarette nicotine dependence without complication -     nicotine (NICOTROL) 10 MG inhaler; INHALE 1 CARTRIDGE (1 CONTINUOUS PUFFING TOTAL)  INTO THE LUNGS AS NEEDED FOR SMOKING CESSATION.  Bipolar affective disorder, current episode mixed, current episode severity unspecified (HCC) -     LamoTRIgine (LAMICTAL XR) 300 MG TB24 24 hour tablet; Take 1 tablet (300 mg total) by mouth daily. -     Lurasidone HCl (LATUDA) 60 MG TABS; Take 1 tablet (60 mg total) by mouth every evening. -     OLANZapine (ZYPREXA) 10 MG tablet; Take 1.5-2 tabs po QHS    Please see After Visit Summary for patient specific instructions.  Future Appointments  Date Time Provider [place]  09/15/2021  9:00 AM Thayer Headings, PMHNP [facility] None  10/30/2021  8:50 AM Shamleffer, Melanie Crazier, MD [facility] None    No orders of the defined types were placed in this encounter.   -------------------------------

## 2021-07-07 NOTE — Patient Instructions (Signed)
The patient was counseled on tobacco cessation today for 15 minutes.  Counseling included reviewing the risks of smoking tobacco products, how it impacts the patient's current medical diagnoses and different strategies for quitting.  Pharmacotherapy to aid in tobacco cessation was prescribed today.

## 2021-09-15 ENCOUNTER — Ambulatory Visit: Payer: BC Managed Care – PPO | Admitting: Psychiatry

## 2021-10-17 ENCOUNTER — Encounter: Payer: Self-pay | Admitting: Internal Medicine

## 2021-10-17 ENCOUNTER — Other Ambulatory Visit: Payer: Self-pay

## 2021-10-17 ENCOUNTER — Ambulatory Visit: Payer: BC Managed Care – PPO | Admitting: Internal Medicine

## 2021-10-17 ENCOUNTER — Telehealth: Payer: Self-pay

## 2021-10-17 VITALS — BP 102/66 | HR 64 | Ht 62.0 in | Wt 139.0 lb

## 2021-10-17 DIAGNOSIS — N643 Galactorrhea not associated with childbirth: Secondary | ICD-10-CM | POA: Diagnosis not present

## 2021-10-17 DIAGNOSIS — E221 Hyperprolactinemia: Secondary | ICD-10-CM | POA: Diagnosis not present

## 2021-10-17 DIAGNOSIS — R519 Headache, unspecified: Secondary | ICD-10-CM

## 2021-10-17 DIAGNOSIS — N943 Premenstrual tension syndrome: Secondary | ICD-10-CM | POA: Diagnosis not present

## 2021-10-17 LAB — PROLACTIN: Prolactin: 11.7 ng/mL

## 2021-10-17 LAB — BASIC METABOLIC PANEL
BUN: 13 mg/dL (ref 6–23)
CO2: 28 mEq/L (ref 19–32)
Calcium: 10 mg/dL (ref 8.4–10.5)
Chloride: 105 mEq/L (ref 96–112)
Creatinine, Ser: 1.2 mg/dL (ref 0.40–1.20)
GFR: 53.45 mL/min — ABNORMAL LOW (ref >60.00)
Glucose, Bld: 91 mg/dL (ref 70–99)
Potassium: 4.5 mEq/L (ref 3.5–5.1)
Sodium: 141 mEq/L (ref 135–145)

## 2021-10-17 LAB — TSH: TSH: 1.58 u[IU]/mL (ref 0.35–5.50)

## 2021-10-17 LAB — FOLLICLE STIMULATING HORMONE: FSH: 11.4 m[IU]/mL

## 2021-10-17 LAB — T4, FREE: Free T4: 0.86 ng/dL (ref 0.60–1.60)

## 2021-10-17 NOTE — Telephone Encounter (Signed)
Patient appointment moved up to today

## 2021-10-17 NOTE — Progress Notes (Signed)
Name: [name]  MRN/ DOB: [id number], [birth year]    Age/ Sex: 51 y.o., female    PCP: Libby Maw, MD   Reason for Endocrinology Evaluation: Hyperprolactinemia      Date of Initial Endocrinology Evaluation: 10/17/2021     HPI: [name] is a 51 y.o. female with a past medical history of Bipolar d/o . The patient presented for initial endocrinology clinic visit on 10/17/2021 for consultative assistance with her Hyperprolactinemia .   She has been noted with elevated prolactin level in 12/2020 during lab work up at [place] office at 53.6 ng/dL (4.8-23.3) for evaluation of galactorrhea .  She is S/P delivery of a baby boy in 2015, she nursed him until the age of 30 in 2019. Since then she has continued with galactorrhea    She is not on Zantac or Pepcid Denies narcotic use  No Marijuana use    Prolactin TFT's, IGF-1 , ACTH and cortisol have all come back normal.   SUBJECTIVE:     Today (10/17/21):  [name] is here for a follow up on Galactorrhea.  Two weeks ago noted right groin pain and mastalgia Feels a tennis ball in between her eyes and head . Denies nasal drip or congestion  Eyes feel dry and stingy   Continues with galactorrhea    LMP 10/16/2021 Scheduled with Gyn next month      HISTORY:  Past Medical History:  Past Medical History:  Diagnosis Date   Arthritis    osteoarthritis rt knee   Bipolar disorder ([place])    Gestational diabetes    gestational diabetes 2009   HSV (herpes simplex virus) anogenital infection    Vaginal Pap smear, abnormal    Past Surgical History:  Past Surgical History:  Procedure Laterality Date   ANKLE RECONSTRUCTION Bilateral    CESAREAN SECTION     CESAREAN SECTION N/A 06/28/2014   Procedure: CESAREAN SECTION;  Surgeon: Olga Millers, MD;  Location: [facility];  Service: Obstetrics;  Laterality: N/A;   CESAREAN SECTION     LEEP      Social History:  reports that she quit smoking about 2 years ago.  Her smoking use included cigarettes. She smoked an average of .5 packs per day. She has never used smokeless tobacco. She reports that she does not drink alcohol and does not use drugs. Family History: family history includes ADD / ADHD in her son; Alcohol abuse in her father; Bipolar disorder in her mother; Colon polyps (age of onset: 60) in her father; Depression in her mother; Diabetes in her father; Diabetes Mellitus II in her father; Heart disease in her maternal grandfather; Hypertension in her maternal grandfather; Irritable bowel syndrome in her mother; ODD in her son; Pancreatic cancer in her maternal grandmother; Personality disorder in her mother.   HOME MEDICATIONS: Allergies as of 10/17/2021   No Known Allergies      Medication List        Accurate as of October 17, 2021 11:27 AM. If you have any questions, ask your nurse or doctor.          STOP taking these medications    nicotine 10 MG inhaler Commonly known as: Nicotrol Stopped by: Dorita Sciara, MD       TAKE these medications    aspirin 325 MG tablet Take 325 mg by mouth 2 (two) times daily.   celecoxib 200 MG capsule Commonly known as: CELEBREX Take 200 mg by  mouth daily.   hydrOXYzine 25 MG capsule Commonly known as: VISTARIL Take 1 capsule (25 mg total) by mouth 3 (three) times daily as needed.   LamoTRIgine 300 MG Tb24 24 hour tablet Commonly known as: LaMICtal XR Take 1 tablet (300 mg total) by mouth daily.   Latuda 60 MG Tabs Generic drug: Lurasidone HCl Take 1 tablet (60 mg total) by mouth every evening.   OLANZapine 10 MG tablet Commonly known as: ZYPREXA Take 1.5-2 tabs po QHS   polyethylene glycol 17 g packet Commonly known as: MIRALAX / GLYCOLAX Take 17 g by mouth daily.          REVIEW OF SYSTEMS: A comprehensive ROS was conducted with the patient and is negative except as per HPI     OBJECTIVE:  VS: BP 102/66 (BP Location: Left Arm, Patient Position: Sitting,  Cuff Size: Small)    Pulse 64    Ht 5\' 2"  (1.575 m)    Wt 139 lb (63 kg)    SpO2 97%    BMI 25.42 kg/m    Wt Readings from Last 3 Encounters:  10/17/21 139 lb (63 kg)  07/02/21 137 lb (62.1 kg)  01/30/21 129 lb (58.5 kg)     EXAM: General: Pt appears well and is in NAD  Eyes: External eye exam normal without stare, lid lag or exophthalmos.  EOM intact.   Neck: General: Supple without adenopathy. Thyroid: Thyroid size normal.  No goiter or nodules appreciated.   Lungs: Clear with good BS bilat with no rales, rhonchi, or wheezes  Breast:  Bilateral breast exam did not show any evidence of lumps. Bilateral nipple milky discharge noted with expression   Heart: Auscultation: RRR.  Abdomen: Normoactive bowel sounds, soft, nontender, without masses or organomegaly palpable  Extremities:  BL LE: No pretibial edema normal ROM and strength.  Mental Status: Judgment, insight: Intact Memory: Intact for recent and remote events Mood and affect: No depression, anxiety, or agitation     DATA REVIEWED: Results for DELLIA, DONNELLY (MRN [id number]) as of 07/04/2021 08:58  Ref. Range 07/02/2021 08:52  Cortisol, Plasma Latest Units: ug/dL 8.6  LH Latest Units: mIU/mL 1.15  FSH Latest Units: mIU/ML 4.6  Prolactin Latest Units: ng/mL 25.0  Estradiol Latest Units: pg/mL 75  TSH Latest Ref Range: 0.35 - 5.50 uIU/mL 2.38  T4,Free(Direct) Latest Ref Range: 0.60 - 1.60 ng/dL 0.71      ASSESSMENT/PLAN/RECOMMENDATIONS:   Headaches:   -Her main complaint today seems to be on new onset headaches as well as premenstrual symptoms.  Most of the premenstrual symptoms have resolved with resumption of menstruation -I do believe that she is concerned about pituitary gland, we will try to proceed with CT scan of the head   2. Idiopathic Galactorrhea :   - Prolactin remains normal normal  - This is not distressing , hence no treatment necessary at this time  - I have asked her to avoid expressing milk  at all cost as that stimulates more milk production  -She was again offered cabergoline if she chooses to  3.  Premenstrual syndrome:  -Her symptoms of mastalgia, and headache seem to be consistent with premenstrual symptoms. -She has an upcoming appointment with GYN and will discuss this with them   Follow-up as needed Signed electronically by: Mack Guise, MD  [facility]  [facility] [address] [place], [place] [address] Phone: [phone] FAX: [phone]   CC: Libby Maw, MD  [place] [address] Phone: [phone] Fax: [phone]   Return to Endocrinology clinic as below: Future Appointments  Date Time Provider [place]  10/17/2021 11:30 AM Annaclaire Walsworth, Melanie Crazier, MD [facility] None  10/21/2021  9:00 AM Thayer Headings, PMHNP [facility] None  02/04/2022  9:00 AM Libby Maw, MD [facility]

## 2021-10-20 DIAGNOSIS — R519 Headache, unspecified: Secondary | ICD-10-CM | POA: Insufficient documentation

## 2021-10-20 DIAGNOSIS — N643 Galactorrhea not associated with childbirth: Secondary | ICD-10-CM | POA: Insufficient documentation

## 2021-10-21 ENCOUNTER — Ambulatory Visit: Payer: Self-pay | Admitting: Psychiatry

## 2021-10-24 ENCOUNTER — Ambulatory Visit: Payer: BC Managed Care – PPO | Admitting: Internal Medicine

## 2021-10-30 ENCOUNTER — Ambulatory Visit: Payer: BC Managed Care – PPO | Admitting: Internal Medicine

## 2021-11-13 ENCOUNTER — Encounter: Payer: Self-pay | Admitting: Psychiatry

## 2021-11-13 ENCOUNTER — Other Ambulatory Visit: Payer: Self-pay

## 2021-11-13 ENCOUNTER — Ambulatory Visit (INDEPENDENT_AMBULATORY_CARE_PROVIDER_SITE_OTHER): Payer: BC Managed Care – PPO | Admitting: Psychiatry

## 2021-11-13 ENCOUNTER — Encounter: Payer: Self-pay | Admitting: Obstetrics and Gynecology

## 2021-11-13 DIAGNOSIS — F319 Bipolar disorder, unspecified: Secondary | ICD-10-CM

## 2021-11-13 MED ORDER — OLANZAPINE 10 MG PO TABS: ORAL_TABLET | ORAL | 1 refills | Status: DC

## 2021-11-13 MED ORDER — LURASIDONE HCL 60 MG PO TABS: 60.0000 mg | ORAL_TABLET | Freq: Every evening | ORAL | 1 refills | Status: DC

## 2021-11-13 MED ORDER — LAMOTRIGINE ER 300 MG PO TB24: 300.0000 mg | ORAL_TABLET | Freq: Every day | ORAL | 1 refills | Status: DC

## 2021-11-13 NOTE — Progress Notes (Signed)
[name] ?[id number] ?[birth year] ?51 y.o. ? ?Subjective:  ? ?Patient ID:  [name] is a 51 y.o. (DOB [birth year]) female. ? ?Chief Complaint:  ?Chief Complaint  ?Patient presents with  ? Follow-up  ?  Bipolar D/O  ? ? ?HPI ?[name] presents to the office today for follow-up of Bipolar D/O and anxiety. She reports that in January she was having severe breast tenderness. She saw endocrinologist and she ordered a CT scan. She continues to have some galactorrhea. Prolactin level was normal.  ? ?She reports that she has periods where she has increased energy "but it's not taking over." Denies manic s/s. She denies any excessive spending. She reports that her mood has been "positive, upbeat." Denies depressed mood. She experiences some anxiety that is manageable. Appetite has been good. Concentration is adequate. She uses lists to help organize things. Sleeping 8-10 hours a night. Denies SI.  ? ?Work has been going well. She is preparing for presentations. She reports that she has been keeping up with work.  ? ?She is active in [employer].  ? ?Oldest son is turning 17. Another son just turned 8 and is involved in music. Her 56 yo son is doing soccer.  ? ?She had spring break last week and took her 2 youngest children to visit her father at [facility].  ? ?Has not needed Hydroxyzine prn.  ? ?Past Psychiatric Medication Trials: ?Lamictal- Has been helpful for mood. Reports that she has had fatigue with higher doses. ?Lithium- Wt gain ?Depakote ?Carbamazepine ?Topamax ?Seroquel ?Latuda- Has taken since 2010. She reports that this has been helpful for her mood. Has not been on doses higher than 40 mg. ?Olanzapine- Has been helpful for insomnia. Denies side effects. Has taken since 2000 ?Trilafon ?Effexor XR ?Prozac ?Celexa ?Buspar ?Ambien  ?Hydroxyzine ?  ?*Has never been on Abilify or Vraylar ?  ? ?AIMS   ? ?[place] Office Visit from 11/13/2021 in [place]  Visit from 06/02/2021 in [place] Visit from 08/14/2020 in [place] Visit from 04/04/2020 in [place] Visit from 01/25/2020 in [place]  ?AIMS Total Score 0 0 0 0 0  ? ?  ? ?GAD-7   ? ?[employer] [employer] Office Visit from 01/30/2021 in [place]  ?Total GAD-7 Score 0  ? ?  ? ?PHQ2-9   ? ?[place] Office Visit from 01/30/2021 in [place] Video Visit from 12/04/2020 in [place] Visit from 04/01/2020 in [place] Visit from 09/23/2017 in [place]  ?PHQ-2 Total Score 0 0 0 0  ?PHQ-9 Total Score 0 -- -- --  ? ?  ?  ? ?Review of Systems:  ?Review of Systems  ?Musculoskeletal:  Negative for gait problem.  ?     Improved knee pain  ?Neurological:  Negative for tremors.  ?Psychiatric/Behavioral:    ?     Please refer to HPI  ? ?Medications: I have reviewed the patient's current medications. ? ?Current Outpatient Medications  ?Medication Sig Dispense Refill  ? aspirin 325 MG tablet Take 325 mg by mouth daily.    ? celecoxib (CELEBREX) 200 MG capsule Take 200 mg by mouth daily.    ? nicotine polacrilex (NICORETTE) 4 MG gum Take 4 mg by mouth as needed for smoking cessation.    ? hydrOXYzine (VISTARIL) 25 MG capsule Take 1 capsule (25  mg total) by mouth 3 (three) times daily as needed. (Patient not taking: Reported on 07/07/2021) 30 capsule 0  ? LamoTRIgine (LAMICTAL XR) 300 MG TB24 24 hour tablet Take 1 tablet (300 mg total) by mouth daily. 90 tablet 1  ? Lurasidone HCl (LATUDA) 60 MG TABS Take 1 tablet (60 mg total) by mouth every evening. 90 tablet 1  ? OLANZapine (ZYPREXA) 10 MG tablet Take 1.5-2 tabs po QHS 180 tablet 1  ? polyethylene glycol (MIRALAX / GLYCOLAX) 17 g packet Take 17 g by mouth daily. (Patient not taking: Reported on 11/13/2021)    ? ?Current Facility-Administered Medications   ?Medication Dose Route Frequency Provider Last Rate Last Admin  ? 0.9 %  sodium chloride infusion  500 mL Intravenous Once Armbruster, Carlota Raspberry, MD      ? ? ?Medication Side Effects: None ? ?Allergies: No Known Allergies ? ?Past Medical History:  ?Diagnosis Date  ? Arthritis   ? osteoarthritis rt knee  ? Bipolar disorder ([place])   ? Gestational diabetes   ? gestational diabetes 2009  ? HSV (herpes simplex virus) anogenital infection   ? Vaginal Pap smear, abnormal   ? ? ?Past Medical History, Surgical history, Social history, and Family history were reviewed and updated as appropriate.  ? ?Please see review of systems for further details on the patient's review from today.  ? ?Objective:  ? ?Physical Exam:  ?Wt 139 lb (63 kg)   BMI 25.42 kg/m?  ? ?Physical Exam ?Constitutional:   ?   General: She is not in acute distress. ?Musculoskeletal:     ?   General: No deformity.  ?Neurological:  ?   Mental Status: She is alert and oriented to person, place, and time.  ?   Coordination: Coordination normal.  ?Psychiatric:     ?   Attention and Perception: Attention and perception normal. She does not perceive auditory or visual hallucinations.     ?   Mood and Affect: Mood normal. Mood is not anxious or depressed. Affect is not labile, blunt, angry or inappropriate.     ?   Speech: Speech normal.     ?   Behavior: Behavior normal.     ?   Thought Content: Thought content normal. Thought content is not paranoid or delusional. Thought content does not include homicidal or suicidal ideation. Thought content does not include homicidal or suicidal plan.     ?   Cognition and Memory: Cognition and memory normal.     ?   Judgment: Judgment normal.  ?   Comments: Insight intact  ? ? ?Lab Review:  ?   ?Component Value Date/Time  ? NA 141 10/17/2021 1210  ? K 4.5 10/17/2021 1210  ? CL 105 10/17/2021 1210  ? CO2 28 10/17/2021 1210  ? GLUCOSE 91 10/17/2021 1210  ? BUN 13 10/17/2021 1210  ? CREATININE 1.20 10/17/2021 1210  ? CALCIUM 10.0  10/17/2021 1210  ? PROT 7.2 06/08/2019 1530  ? ALBUMIN 4.5 06/08/2019 1530  ? AST 14 06/08/2019 1530  ? ALT 12 06/08/2019 1530  ? ALKPHOS 27 (L) 06/08/2019 1530  ? BILITOT 0.4 06/08/2019 1530  ? GFRNONAA >60 05/05/2017 1125  ? GFRAA >60 05/05/2017 1125  ? ? ?   ?Component Value Date/Time  ? WBC 7.5 06/08/2019 1530  ? RBC 3.86 06/08/2019 1530  ? HGB 11.9 06/08/2019 1530  ? HCT 34.7 (L) 06/08/2019 1530  ? PLT 308 06/08/2019 1530  ? MCV 89.9 06/08/2019 1530  ?  MCH 30.8 06/08/2019 1530  ? MCHC 34.3 06/08/2019 1530  ? RDW 12.6 06/08/2019 1530  ? LYMPHSABS 3.1 12/14/2017 1447  ? MONOABS 0.7 12/14/2017 1447  ? EOSABS 0.1 12/14/2017 1447  ? BASOSABS 0.0 12/14/2017 1447  ? ? ?No results found for: POCLITH, LITHIUM  ? ?No results found for: PHENYTOIN, PHENOBARB, VALPROATE, CBMZ  ? ?.res ?Assessment: Plan:   ?Pt seen for 25 minutes and time spent reviewing labs and discussing recent galactorrhea and breast pain. Reviewed labs and note from endocrinologist and prolactin levels are normal and therefore this does not seem to be due to medication. Also discussed that nicotine increases the clearance of Olanzapine and that if she stops nicotine gum in the future Olanzapine may need to be decreased.  ?Will continue Olanzapine 20 mg po QHS for mood s/s.  ?Continue Latuda 60 mg po qd with supper for mood s/s.  ?Continue Lamictal XR 300 mg po qd for mood s/s.  ?May use Hydroxyzine prn for anxiety or insomnia.  ?Pt to follow-up in 5-6 months or sooner if clinically indicated.  ?Patient advised to contact office with any questions, adverse effects, or acute worsening in signs and symptoms. ? ? ?[name] was seen today for follow-up. ? ?Diagnoses and all orders for this visit: ? ?Bipolar 1 disorder ([place]) ?-     Lurasidone HCl (LATUDA) 60 MG TABS; Take 1 tablet (60 mg total) by mouth every evening. ?-     LamoTRIgine (LAMICTAL XR) 300 MG TB24 24 hour tablet; Take 1 tablet (300 mg total) by mouth daily. ?-     OLANZapine (ZYPREXA) 10 MG  tablet; Take 1.5-2 tabs po QHS ? ?  ? ?Please see After Visit Summary for patient specific instructions. ? ?Future Appointments  ?Date Time Provider [place]  ?11/17/2021  8:30 AM GI-315 CT 1 GI-315[facility]

## 2021-11-17 ENCOUNTER — Other Ambulatory Visit: Payer: BC Managed Care – PPO

## 2021-11-24 ENCOUNTER — Ambulatory Visit: Payer: BC Managed Care – PPO | Admitting: Family Medicine

## 2021-11-24 ENCOUNTER — Encounter: Payer: Self-pay | Admitting: Family Medicine

## 2021-11-24 VITALS — BP 114/68 | HR 89 | Temp 97.5°F | Ht 62.0 in | Wt 141.0 lb

## 2021-11-24 DIAGNOSIS — J020 Streptococcal pharyngitis: Secondary | ICD-10-CM | POA: Diagnosis not present

## 2021-11-24 LAB — POCT RAPID STREP A (OFFICE): Rapid Strep A Screen: POSITIVE — AB

## 2021-11-24 MED ORDER — PENICILLIN V POTASSIUM 500 MG PO TABS: 500.0000 mg | ORAL_TABLET | Freq: Three times a day (TID) | ORAL | 0 refills | Status: AC

## 2021-11-24 NOTE — Progress Notes (Signed)
? ?Established Patient Office Visit ? ?Subjective:  ?Patient ID: [name], female    DOB: Apr 01, 1971  Age: 51 y.o. MRN: [id number] ? ?CC:  ?Chief Complaint  ?Patient presents with  ? Sore Throat  ?  Sore throat and fever x 2 days.   ? ? ?HPI ?[name] presents for evaluation of a 2-day history of sore throat with fevers chills, poor appetite, myalgias and arthralgias.  Denies headache or cough. ? ?Past Medical History:  ?Diagnosis Date  ? Arthritis   ? osteoarthritis rt knee  ? Bipolar disorder ([place])   ? Gestational diabetes   ? gestational diabetes 2009  ? HSV (herpes simplex virus) anogenital infection   ? Vaginal Pap smear, abnormal   ? ? ?Past Surgical History:  ?Procedure Laterality Date  ? ANKLE RECONSTRUCTION Bilateral   ? CESAREAN SECTION    ? CESAREAN SECTION N/A 06/28/2014  ? Procedure: CESAREAN SECTION;  Surgeon: Olga Millers, MD;  Location: [facility];  Service: Obstetrics;  Laterality: N/A;  ? CESAREAN SECTION    ? LEEP    ? ? ?Family History  ?Problem Relation Age of Onset  ? Depression Mother   ? Bipolar disorder Mother   ? Personality disorder Mother   ? Irritable bowel syndrome Mother   ? Diabetes Father   ?     2  ? Diabetes Mellitus II Father   ? Alcohol abuse Father   ? Colon polyps Father 45  ? Pancreatic cancer Maternal Grandmother   ? Hypertension Maternal Grandfather   ? Heart disease Maternal Grandfather   ? ADD / ADHD Son   ? ODD Son   ? Colon cancer Neg Hx   ? Esophageal cancer Neg Hx   ? Stomach cancer Neg Hx   ? Rectal cancer Neg Hx   ? ? ?Social History  ? ?Socioeconomic History  ? Marital status: Married  ?  Spouse name: Not on file  ? Number of children: Not on file  ? Years of education: Not on file  ? Highest education level: Not on file  ?Occupational History  ?  Employer: [employer]   ?Tobacco Use  ? Smoking status: Former  ?  Packs/day: 0.50  ?  Types: Cigarettes  ?  Quit date: 03/06/2019  ?  Years since quitting: 2.7  ? Smokeless tobacco: Never  ?Vaping Use   ? Vaping Use: Never used  ?Substance and Sexual Activity  ? Alcohol use: No  ?  Comment: Last use 2019  ? Drug use: No  ? Sexual activity: Yes  ?Other Topics Concern  ? Not on file  ?Social History Narrative  ? Not on file  ? ?Social Determinants of Health  ? ?Financial Resource Strain: Not on file  ?Food Insecurity: Not on file  ?Transportation Needs: Not on file  ?Physical Activity: Not on file  ?Stress: Not on file  ?Social Connections: Not on file  ?Intimate Partner Violence: Not on file  ? ? ?Outpatient Medications Prior to Visit  ?Medication Sig Dispense Refill  ? aspirin 325 MG tablet Take 325 mg by mouth daily.    ? celecoxib (CELEBREX) 200 MG capsule Take 200 mg by mouth daily.    ? hydrOXYzine (VISTARIL) 25 MG capsule Take 1 capsule (25 mg total) by mouth 3 (three) times daily as needed. 30 capsule 0  ? LamoTRIgine (LAMICTAL XR) 300 MG TB24 24 hour tablet Take 1 tablet (300 mg total) by mouth daily. 90 tablet 1  ?  Lurasidone HCl (LATUDA) 60 MG TABS Take 1 tablet (60 mg total) by mouth every evening. 90 tablet 1  ? nicotine polacrilex (NICORETTE) 4 MG gum Take 4 mg by mouth as needed for smoking cessation.    ? OLANZapine (ZYPREXA) 10 MG tablet Take 1.5-2 tabs po QHS 180 tablet 1  ? polyethylene glycol (MIRALAX / GLYCOLAX) 17 g packet Take 17 g by mouth daily. (Patient not taking: Reported on 11/13/2021)    ? ?Facility-Administered Medications Prior to Visit  ?Medication Dose Route Frequency Provider Last Rate Last Admin  ? 0.9 %  sodium chloride infusion  500 mL Intravenous Once Armbruster, Carlota Raspberry, MD      ? ? ?No Known Allergies ? ?ROS ?Review of Systems  ?Constitutional:  Positive for chills, fatigue and fever. Negative for diaphoresis and unexpected weight change.  ?HENT:  Positive for sore throat. Negative for congestion, postnasal drip and voice change.   ?Respiratory:  Negative for cough.   ?Gastrointestinal:  Negative for abdominal pain and vomiting.  ?Musculoskeletal:  Positive for arthralgias  and myalgias.  ?Neurological:  Positive for headaches.  ? ?  ?Objective:  ?  ?Physical Exam ?Vitals and nursing note reviewed.  ?Constitutional:   ?   General: She is not in acute distress. ?   Appearance: She is well-developed. She is not ill-appearing, toxic-appearing or diaphoretic.  ?HENT:  ?   Head: Normocephalic and atraumatic.  ?   Right Ear: Ear canal normal. No drainage, swelling or tenderness. No middle ear effusion. Tympanic membrane is not erythematous.  ?   Left Ear: Ear canal normal. No drainage, swelling or tenderness.  No middle ear effusion. Tympanic membrane is not erythematous.  ?   Mouth/Throat:  ?   Mouth: Mucous membranes are moist.  ?   Pharynx: Uvula midline. Posterior oropharyngeal erythema present. No oropharyngeal exudate.  ?   Tonsils: No tonsillar exudate. 1+ on the right. 1+ on the left.  ?Eyes:  ?   Extraocular Movements:  ?   Right eye: Normal extraocular motion.  ?   Left eye: Normal extraocular motion.  ?   Conjunctiva/sclera: Conjunctivae normal.  ?   Pupils: Pupils are equal, round, and reactive to light.  ?Cardiovascular:  ?   Rate and Rhythm: Normal rate and regular rhythm.  ?Pulmonary:  ?   Effort: Pulmonary effort is normal.  ?   Breath sounds: Normal breath sounds.  ?Musculoskeletal:  ?   Cervical back: Normal range of motion and neck supple.  ?Lymphadenopathy:  ?   Cervical: Cervical adenopathy present.  ?Skin: ?   General: Skin is warm and dry.  ?Neurological:  ?   Mental Status: She is alert and oriented to person, place, and time.  ?Psychiatric:     ?   Mood and Affect: Mood normal.     ?   Behavior: Behavior normal.  ? ? ?BP 114/68 (BP Location: Right Arm, Patient Position: Sitting, Cuff Size: Normal)   Pulse 89   Temp (!) 97.5 ?F (36.4 ?C) (Temporal)   Ht '5\' 2"'$  (1.575 m)   Wt 141 lb (64 kg)   SpO2 99%   BMI 25.79 kg/m?  ?Wt Readings from Last 3 Encounters:  ?11/24/21 141 lb (64 kg)  ?10/17/21 139 lb (63 kg)  ?07/02/21 137 lb (62.1 kg)  ? ? ? ?Health Maintenance  Due  ?Topic Date Due  ? Hepatitis C Screening  Never done  ? ? ?There are no preventive care reminders to display for  this patient. ? ?Lab Results  ?Component Value Date  ? TSH 1.58 10/17/2021  ? ?Lab Results  ?Component Value Date  ? WBC 7.5 06/08/2019  ? HGB 11.9 06/08/2019  ? HCT 34.7 (L) 06/08/2019  ? MCV 89.9 06/08/2019  ? PLT 308 06/08/2019  ? ?Lab Results  ?Component Value Date  ? NA 141 10/17/2021  ? K 4.5 10/17/2021  ? CO2 28 10/17/2021  ? GLUCOSE 91 10/17/2021  ? BUN 13 10/17/2021  ? CREATININE 1.20 10/17/2021  ? BILITOT 0.4 06/08/2019  ? ALKPHOS 27 (L) 06/08/2019  ? AST 14 06/08/2019  ? ALT 12 06/08/2019  ? PROT 7.2 06/08/2019  ? ALBUMIN 4.5 06/08/2019  ? CALCIUM 10.0 10/17/2021  ? ANIONGAP 13 05/05/2017  ? GFR 53.45 (L) 10/17/2021  ? ?Lab Results  ?Component Value Date  ? CHOL 248 (H) 01/30/2021  ? ?Lab Results  ?Component Value Date  ? HDL 86.30 01/30/2021  ? ?Lab Results  ?Component Value Date  ? LDLCALC 152 (H) 01/30/2021  ? ?Lab Results  ?Component Value Date  ? TRIG 48.0 01/30/2021  ? ?Lab Results  ?Component Value Date  ? CHOLHDL 3 01/30/2021  ? ?Lab Results  ?Component Value Date  ? HGBA1C 5.4 10/20/2010  ? ? ?  ?Assessment & Plan:  ? ?Problem List Items Addressed This Visit   ? ?  ? Respiratory  ? Strep pharyngitis - Primary  ? Relevant Medications  ? penicillin v potassium (VEETID) 500 MG tablet  ? Other Relevant Orders  ? POCT rapid strep A  ? ? ?Meds ordered this encounter  ?Medications  ? penicillin v potassium (VEETID) 500 MG tablet  ?  Sig: Take 1 tablet (500 mg total) by mouth 3 (three) times daily for 10 days.  ?  Dispense:  30 tablet  ?  Refill:  0  ? ? ?Follow-up: Return if symptoms worsen or fail to improve.  ? ? ?Libby Maw, MD ?

## 2022-01-16 ENCOUNTER — Ambulatory Visit: Payer: BC Managed Care – PPO | Admitting: Family Medicine

## 2022-02-04 ENCOUNTER — Ambulatory Visit (INDEPENDENT_AMBULATORY_CARE_PROVIDER_SITE_OTHER): Payer: BC Managed Care – PPO | Admitting: Family Medicine

## 2022-02-04 ENCOUNTER — Encounter: Payer: Self-pay | Admitting: Family Medicine

## 2022-02-04 VITALS — BP 116/68 | HR 81 | Temp 97.4°F | Ht 62.0 in | Wt 141.6 lb

## 2022-02-04 DIAGNOSIS — Z0000 Encounter for general adult medical examination without abnormal findings: Secondary | ICD-10-CM

## 2022-02-04 LAB — URINALYSIS, ROUTINE W REFLEX MICROSCOPIC
Bilirubin Urine: NEGATIVE
Hgb urine dipstick: NEGATIVE
Ketones, ur: NEGATIVE
Leukocytes,Ua: NEGATIVE
Nitrite: NEGATIVE
RBC / HPF: NONE SEEN (ref 0–?)
Specific Gravity, Urine: <=1.005 — AB (ref 1.000–1.030)
Total Protein, Urine: NEGATIVE
Urine Glucose: NEGATIVE
Urobilinogen, UA: 0.2 (ref 0.0–1.0)
WBC, UA: NONE SEEN (ref 0–?)
pH: 7.5 (ref 5.0–8.0)

## 2022-02-04 LAB — COMPREHENSIVE METABOLIC PANEL
ALT: 10 U/L (ref 0–35)
AST: 14 U/L (ref 0–37)
Albumin: 4.3 g/dL (ref 3.5–5.2)
Alkaline Phosphatase: 42 U/L (ref 39–117)
BUN: 12 mg/dL (ref 6–23)
CO2: 28 mEq/L (ref 19–32)
Calcium: 9.6 mg/dL (ref 8.4–10.5)
Chloride: 104 mEq/L (ref 96–112)
Creatinine, Ser: 1.12 mg/dL (ref 0.40–1.20)
GFR: 57.94 mL/min — ABNORMAL LOW (ref >60.00)
Glucose, Bld: 90 mg/dL (ref 70–99)
Potassium: 4.6 mEq/L (ref 3.5–5.1)
Sodium: 139 mEq/L (ref 135–145)
Total Bilirubin: 0.2 mg/dL (ref 0.2–1.2)
Total Protein: 6.8 g/dL (ref 6.0–8.3)

## 2022-02-04 LAB — CBC
HCT: 38.7 % (ref 36.0–46.0)
Hemoglobin: 12.7 g/dL (ref 12.0–15.0)
MCHC: 32.9 g/dL (ref 30.0–36.0)
MCV: 90.1 fl (ref 78.0–100.0)
Platelets: 309 10*3/uL (ref 150.0–400.0)
RBC: 4.29 Mil/uL (ref 3.87–5.11)
RDW: 13.7 % (ref 11.5–15.5)
WBC: 8.6 10*3/uL (ref 4.0–10.5)

## 2022-02-04 LAB — LIPID PANEL
Cholesterol: 232 mg/dL — ABNORMAL HIGH (ref 0–200)
HDL: 66.5 mg/dL (ref >39.00)
LDL Cholesterol: 150 mg/dL — ABNORMAL HIGH (ref 0–99)
NonHDL: 165.32
Total CHOL/HDL Ratio: 3
Triglycerides: 76 mg/dL (ref 0.0–149.0)
VLDL: 15.2 mg/dL (ref 0.0–40.0)

## 2022-02-04 NOTE — Progress Notes (Signed)
Established Patient Office Visit  Subjective   Patient ID: [name], female    DOB: 05-Dec-1970  Age: 51 y.o. MRN: [id number]  Chief Complaint  Patient presents with   Annual Exam    CPE, no concerns. Patient fasting.     HPI here fasting for health check.  Doing well.  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  We will do some online courses from her parents house down in [facility].  Some stress at home with husband.  His paranoid schizophrenia apparently is not under good control at this point.  96 year old son is a typical teenager.  49 year old son is starting music production at [employer] this fall.  Bipolar disease well managed by psychiatry.  She is not exercising regularly.  She does have regular dental  and GYN care    Review of Systems  Constitutional: Negative.   HENT: Negative.    Eyes:  Negative for blurred vision, discharge and redness.  Respiratory: Negative.    Cardiovascular: Negative.   Gastrointestinal:  Negative for abdominal pain.  Genitourinary: Negative.   Musculoskeletal: Negative.  Negative for myalgias.  Skin:  Negative for rash.  Neurological:  Negative for tingling, loss of consciousness and weakness.  Endo/Heme/Allergies:  Negative for polydipsia.     Objective:     BP 116/68 (BP Location: Right Arm, Patient Position: Sitting, Cuff Size: Normal)   Pulse 81   Temp (!) 97.4 F (36.3 C) (Temporal)   Ht '5\' 2"'$  (1.575 m)   Wt 141 lb 9.6 oz (64.2 kg)   SpO2 99%   BMI 25.90 kg/m    Physical Exam Constitutional:      General: She is not in acute distress.    Appearance: Normal appearance. She is not ill-appearing, toxic-appearing or diaphoretic.  HENT:     Head: Normocephalic and atraumatic.     Right Ear: External ear normal.     Left Ear: External ear normal.     Mouth/Throat:     Mouth: Mucous membranes are moist.     Pharynx: Oropharynx is clear. No oropharyngeal exudate or posterior oropharyngeal  erythema.  Eyes:     General: No scleral icterus.       Right eye: No discharge.        Left eye: No discharge.     Extraocular Movements: Extraocular movements intact.     Conjunctiva/sclera: Conjunctivae normal.     Pupils: Pupils are equal, round, and reactive to light.  Cardiovascular:     Rate and Rhythm: Normal rate and regular rhythm.  Pulmonary:     Effort: Pulmonary effort is normal. No respiratory distress.     Breath sounds: Normal breath sounds.  Abdominal:     General: Bowel sounds are normal.     Tenderness: There is no abdominal tenderness. There is no guarding.  Musculoskeletal:     Cervical back: No rigidity or tenderness.  Skin:    General: Skin is warm and dry.  Neurological:     Mental Status: She is alert and oriented to person, place, and time.  Psychiatric:        Mood and Affect: Mood normal.        Behavior: Behavior normal.     No results found for any visits on 02/04/22.    The 10-year ASCVD risk score (Arnett DK, et al., 2019) is: 0.7%    Assessment & Plan:   Problem List Items Addressed This Visit  None Visit Diagnoses     Healthcare maintenance    -  Primary   Relevant Orders   CBC   Comprehensive metabolic panel   Urinalysis, Routine w reflex microscopic   Lipid panel       Return in about 1 year (around 02/05/2023).  Encouraged regular exercise even if it is just walking for 30 minutes most days of the week.  Information was given on health maintenance and disease prevention.  Suggested that husband have a consultation with the MD and his mental health practice.  Libby Maw, MD

## 2022-04-16 ENCOUNTER — Ambulatory Visit: Payer: BC Managed Care – PPO | Admitting: Psychiatry

## 2022-04-22 ENCOUNTER — Ambulatory Visit: Payer: BC Managed Care – PPO | Admitting: Psychiatry

## 2022-05-11 ENCOUNTER — Ambulatory Visit: Payer: BC Managed Care – PPO | Admitting: Psychiatry

## 2022-05-17 ENCOUNTER — Other Ambulatory Visit: Payer: Self-pay | Admitting: Psychiatry

## 2022-05-17 DIAGNOSIS — F319 Bipolar disorder, unspecified: Secondary | ICD-10-CM

## 2022-06-01 ENCOUNTER — Ambulatory Visit (INDEPENDENT_AMBULATORY_CARE_PROVIDER_SITE_OTHER): Payer: BC Managed Care – PPO | Admitting: Psychiatry

## 2022-06-01 ENCOUNTER — Encounter: Payer: Self-pay | Admitting: Psychiatry

## 2022-06-01 VITALS — Wt 140.0 lb

## 2022-06-01 DIAGNOSIS — F419 Anxiety disorder, unspecified: Secondary | ICD-10-CM | POA: Diagnosis not present

## 2022-06-01 DIAGNOSIS — F319 Bipolar disorder, unspecified: Secondary | ICD-10-CM | POA: Diagnosis not present

## 2022-06-01 MED ORDER — OLANZAPINE 10 MG PO TABS: ORAL_TABLET | ORAL | 1 refills | Status: DC

## 2022-06-01 MED ORDER — LAMOTRIGINE ER 300 MG PO TB24: 300.0000 mg | ORAL_TABLET | Freq: Every day | ORAL | 1 refills | Status: DC

## 2022-06-01 MED ORDER — LURASIDONE HCL 60 MG PO TABS: 60.0000 mg | ORAL_TABLET | Freq: Every evening | ORAL | 1 refills | Status: DC

## 2022-06-01 NOTE — Progress Notes (Signed)
[name] [id number] 1971-04-06 51 y.o.  Subjective:   Patient ID:  [name] is a 51 y.o. (DOB [birth year]) female.  Chief Complaint:  Chief Complaint  Patient presents with   Follow-up    Anxiety, bipolar disorder    HPI [name] presents to the office today for follow-up of anxiety and bipolar disorder. Denies depressed mood. Denies manic s/s. Sleeping well. She denies excessive anxiety. Energy and motivation have been good. Appetite has been good. Concentration is adequate. Denies SI.   Aunt died recently that she was close with. She was able to cry and grieve this loss. Then her 17 yo cat died.  She then rescued a kitten.   Her 28 yo son has been sick for several weeks with dizziness and nausea. Son has been referred to [facility] for further medical work-up. Her 49 yo is doing well. Her 49 yo is at [facility] [employer]. She is enjoying her job. She is teaching some Museum/gallery exhibitions officer. She resigned from position for [employer].   Continues to see individual therapist and couples therapist.   Past Psychiatric Medication Trials: Lamictal- Has been helpful for mood. Reports that she has had fatigue with higher doses. Lithium- Wt gain Depakote Carbamazepine Topamax Seroquel Latuda- Has taken since 2010. She reports that this has been helpful for her mood. Has not been on doses higher than 40 mg. Olanzapine- Has been helpful for insomnia. Denies side effects. Has taken since 2000 Trilafon Effexor XR Prozac Celexa Buspar Ambien  Hydroxyzine   *Has never been on Abilify or [place] Office Visit from 06/01/2022 in [place] Visit from 11/13/2021 in [place] Visit from 06/02/2021 in [place] from 08/14/2020 in [place] Visit from 04/04/2020 in [place] Total Score 0 0 0 0 0      [place] Visit from 02/04/2022 in [place] from 01/30/2021 in [place]  Total GAD-7 Score 0 0      PHQ2-9    [place] Visit from 02/04/2022 in [place] Visit from 11/24/2021 in [place] from 01/30/2021 in [place] from 12/04/2020 in [place] Visit from 04/01/2020 in [place]  PHQ-2 Total Score 0 0 0 0 0  PHQ-9 Total Score 0 -- 0 -- --        Review of Systems:  Review of Systems  Musculoskeletal:  Negative for gait problem.  Neurological:  Negative for tremors.  Psychiatric/Behavioral:         Please refer to HPI    Medications: I have reviewed the patient's current medications.  Current Outpatient Medications  Medication Sig Dispense Refill   celecoxib (CELEBREX) 200 MG capsule Take 200 mg by mouth daily.     nicotine polacrilex (NICORETTE) 4 MG gum Take 4 mg by mouth as needed for smoking cessation.     hydrOXYzine (VISTARIL) 25 MG capsule Take 1 capsule (25 mg total) by mouth 3 (three) times daily as needed. (Patient not taking: Reported on 06/01/2022) 30 capsule 0   LamoTRIgine (LAMICTAL XR) 300 MG TB24 24 hour tablet Take 1 tablet (300 mg total) by mouth daily. 90 tablet 1   Lurasidone HCl (LATUDA) 60 MG TABS Take 1 tablet (60  mg total) by mouth every evening. 90 tablet 1   OLANZapine (ZYPREXA) 10 MG tablet TAKE 1.5 TO 2 TABLETS BY MOUTH AT BEDTIME 180 tablet 1   Current Facility-Administered Medications  Medication Dose Route Frequency Provider Last Rate Last Admin   0.9 %  sodium chloride infusion  500 mL Intravenous Once Armbruster, Carlota Raspberry, MD        Medication Side Effects: None  Allergies: No Known Allergies  Past Medical History:  Diagnosis Date   Arthritis    osteoarthritis rt knee   Bipolar disorder ([place])    Gestational diabetes     gestational diabetes 2009   HSV (herpes simplex virus) anogenital infection    Vaginal Pap smear, abnormal     Past Medical History, Surgical history, Social history, and Family history were reviewed and updated as appropriate.   Please see review of systems for further details on the patient's review from today.   Objective:   Physical Exam:  Wt 140 lb (63.5 kg)   BMI 25.61 kg/m   Physical Exam Constitutional:      General: She is not in acute distress. Musculoskeletal:        General: No deformity.  Neurological:     Mental Status: She is alert and oriented to person, place, and time.     Coordination: Coordination normal.  Psychiatric:        Attention and Perception: Attention and perception normal. She does not perceive auditory or visual hallucinations.        Mood and Affect: Mood normal. Mood is not anxious or depressed. Affect is not labile, blunt, angry or inappropriate.        Speech: Speech normal.        Behavior: Behavior normal.        Thought Content: Thought content normal. Thought content is not paranoid or delusional. Thought content does not include homicidal or suicidal ideation. Thought content does not include homicidal or suicidal plan.        Cognition and Memory: Cognition and memory normal.        Judgment: Judgment normal.     Comments: Insight intact     Lab Review:     Component Value Date/Time   NA 139 02/04/2022 0956   K 4.6 02/04/2022 0956   CL 104 02/04/2022 0956   CO2 28 02/04/2022 0956   GLUCOSE 90 02/04/2022 0956   BUN 12 02/04/2022 0956   CREATININE 1.12 02/04/2022 0956   CALCIUM 9.6 02/04/2022 0956   PROT 6.8 02/04/2022 0956   ALBUMIN 4.3 02/04/2022 0956   AST 14 02/04/2022 0956   ALT 10 02/04/2022 0956   ALKPHOS 42 02/04/2022 0956   BILITOT 0.2 02/04/2022 0956   GFRNONAA >60 05/05/2017 1125   GFRAA >60 05/05/2017 1125       Component Value Date/Time   WBC 8.6 02/04/2022 0956   RBC 4.29 02/04/2022 0956   HGB 12.7  02/04/2022 0956   HCT 38.7 02/04/2022 0956   PLT 309.0 02/04/2022 0956   MCV 90.1 02/04/2022 0956   MCH 30.8 06/08/2019 1530   MCHC 32.9 02/04/2022 0956   RDW 13.7 02/04/2022 0956   LYMPHSABS 3.1 12/14/2017 1447   MONOABS 0.7 12/14/2017 1447   EOSABS 0.1 12/14/2017 1447   BASOSABS 0.0 12/14/2017 1447    No results found for: "POCLITH", "LITHIUM"   No results found for: "PHENYTOIN", "PHENOBARB", "VALPROATE", "CBMZ"   .res Assessment: Plan:    Will continue current plan of care since  target signs and symptoms are well controlled without any tolerability issues. Reviewed labs from PCP and labs were WNL to include lipid panel. Discussed that she does not appear to be experiencing metabolic side effects with atypical antipsychotics and weight remains stable.  Continue Olanzapine 15-20 mg po QHS for mood stabilization.  Continue Latuda 60 mg daily in the evening for mood stabilization.  Continue Lamotrigine XR 300 mg po qd for mood stabilization. Pt to follow-up in 6 months or sooner if clinically indicated.  Recommend continuing psychotherapy.  Patient advised to contact office with any questions, adverse effects, or acute worsening in signs and symptoms.   [name] was seen today for follow-up.  Diagnoses and all orders for this visit:  Bipolar 1 disorder ([place]) -     Lurasidone HCl (LATUDA) 60 MG TABS; Take 1 tablet (60 mg total) by mouth every evening. -     LamoTRIgine (LAMICTAL XR) 300 MG TB24 24 hour tablet; Take 1 tablet (300 mg total) by mouth daily. -     OLANZapine (ZYPREXA) 10 MG tablet; TAKE 1.5 TO 2 TABLETS BY MOUTH AT BEDTIME  Anxiety disorder, unspecified type     Please see After Visit Summary for patient specific instructions.  Future Appointments  Date Time Provider [place]  12/02/2022  9:00 AM Thayer Headings, PMHNP [facility] None    No orders of the defined types were placed in this encounter.   -------------------------------

## 2022-06-12 ENCOUNTER — Ambulatory Visit (INDEPENDENT_AMBULATORY_CARE_PROVIDER_SITE_OTHER): Payer: BC Managed Care – PPO | Admitting: Family Medicine

## 2022-06-12 ENCOUNTER — Encounter: Payer: Self-pay | Admitting: Family Medicine

## 2022-06-12 VITALS — BP 108/66 | HR 77 | Temp 98.3°F | Ht 62.0 in | Wt 143.2 lb

## 2022-06-12 DIAGNOSIS — Z716 Tobacco abuse counseling: Secondary | ICD-10-CM

## 2022-06-12 DIAGNOSIS — Z720 Tobacco use: Secondary | ICD-10-CM

## 2022-06-12 DIAGNOSIS — Z6379 Other stressful life events affecting family and household: Secondary | ICD-10-CM | POA: Diagnosis not present

## 2022-06-12 MED ORDER — NICOTINE POLACRILEX 4 MG MT GUM: 4.0000 mg | CHEWING_GUM | OROMUCOSAL | 1 refills | Status: DC | PRN

## 2022-06-12 NOTE — Progress Notes (Signed)
   Established Patient Office Visit  Subjective   Patient ID: [name], female    DOB: [birth year]  Age: 51 y.o. MRN: [id number]  Chief Complaint  Patient presents with   Follow-up    Follow up on smoking patient need refill on nicorette gum, little anxiety issues.     HPI presents for smoking cessation counseling.  Feels ready to try.  Nicorette gum has helped in the past.  She would like a refill on the prescription.  Highly stressful time for her.  An aunt that she was close to just passed this last week.  Her 22 year old cat at home died.  Her 74 year old son recently admitted for marijuana abuse 3 days ago.  Inpatient treatment may last 11 days.  There will be a prolonged IOP.    Review of Systems  Constitutional: Negative.   HENT: Negative.    Eyes:  Negative for blurred vision, discharge and redness.  Respiratory: Negative.    Cardiovascular: Negative.   Gastrointestinal:  Negative for abdominal pain.  Genitourinary: Negative.   Musculoskeletal: Negative.  Negative for myalgias.  Skin:  Negative for rash.  Neurological:  Negative for tingling, loss of consciousness and weakness.  Endo/Heme/Allergies:  Negative for polydipsia.  Psychiatric/Behavioral:  Negative for substance abuse and suicidal ideas. The patient is nervous/anxious.       Objective:     BP 108/66 (BP Location: Right Arm, Patient Position: Sitting, Cuff Size: Normal)   Pulse 77   Temp 98.3 F (36.8 C) (Temporal)   Ht '5\' 2"'$  (1.575 m)   Wt 143 lb 3.2 oz (65 kg)   SpO2 99%   BMI 26.19 kg/m    Physical Exam Constitutional:      General: She is not in acute distress.    Appearance: Normal appearance. She is not ill-appearing, toxic-appearing or diaphoretic.  HENT:     Head: Normocephalic and atraumatic.     Right Ear: External ear normal.     Left Ear: External ear normal.  Eyes:     General: No scleral icterus.       Right eye: No discharge.        Left eye: No discharge.     Extraocular  Movements: Extraocular movements intact.     Conjunctiva/sclera: Conjunctivae normal.  Pulmonary:     Effort: Pulmonary effort is normal. No respiratory distress.  Skin:    General: Skin is warm and dry.  Neurological:     Mental Status: She is alert and oriented to person, place, and time.  Psychiatric:        Mood and Affect: Mood normal.        Behavior: Behavior normal.      No results found for any visits on 06/12/22.    The 10-year ASCVD risk score (Arnett DK, et al., 2019) is: 0.8%    Assessment & Plan:   Problem List Items Addressed This Visit   None Visit Diagnoses     Tobacco use    -  Primary   Encounter for smoking cessation counseling       Relevant Medications   nicotine polacrilex (NICORETTE) 4 MG gum   Stress due to illness of family member           Return if symptoms worsen or fail to improve.    Libby Maw, MD

## 2022-06-24 ENCOUNTER — Telehealth: Payer: Self-pay | Admitting: Psychiatry

## 2022-06-24 DIAGNOSIS — F319 Bipolar disorder, unspecified: Secondary | ICD-10-CM

## 2022-06-24 NOTE — Telephone Encounter (Signed)
Patient called in regarding her medication Zyprexa. She currently takes 1-2 '10mg'$  tablets a day but says the '10mg'$  are hard to cut. She would like to switch to '5mg'$  so she can take her regular '10mg'$ . Ph: [facility]

## 2022-06-25 ENCOUNTER — Other Ambulatory Visit: Payer: Self-pay

## 2022-06-25 DIAGNOSIS — F319 Bipolar disorder, unspecified: Secondary | ICD-10-CM

## 2022-06-25 MED ORDER — OLANZAPINE 5 MG PO TABS: 5.0000 mg | ORAL_TABLET | Freq: Every day | ORAL | 0 refills | Status: DC

## 2022-06-25 NOTE — Telephone Encounter (Signed)
Please call to get more information. I think she has been taking 15 mg daily most recently. There is a 15 mg tab of Olanzapine that is available that we could send in. We could also send in 2 scripts, one for a 10 mg and one for a 5 mg to equal 15 mg. Insurance is unlikely to cover three 5 mg tabs daily.

## 2022-06-25 NOTE — Telephone Encounter (Signed)
She is taking 15 mg.She wants to take 15 mg and would rather have the 2 rx's sent.She said it's written as take 1-2 tabs and she wants the 5 mg tab in case she needs to increase back to 20 she can instead of taking a 15 mg tablet.I will send it in.

## 2022-06-25 NOTE — Telephone Encounter (Signed)
[name] called back again this morning at 11:30 to check status of her call yesterday about her Zyprexa.  I told her it was being reviewed by the Dr.  She just again requested a call back today.

## 2022-06-25 NOTE — Telephone Encounter (Signed)
Please review

## 2022-07-18 ENCOUNTER — Other Ambulatory Visit: Payer: Self-pay | Admitting: Psychiatry

## 2022-10-01 ENCOUNTER — Telehealth: Payer: Self-pay | Admitting: Psychiatry

## 2022-10-01 DIAGNOSIS — F419 Anxiety disorder, unspecified: Secondary | ICD-10-CM

## 2022-10-01 MED ORDER — HYDROXYZINE PAMOATE 25 MG PO CAPS: 25.0000 mg | ORAL_CAPSULE | Freq: Three times a day (TID) | ORAL | 2 refills | Status: DC | PRN

## 2022-10-01 NOTE — Telephone Encounter (Signed)
Pt requesting refill of hydroxyzine. Script sent.

## 2022-10-24 ENCOUNTER — Other Ambulatory Visit: Payer: Self-pay | Admitting: Psychiatry

## 2022-10-24 DIAGNOSIS — F419 Anxiety disorder, unspecified: Secondary | ICD-10-CM

## 2022-12-02 ENCOUNTER — Ambulatory Visit: Payer: BC Managed Care – PPO | Admitting: Psychiatry

## 2022-12-16 ENCOUNTER — Ambulatory Visit: Payer: BC Managed Care – PPO | Admitting: Psychiatry

## 2022-12-16 ENCOUNTER — Encounter: Payer: Self-pay | Admitting: Psychiatry

## 2022-12-16 DIAGNOSIS — F419 Anxiety disorder, unspecified: Secondary | ICD-10-CM

## 2022-12-16 DIAGNOSIS — F17210 Nicotine dependence, cigarettes, uncomplicated: Secondary | ICD-10-CM

## 2022-12-16 DIAGNOSIS — F319 Bipolar disorder, unspecified: Secondary | ICD-10-CM | POA: Diagnosis not present

## 2022-12-16 MED ORDER — LAMOTRIGINE ER 300 MG PO TB24
300.0000 mg | ORAL_TABLET | Freq: Every day | ORAL | 1 refills | Status: DC
Start: 2022-12-16 — End: 2023-05-05

## 2022-12-16 MED ORDER — LURASIDONE HCL 60 MG PO TABS
60.0000 mg | ORAL_TABLET | Freq: Every evening | ORAL | 1 refills | Status: DC
Start: 2022-12-16 — End: 2023-05-05

## 2022-12-16 MED ORDER — OLANZAPINE 10 MG PO TABS
ORAL_TABLET | ORAL | 1 refills | Status: DC
Start: 2022-12-16 — End: 2023-05-05

## 2022-12-16 NOTE — Progress Notes (Signed)
[name] [id number] 28-Apr-1971 52 y.o.  Subjective:   Patient ID:  [name] is a 52 y.o. (DOB 07/13/1971) female.  Chief Complaint:  Chief Complaint  Patient presents with   Follow-up    Bipolar disorder, anxiety    HPI [name] presents to the office today for follow-up of bipolar disorder and anxiety. She stopped Celebrex  a few days ago and notices it has been slightly harder to wake up. She reports that her mood has been stable. Denies manic s/s. She report sthat she has felt "overwhelmed" at times with different deadlines and responsibilities. She reports that she has occasional irritability when overwhelmed. Anxiety has been manageable. She denies depressed mood. Sleeping well. Energy and motivation are ok overall. Appetite has been ok. Concentration has been good. She relies on lists and sticky notes. Denies SI.   She and her husband were considering separation in the past and have reconciled. Reports that one of her sons had substance misuse and this is now in remission.   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 She reports that her major work project was just completed and went well. Going to conference soon. Husband is in Midwife at [employer].   She continues to see Dr. Lorenda Cahill. They see Brett Canales for couples therapy.   Past Psychiatric Medication Trials: Lamictal- Has been helpful for mood. Reports that she has had fatigue with higher doses. Lithium- Wt gain Depakote Carbamazepine Topamax Seroquel Latuda- Has taken since 2010. She reports that this has been helpful for her mood. Has not been on doses higher than 40 mg. Olanzapine- Has been helpful for insomnia. Denies side effects. Has taken since 2000 Trilafon Effexor XR Prozac Celexa Buspar Ambien  Hydroxyzine   *Has never  been on Abilify or Vraylar  AIMS    Flowsheet Row Office Visit from 12/16/2022 in [facility] Office Visit from 06/01/2022 in [facility] Office Visit from 11/13/2021 in [facility] Office Visit from 06/02/2021 in [facility] Office Visit from 08/14/2020 in [facility]  AIMS Total Score 0 0 0 0 0      GAD-7    Flowsheet Row Office Visit from 02/04/2022 in [facility] Visit from 01/30/2021 in [facility] [place] [facility] [employer]  Total GAD-7 Score 0 0      PHQ2-9    Flowsheet Row Office Visit from 06/12/2022 in [facility] [place] [facility] Visit from 02/04/2022 in [facility] [place] [facility] Visit from 11/24/2021 in [facility] [place] [facility] [employer] Visit from 01/30/2021 in [facility] [place] [facility] [employer] Video Visit from 12/04/2020 in [facility] [employer]  PHQ-2 Total Score 0 0 0 0 0  PHQ-9 Total Score -- 0 -- 0 --        Review of Systems:  Review of Systems  Musculoskeletal:  Negative for gait problem.  Neurological:  Positive for headaches.       Notices some headaches recently that seem to occur after staring at a computer screen.   Psychiatric/Behavioral:         Please refer to HPI  Has not experienced any  recent lactation.   Medications: I have reviewed the patient's current medications.  Current Outpatient Medications  Medication Sig Dispense Refill   nicotine polacrilex (NICORETTE) 4 MG gum Take 4 mg by mouth as needed for smoking cessation.     nicotine polacrilex (NICORETTE) 4 MG gum Take 1 each (4 mg total) by mouth as needed for smoking cessation. 100 tablet 1   celecoxib (CELEBREX) 200 MG capsule Take 200 mg by mouth daily. (Patient not  taking: Reported on 12/16/2022)     LamoTRIgine (LAMICTAL XR) 300 MG TB24 24 hour tablet Take 1 tablet (300 mg total) by mouth daily. 90 tablet 1   Lurasidone HCl (LATUDA) 60 MG TABS Take 1 tablet (60 mg total) by mouth every evening. 90 tablet 1   OLANZapine (ZYPREXA) 10 MG tablet TAKE 1.5 TO 2 TABLETS BY MOUTH AT BEDTIME 180 tablet 1   Current Facility-Administered Medications  Medication Dose Route Frequency Provider Last Rate Last Admin   0.9 %  sodium chloride infusion  500 mL Intravenous Once Armbruster, Willaim Rayas, MD        Medication Side Effects: None  Allergies: No Known Allergies  Past Medical History:  Diagnosis Date   Arthritis    osteoarthritis rt knee   Bipolar disorder    Gestational diabetes    gestational diabetes 2009   HSV (herpes simplex virus) anogenital infection    Vaginal Pap smear, abnormal     Past Medical History, Surgical history, Social history, and Family history were reviewed and updated as appropriate.   Please see review of systems for further details on the patient's review from today.   Objective:   Physical Exam:  There were no vitals taken for this visit.  Physical Exam Constitutional:      General: She is not in acute distress. Musculoskeletal:        General: No deformity.  Neurological:     Mental Status: She is alert and oriented to person, place, and time.     Coordination: Coordination normal.  Psychiatric:        Attention and Perception: Attention and perception normal. She does not perceive auditory or visual hallucinations.        Mood and Affect: Mood normal. Mood is not anxious or depressed. Affect is not labile, blunt, angry or inappropriate.        Speech: Speech normal.        Behavior: Behavior normal.        Thought Content: Thought content normal. Thought content is not paranoid or delusional. Thought content does not include homicidal or suicidal ideation. Thought content does not include homicidal or suicidal plan.         Cognition and Memory: Cognition and memory normal.        Judgment: Judgment normal.     Comments: Insight intact     Lab Review:     Component Value Date/Time   NA 139 02/04/2022 0956   K 4.6 02/04/2022 0956   CL 104 02/04/2022 0956   CO2 28 02/04/2022 0956   GLUCOSE 90 02/04/2022 0956   BUN 12 02/04/2022 0956   CREATININE 1.12 02/04/2022 0956   CALCIUM 9.6 02/04/2022 0956   PROT 6.8 02/04/2022 0956   ALBUMIN 4.3 02/04/2022 0956   AST 14 02/04/2022 0956   ALT 10 02/04/2022 0956   ALKPHOS 42 02/04/2022 0956   BILITOT 0.2 02/04/2022 0956   GFRNONAA >60 05/05/2017 1125   GFRAA >60 05/05/2017 1125  Component Value Date/Time   WBC 8.6 02/04/2022 0956   RBC 4.29 02/04/2022 0956   HGB 12.7 02/04/2022 0956   HCT 38.7 02/04/2022 0956   PLT 309.0 02/04/2022 0956   MCV 90.1 02/04/2022 0956   MCH 30.8 06/08/2019 1530   MCHC 32.9 02/04/2022 0956   RDW 13.7 02/04/2022 0956   LYMPHSABS 3.1 12/14/2017 1447   MONOABS 0.7 12/14/2017 1447   EOSABS 0.1 12/14/2017 1447   BASOSABS 0.0 12/14/2017 1447    No results found for: "POCLITH", "LITHIUM"   No results found for: "PHENYTOIN", "PHENOBARB", "VALPROATE", "CBMZ"   .res Assessment: Plan:    Patient seen for 25 minutes and time spent discussing changes and psychosocial history since last visit and recent symptoms.  She reports that overall her mood and anxiety symptoms have been well-controlled.  She reports some concern about difficulty waking up the last few morning since she discontinued Celebrex.  She requests earlier follow-up next month in the event that this does not resolve.  She reports that she will cancel appointment and schedule further out if she longer is experiencing difficulty waking up.  She reports that she prefers not to make any medication changes over the next month since it is the end of the semester and a busy time at work. She reports that she is now buying nicotine gum over the counter and plans to  try to decrease the use of nicotine gum on an alternate with nicotine free gum over the summer. Will continue Lamictal XR 300 mg daily for mood stabilization. Continue Latuda 60 mg daily for mood stabilization. Continue olanzapine 15-20 mg at bedtime for mood stabilization. Pt to follow-up with this provider in 4 weeks or sooner if clinically indicated.  Recommend continuing psychotherapy.  Patient advised to contact office with any questions, adverse effects, or acute worsening in signs and symptoms.   [name] was seen today for follow-up.  Diagnoses and all orders for this visit:  Bipolar 1 disorder -     LamoTRIgine (LAMICTAL XR) 300 MG TB24 24 hour tablet; Take 1 tablet (300 mg total) by mouth daily. -     Lurasidone HCl (LATUDA) 60 MG TABS; Take 1 tablet (60 mg total) by mouth every evening. -     OLANZapine (ZYPREXA) 10 MG tablet; TAKE 1.5 TO 2 TABLETS BY MOUTH AT BEDTIME  Anxiety disorder, unspecified type  Cigarette nicotine dependence without complication     Please see After Visit Summary for patient specific instructions.  Future Appointments  Date Time Provider Department Center  01/11/2023 10:30 AM Corie Chiquito, PMHNP [facility] None     No orders of the defined types were placed in this encounter.   -------------------------------

## 2023-01-11 ENCOUNTER — Ambulatory Visit: Payer: BC Managed Care – PPO | Admitting: Psychiatry

## 2023-01-13 ENCOUNTER — Ambulatory Visit: Payer: BC Managed Care – PPO | Admitting: Psychiatry

## 2023-03-05 ENCOUNTER — Telehealth: Payer: Self-pay | Admitting: Family Medicine

## 2023-03-05 ENCOUNTER — Encounter: Payer: BC Managed Care – PPO | Admitting: Family Medicine

## 2023-03-05 NOTE — Telephone Encounter (Signed)
Pt called at 2:45 to say she had eaten so she was cancelling her appt at 3:40. I told her she could come anyway and then get her labs another day. She said Ok she would come then called back and said she didn't want to die ion the way so she was going to cancel anyway. She is requesting her fee be waived.

## 2023-03-22 ENCOUNTER — Ambulatory Visit (INDEPENDENT_AMBULATORY_CARE_PROVIDER_SITE_OTHER): Payer: BC Managed Care – PPO | Admitting: Family Medicine

## 2023-03-22 ENCOUNTER — Encounter: Payer: Self-pay | Admitting: Family Medicine

## 2023-03-22 VITALS — BP 106/68 | HR 77 | Temp 98.4°F | Ht 62.0 in | Wt 149.4 lb

## 2023-03-22 DIAGNOSIS — R4589 Other symptoms and signs involving emotional state: Secondary | ICD-10-CM | POA: Diagnosis not present

## 2023-03-22 DIAGNOSIS — Z0000 Encounter for general adult medical examination without abnormal findings: Secondary | ICD-10-CM

## 2023-03-22 DIAGNOSIS — Z13220 Encounter for screening for lipoid disorders: Secondary | ICD-10-CM

## 2023-03-22 DIAGNOSIS — Z8632 Personal history of gestational diabetes: Secondary | ICD-10-CM | POA: Diagnosis not present

## 2023-03-22 LAB — URINALYSIS, ROUTINE W REFLEX MICROSCOPIC
Bilirubin Urine: NEGATIVE
Hgb urine dipstick: NEGATIVE
Ketones, ur: NEGATIVE
Leukocytes,Ua: NEGATIVE
Nitrite: NEGATIVE
Specific Gravity, Urine: 1.01 (ref 1.000–1.030)
Total Protein, Urine: NEGATIVE
Urine Glucose: NEGATIVE
Urobilinogen, UA: 0.2 (ref 0.0–1.0)
pH: 7 (ref 5.0–8.0)

## 2023-03-22 LAB — CBC WITH DIFFERENTIAL/PLATELET
Basophils Absolute: 0 10*3/uL (ref 0.0–0.1)
Basophils Relative: 0.3 % (ref 0.0–3.0)
Eosinophils Absolute: 0.1 10*3/uL (ref 0.0–0.7)
Eosinophils Relative: 1.1 % (ref 0.0–5.0)
HCT: 39.3 % (ref 36.0–46.0)
Hemoglobin: 12.9 g/dL (ref 12.0–15.0)
Lymphocytes Relative: 22.2 % (ref 12.0–46.0)
Lymphs Abs: 1.8 10*3/uL (ref 0.7–4.0)
MCHC: 33 g/dL (ref 30.0–36.0)
MCV: 90.9 fl (ref 78.0–100.0)
Monocytes Absolute: 0.5 10*3/uL (ref 0.1–1.0)
Monocytes Relative: 5.8 % (ref 3.0–12.0)
Neutro Abs: 5.6 10*3/uL (ref 1.4–7.7)
Neutrophils Relative %: 70.6 % (ref 43.0–77.0)
Platelets: 321 10*3/uL (ref 150.0–400.0)
RBC: 4.32 Mil/uL (ref 3.87–5.11)
RDW: 13.9 % (ref 11.5–15.5)
WBC: 8 10*3/uL (ref 4.0–10.5)

## 2023-03-22 LAB — COMPREHENSIVE METABOLIC PANEL
ALT: 10 U/L (ref 0–35)
AST: 13 U/L (ref 0–37)
Albumin: 4.7 g/dL (ref 3.5–5.2)
Alkaline Phosphatase: 39 U/L (ref 39–117)
BUN: 17 mg/dL (ref 6–23)
CO2: 24 mEq/L (ref 19–32)
Calcium: 9.8 mg/dL (ref 8.4–10.5)
Chloride: 102 mEq/L (ref 96–112)
Creatinine, Ser: 1.11 mg/dL (ref 0.40–1.20)
GFR: 58.11 mL/min — ABNORMAL LOW (ref >60.00)
Glucose, Bld: 88 mg/dL (ref 70–99)
Potassium: 4.1 mEq/L (ref 3.5–5.1)
Sodium: 136 mEq/L (ref 135–145)
Total Bilirubin: 0.4 mg/dL (ref 0.2–1.2)
Total Protein: 7.1 g/dL (ref 6.0–8.3)

## 2023-03-22 LAB — LIPID PANEL
Cholesterol: 266 mg/dL — ABNORMAL HIGH (ref 0–200)
HDL: 66.2 mg/dL (ref >39.00)
LDL Cholesterol: 179 mg/dL — ABNORMAL HIGH (ref 0–99)
NonHDL: 199.57
Total CHOL/HDL Ratio: 4
Triglycerides: 104 mg/dL (ref 0.0–149.0)
VLDL: 20.8 mg/dL (ref 0.0–40.0)

## 2023-03-22 LAB — HEMOGLOBIN A1C: Hgb A1c MFr Bld: 5.4 % (ref 4.6–6.5)

## 2023-03-22 NOTE — Progress Notes (Signed)
Established Patient Office Visit   Subjective:  Patient ID: [name], female    DOB: 1970/11/19  Age: 52 y.o. MRN: [id number]  Chief Complaint  Patient presents with   Annual Exam    CPE. Pt is fasting. Pt wants referral to Cardiologist due to family Hx of heart problem and her aunt just recently dying from a heart attack.     HPI Encounter Diagnoses  Name Primary?   Healthcare maintenance Yes   Screening for hyperlipidemia    Anxiety about health    History of gestational diabetes    For health check and fu of above. Relationship difficulty with husband. Not exercising. Weight gain. Aunt passed from CAD. She had seemed to be in excellent health.    Review of Systems  Constitutional: Negative.   HENT: Negative.    Eyes:  Negative for blurred vision, discharge and redness.  Respiratory: Negative.    Cardiovascular: Negative.   Gastrointestinal:  Negative for abdominal pain.  Genitourinary: Negative.   Musculoskeletal: Negative.  Negative for myalgias.  Skin:  Negative for rash.  Neurological:  Negative for tingling, loss of consciousness and weakness.  Endo/Heme/Allergies:  Negative for polydipsia.     Current Outpatient Medications:    LamoTRIgine (LAMICTAL XR) 300 MG TB24 24 hour tablet, Take 1 tablet (300 mg total) by mouth daily., Disp: 90 tablet, Rfl: 1   Lurasidone HCl (LATUDA) 60 MG TABS, Take 1 tablet (60 mg total) by mouth every evening., Disp: 90 tablet, Rfl: 1   OLANZapine (ZYPREXA) 10 MG tablet, TAKE 1.5 TO 2 TABLETS BY MOUTH AT BEDTIME, Disp: 180 tablet, Rfl: 1   celecoxib (CELEBREX) 200 MG capsule, Take 200 mg by mouth daily. (Patient not taking: Reported on 12/16/2022), Disp: , Rfl:    nicotine polacrilex (NICORETTE) 4 MG gum, Take 4 mg by mouth as needed for smoking cessation. (Patient not taking: Reported on 03/22/2023), Disp: , Rfl:    nicotine polacrilex (NICORETTE) 4 MG gum, Take 1 each (4 mg total) by mouth as needed for smoking cessation. (Patient  not taking: Reported on 03/22/2023), Disp: 100 tablet, Rfl: 1  Current Facility-Administered Medications:    0.9 %  sodium chloride infusion, 500 mL, Intravenous, Once, Armbruster, Willaim Rayas, MD   Objective:     BP 106/68   Pulse 77   Temp 98.4 F (36.9 C)   Ht 5\' 2"  (1.575 m)   Wt 149 lb 6.4 oz (67.8 kg)   SpO2 98%   BMI 27.33 kg/m  Wt Readings from Last 3 Encounters:  03/22/23 149 lb 6.4 oz (67.8 kg)  06/12/22 143 lb 3.2 oz (65 kg)  02/04/22 141 lb 9.6 oz (64.2 kg)      Physical Exam Constitutional:      General: She is not in acute distress.    Appearance: Normal appearance. She is not ill-appearing, toxic-appearing or diaphoretic.  HENT:     Head: Normocephalic and atraumatic.     Right Ear: Tympanic membrane, ear canal and external ear normal.     Left Ear: Tympanic membrane, ear canal and external ear normal.     Mouth/Throat:     Mouth: Mucous membranes are moist.     Pharynx: Oropharynx is clear. No oropharyngeal exudate or posterior oropharyngeal erythema.  Eyes:     General: No scleral icterus.       Right eye: No discharge.        Left eye: No discharge.     Extraocular Movements: Extraocular  movements intact.     Conjunctiva/sclera: Conjunctivae normal.     Pupils: Pupils are equal, round, and reactive to light.  Cardiovascular:     Rate and Rhythm: Normal rate and regular rhythm.  Pulmonary:     Effort: Pulmonary effort is normal. No respiratory distress.     Breath sounds: Normal breath sounds. No wheezing, rhonchi or rales.  Abdominal:     General: Bowel sounds are normal.     Tenderness: There is no abdominal tenderness. There is no guarding or rebound.  Musculoskeletal:     Cervical back: No rigidity or tenderness.  Lymphadenopathy:     Cervical: No cervical adenopathy.  Skin:    General: Skin is warm and dry.  Neurological:     Mental Status: She is alert and oriented to person, place, and time.  Psychiatric:        Mood and Affect: Mood  normal.        Behavior: Behavior normal.      No results found for any visits on 03/22/23.    The 10-year ASCVD risk score (Arnett DK, et al., 2019) is: 0.8%    Assessment & Plan:   Healthcare maintenance -     CBC with Differential/Platelet  Screening for hyperlipidemia -     Comprehensive metabolic panel -     Lipid panel  Anxiety about health -     Ambulatory referral to Cardiology  History of gestational diabetes -     Hemoglobin A1c -     Urinalysis, Routine w reflex microscopic    Return in about 6 months (around 09/22/2023).   Info given about exercising to lose weight. Info given about health maint and preventive health. Couples counseling when available.   Mliss Sax, MD

## 2023-03-23 NOTE — Telephone Encounter (Signed)
1st missed visit in more than 12 months, fee waived

## 2023-04-19 ENCOUNTER — Ambulatory Visit: Payer: BC Managed Care – PPO | Admitting: Psychiatry

## 2023-05-05 ENCOUNTER — Encounter: Payer: Self-pay | Admitting: Psychiatry

## 2023-05-05 ENCOUNTER — Ambulatory Visit: Payer: BC Managed Care – PPO | Admitting: Psychiatry

## 2023-05-05 VITALS — BP 107/73 | HR 63

## 2023-05-05 DIAGNOSIS — F515 Nightmare disorder: Secondary | ICD-10-CM

## 2023-05-05 DIAGNOSIS — F419 Anxiety disorder, unspecified: Secondary | ICD-10-CM | POA: Diagnosis not present

## 2023-05-05 DIAGNOSIS — F319 Bipolar disorder, unspecified: Secondary | ICD-10-CM | POA: Diagnosis not present

## 2023-05-05 MED ORDER — OLANZAPINE 10 MG PO TABS: ORAL_TABLET | ORAL | 1 refills | Status: DC

## 2023-05-05 MED ORDER — LURASIDONE HCL 60 MG PO TABS: 60.0000 mg | ORAL_TABLET | Freq: Every evening | ORAL | 1 refills | Status: DC

## 2023-05-05 MED ORDER — LAMOTRIGINE ER 300 MG PO TB24
300.0000 mg | ORAL_TABLET | Freq: Every day | ORAL | 1 refills | Status: DC
Start: 2023-05-05 — End: 2023-08-18

## 2023-05-05 MED ORDER — DOXAZOSIN MESYLATE 2 MG PO TABS: ORAL_TABLET | ORAL | 2 refills | Status: DC

## 2023-05-05 NOTE — Progress Notes (Unsigned)
[name] [id number] 1971/01/11 52 y.o.  Subjective:   Patient ID:  [name] is a 52 y.o. (DOB 08/27/1971) female.  Chief Complaint: No chief complaint on file.   HPI [name] presents to the office today for follow-up of ***  Oldest son died from overdose. His funeral was Friday. Intrusive images of son's death. She has started having some nightmares. Denies flashbacks. Denies exaggerated startle response. She reports panic attacks.   Denies difficulty falling asleep. Reports getting adequate sleep. Adequate energy and motivation. Denies manic symptoms. She reports that her appetite was decreased for a few days and now appetite has returned to baseline. She reports, "I'm going through the grieving the way I am supposed to." Denies SI.   Returned to work yesterday. 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 She reports that she has been leaning into her faith. She reports that husband has been doing ok.    Past Psychiatric Medication Trials: Lamictal- Has been helpful for mood. Reports that she has had fatigue with higher doses. Lithium- Wt gain Depakote Carbamazepine Topamax Seroquel Latuda- Has taken since 2010. She reports that this has been helpful for her mood. Has not been on doses higher than 40 mg. Olanzapine- Has been helpful for insomnia. Denies side effects. Has taken since 2000 Trilafon Effexor XR Prozac Celexa Buspar Ambien  Hydroxyzine  AIMS    Flowsheet Row Office Visit from 12/16/2022 in [facility] Office Visit from 06/01/2022 in [facility] Office Visit from 11/13/2021 in [facility] Office Visit from 06/02/2021 in [facility] Office Visit from 08/14/2020 in [facility]  AIMS Total Score 0 0 0 0 0      GAD-7    Flowsheet Row Office Visit from 03/22/2023  in [facility] [place] [facility] Visit from 02/04/2022 in [facility] [place] [facility] Visit from 01/30/2021 in [facility] [place] [facility] [employer]  Total GAD-7 Score 4 0 0      PHQ2-9    Flowsheet Row Office Visit from 03/22/2023 in [facility] Visit from 06/12/2022 in [facility] [place] [facility] [employer] Visit from 02/04/2022 in [facility] [employer] Visit from 11/24/2021 in [facility] [place] [facility] [employer] Visit from 01/30/2021 in [facility] [place] [facility] [employer]  PHQ-2 Total Score 0 0 0 0 0  PHQ-9 Total Score 1 -- 0 -- 0        Review of Systems:  Review of Systems  Musculoskeletal:  Negative for gait problem.  Neurological:  Negative for tremors.  Psychiatric/Behavioral:         Please refer to HPI    Medications: I have reviewed the patient's current medications.  Current Outpatient Medications  Medication Sig Dispense Refill   LamoTRIgine (LAMICTAL XR) 300 MG TB24 24 hour tablet Take 1 tablet (300 mg total) by mouth daily. 90 tablet 1   Lurasidone HCl (LATUDA) 60 MG TABS Take 1 tablet (60 mg total) by mouth every evening. 90 tablet 1   nicotine polacrilex (NICORETTE) 4 MG gum Take 4 mg by mouth as needed for smoking cessation. (Patient not taking: Reported on 03/22/2023)     nicotine polacrilex (NICORETTE) 4 MG gum Take 1 each (4 mg total) by mouth as needed  for smoking cessation. (Patient not taking: Reported on 03/22/2023) 100 tablet 1   OLANZapine (ZYPREXA) 10 MG tablet TAKE 1.5 TO 2 TABLETS BY MOUTH AT BEDTIME 180 tablet 1   Current Facility-Administered Medications  Medication Dose Route Frequency Provider Last Rate Last Admin   0.9 %  sodium chloride infusion  500 mL Intravenous Once Armbruster, Willaim Rayas, MD        Medication Side Effects:  None  Allergies: No Known Allergies  Past Medical History:  Diagnosis Date   Arthritis    osteoarthritis rt knee   Bipolar disorder (HCC)    Gestational diabetes    gestational diabetes 2009   HSV (herpes simplex virus) anogenital infection    Vaginal Pap smear, abnormal     Past Medical History, Surgical history, Social history, and Family history were reviewed and updated as appropriate.   Please see review of systems for further details on the patient's review from today.   Objective:   Physical Exam:  There were no vitals taken for this visit.  Physical Exam  Lab Review:     Component Value Date/Time   NA 136 03/22/2023 1347   K 4.1 03/22/2023 1347   CL 102 03/22/2023 1347   CO2 24 03/22/2023 1347   GLUCOSE 88 03/22/2023 1347   BUN 17 03/22/2023 1347   CREATININE 1.11 03/22/2023 1347   CALCIUM 9.8 03/22/2023 1347   PROT 7.1 03/22/2023 1347   ALBUMIN 4.7 03/22/2023 1347   AST 13 03/22/2023 1347   ALT 10 03/22/2023 1347   ALKPHOS 39 03/22/2023 1347   BILITOT 0.4 03/22/2023 1347   GFRNONAA >60 05/05/2017 1125   GFRAA >60 05/05/2017 1125       Component Value Date/Time   WBC 8.0 03/22/2023 1347   RBC 4.32 03/22/2023 1347   HGB 12.9 03/22/2023 1347   HCT 39.3 03/22/2023 1347   PLT 321.0 03/22/2023 1347   MCV 90.9 03/22/2023 1347   MCH 30.8 06/08/2019 1530   MCHC 33.0 03/22/2023 1347   RDW 13.9 03/22/2023 1347   LYMPHSABS 1.8 03/22/2023 1347   MONOABS 0.5 03/22/2023 1347   EOSABS 0.1 03/22/2023 1347   BASOSABS 0.0 03/22/2023 1347    No results found for: "POCLITH", "LITHIUM"   No results found for: "PHENYTOIN", "PHENOBARB", "VALPROATE", "CBMZ"   .res Assessment: Plan:    There are no diagnoses linked to this encounter.   Please see After Visit Summary for patient specific instructions.  Future Appointments  Date Time Provider Department Center  05/31/2023  9:00 AM Maisie Fus, MD [facility] None    No orders of the defined types were  placed in this encounter.   -------------------------------

## 2023-05-30 NOTE — Progress Notes (Unsigned)
Cardiology Office Note:    Date:  05/31/2023   ID:  [name], DOB [birth year], MRN [id number]  PCP:  Mliss Sax, MD   [facility] Providers Cardiologist:  Maisie Fus, MD     Referring MD: Mliss Sax   No chief complaint on file. "Anxiety about health"  History of Present Illness:    [name] is a 52 y.o. female with a hx of arthritis, bipolar disorder who has a referral because of "anxiety about health".     [name] presents for a preventative cardiac evaluation. She reports that her aunt had a massive heart attack at the age of 21 and died within five days, despite having a 'perfect bill of health.' Her grandfather also died suddenly on a tennis court at the age of 9. The patient is a former smoker, having quit in 2000, but continues to use nicotine gum. She was born with a heart murmur, but has had no other cardiac issues. She does not exercise regularly, but maintains a balanced diet. The patient recently experienced a significant personal loss, with the sudden death of her 58 year old son.       Past Medical History:  Diagnosis Date   Arthritis    osteoarthritis rt knee   Bipolar disorder (HCC)    Gestational diabetes    gestational diabetes 2009   HSV (herpes simplex virus) anogenital infection    Vaginal Pap smear, abnormal     Past Surgical History:  Procedure Laterality Date   ANKLE RECONSTRUCTION Bilateral    CESAREAN SECTION     CESAREAN SECTION N/A 06/28/2014   Procedure: CESAREAN SECTION;  Surgeon: Levi Aland, MD;  Location: [facility];  Service: Obstetrics;  Laterality: N/A;   CESAREAN SECTION     LEEP     MEDIAL PARTIAL KNEE REPLACEMENT Right 02/24/2021    Current Medications: Current Outpatient Medications on File Prior to Visit  Medication Sig Dispense Refill   doxazosin (CARDURA) 2 MG tablet Take 1/2 tablet at bedtime for 2-4 nights, then increase to 1 tablet at bedtime for 2-4 nights if  needed/tolerated, then may increase to 2 tablets at bedtime if needed/tolerated. 60 tablet 2   LamoTRIgine (LAMICTAL XR) 300 MG TB24 24 hour tablet Take 1 tablet (300 mg total) by mouth daily. 90 tablet 1   Lurasidone HCl (LATUDA) 60 MG TABS Take 1 tablet (60 mg total) by mouth every evening. 90 tablet 1   nicotine polacrilex (NICORETTE) 4 MG gum Take 4 mg by mouth as needed for smoking cessation.     OLANZapine (ZYPREXA) 10 MG tablet TAKE 1.5 TO 2 TABLETS BY MOUTH AT BEDTIME 180 tablet 1   Current Facility-Administered Medications on File Prior to Visit  Medication Dose Route Frequency Provider Last Rate Last Admin   0.9 %  sodium chloride infusion  500 mL Intravenous Once Armbruster, Willaim Rayas, MD         Allergies:   Patient has no known allergies.   Social History   Socioeconomic History   Marital status: Married    Spouse name: Not on file   Number of children: Not on file   Years of education: Not on file   Highest education level: Not on file  Occupational History    Employer: [employer]   Tobacco Use   Smoking status: Former    Current packs/day: 0.00    Types: Cigarettes    Quit date: 03/06/2019    Years  since quitting: 4.2   Smokeless tobacco: Never  Vaping Use   Vaping status: Never Used  Substance and Sexual Activity   Alcohol use: No    Comment: Last use 2019   Drug use: No   Sexual activity: Yes  Other Topics Concern   Not on file  Social History Narrative   Not on file   Social Determinants of Health   Financial Resource Strain: Not on file  Food Insecurity: Not on file  Transportation Needs: Not on file  Physical Activity: Not on file  Stress: Not on file  Social Connections: Not on file     Family History: The patient's family history includes ADD / ADHD in her son; Alcohol abuse in her father; Bipolar disorder in her mother; Colon polyps (age of onset: 10) in her father; Depression in her mother; Diabetes in her father; Diabetes  Mellitus II in her father; Heart disease in her maternal grandfather; Hypertension in her maternal grandfather; Irritable bowel syndrome in her mother; ODD in her son; Pancreatic cancer in her maternal grandmother; Personality disorder in her mother. There is no history of Colon cancer, Esophageal cancer, Stomach cancer, or Rectal cancer.  ROS:   Please see the history of present illness.     All other systems reviewed and are negative.  EKGs/Labs/Other Studies Reviewed:    The following studies were reviewed today: EKG Interpretation Date/Time:  Monday May 31 2023 09:17:44 EDT Ventricular Rate:  75 PR Interval:  126 QRS Duration:  74 QT Interval:  386 QTC Calculation: 431 R Axis:   40  Text Interpretation: Normal sinus rhythm Normal ECG No previous ECGs available Confirmed by Carolan Clines [phone]) on 05/31/2023 9:27:25 AM  EKG Interpretation Date/Time:  Monday May 31 2023 09:17:44 EDT Ventricular Rate:  75 PR Interval:  126 QRS Duration:  74 QT Interval:  386 QTC Calculation: 431 R Axis:   40  Text Interpretation: Normal sinus rhythm Normal ECG No previous ECGs available Confirmed by Carolan Clines ([id number]) on 05/31/2023 9:27:25 AM    Recent Labs: 03/22/2023: ALT 10; BUN 17; Creatinine, Ser 1.11; Hemoglobin 12.9; Platelets 321.0; Potassium 4.1; Sodium 136   Recent Lipid Panel    Component Value Date/Time   CHOL 266 (H) 03/22/2023 1347   TRIG 104.0 03/22/2023 1347   HDL 66.20 03/22/2023 1347   CHOLHDL 4 03/22/2023 1347   VLDL 20.8 03/22/2023 1347   LDLCALC 179 (H) 03/22/2023 1347   LDLDIRECT 136.9 05/12/2012 1654     Risk Assessment/Calculations:   The 10-year ASCVD risk score (Arnett DK, et al., 2019) is: 0.9%   Values used to calculate the score:     Age: 73 years     Sex: Female     Is Non-Hispanic African American: No     Diabetic: No     Tobacco smoker: No     Systolic Blood Pressure: 100 mmHg     Is BP treated: No     HDL Cholesterol: 66.2 mg/dL      Total Cholesterol: 266 mg/dL       Physical Exam:    VS:  Vitals:   05/31/23 0911  BP: 100/70  Pulse: 75  SpO2: 99%     Wt Readings from Last 3 Encounters:  05/31/23 142 lb (64.4 kg)  03/22/23 149 lb 6.4 oz (67.8 kg)  06/12/22 143 lb 3.2 oz (65 kg)     GEN:  Well nourished, well developed in no acute distress HEENT: Normal NECK: No lymphadenopathy LYMPHATICS:  No lymphadenopathy CARDIAC: RRR, no murmurs, rubs, gallops RESPIRATORY:  Clear to auscultation without rales, wheezing or rhonchi  ABDOMEN:non-distended MUSCULOSKELETAL:  No edema; No deformity  SKIN: Warm and dry NEUROLOGIC:  Alert and oriented x 3 PSYCHIATRIC:  Normal affect   ASSESSMENT:    PLAN:        Cardiovascular Risk Assessment Family history of premature cardiac death. Former smoker, quit in 13-Aug-1999. No personal history of diabetes, hypertension, or hyperlipidemia. Normal BMI. EKG normal. -Order coronary artery calcium score for further risk stratification.  Lifestyle Modification Sedentary lifestyle and suboptimal diet. -Encourage regular walking exercise and balanced meals.  Psychosocial Stress Recent loss of son, significant emotional stress. -Ensure adequate social support at home.      Medication Adjustments/Labs and Tests Ordered: Current medicines are reviewed at length with the patient today.  Concerns regarding medicines are outlined above.  Orders Placed This Encounter  Procedures   EKG 12-Lead   No orders of the defined types were placed in this encounter.   Patient Instructions  Medication Instructions:  Your physician recommends that you continue on your current medications as directed. Please refer to the Current Medication list given to you today.  *If you need a refill on your cardiac medications before your next appointment, please call your pharmacy*   Lab Work: None ordered  If you have labs (blood work) drawn today and your tests are completely normal, you will  receive your results only by: MyChart Message (if you have MyChart) OR A paper copy in the mail If you have any lab test that is abnormal or we need to change your treatment, we will call you to review the results.   Testing/Procedures: Your phyisician recommends that you have a Coronary Calcium Sore test done.   Follow-Up: At [facility], you and your health needs are our priority.  As part of our continuing mission to provide you with exceptional heart care, we have created designated Provider Care Teams.  These Care Teams include your primary Cardiologist (physician) and Advanced Practice Providers (APPs -  Physician Assistants and Nurse Practitioners) who all work together to provide you with the care you need, when you need it.  We recommend signing up for the patient portal called "MyChart".  Sign up information is provided on this After Visit Summary.  MyChart is used to connect with patients for Virtual Visits (Telemedicine).  Patients are able to view lab/test results, encounter notes, upcoming appointments, etc.  Non-urgent messages can be sent to your provider as well.   To learn more about what you can do with MyChart, go to [email or web address].    Your next appointment:   Based upon results   Provider:   Maisie Fus, MD     Other Instructions     Signed, Maisie Fus, MD  05/31/2023 9:37 AM    [facility]

## 2023-05-31 ENCOUNTER — Ambulatory Visit: Payer: BC Managed Care – PPO | Attending: Internal Medicine | Admitting: Internal Medicine

## 2023-05-31 ENCOUNTER — Encounter: Payer: Self-pay | Admitting: Internal Medicine

## 2023-05-31 VITALS — BP 100/70 | HR 75 | Ht 62.0 in | Wt 142.0 lb

## 2023-05-31 DIAGNOSIS — Z136 Encounter for screening for cardiovascular disorders: Secondary | ICD-10-CM

## 2023-05-31 NOTE — Patient Instructions (Signed)
Medication Instructions:  Your physician recommends that you continue on your current medications as directed. Please refer to the Current Medication list given to you today.  *If you need a refill on your cardiac medications before your next appointment, please call your pharmacy*   Lab Work: None ordered  If you have labs (blood work) drawn today and your tests are completely normal, you will receive your results only by: MyChart Message (if you have MyChart) OR A paper copy in the mail If you have any lab test that is abnormal or we need to change your treatment, we will call you to review the results.   Testing/Procedures: Your phyisician recommends that you have a Coronary Calcium Sore test done.   Follow-Up: At [facility], you and your health needs are our priority.  As part of our continuing mission to provide you with exceptional heart care, we have created designated Provider Care Teams.  These Care Teams include your primary Cardiologist (physician) and Advanced Practice Providers (APPs -  Physician Assistants and Nurse Practitioners) who all work together to provide you with the care you need, when you need it.  We recommend signing up for the patient portal called "MyChart".  Sign up information is provided on this After Visit Summary.  MyChart is used to connect with patients for Virtual Visits (Telemedicine).  Patients are able to view lab/test results, encounter notes, upcoming appointments, etc.  Non-urgent messages can be sent to your provider as well.   To learn more about what you can do with MyChart, go to [email or web address].    Your next appointment:   Based upon results   Provider:   Maisie Fus, MD     Other Instructions

## 2023-06-01 ENCOUNTER — Telehealth: Payer: Self-pay | Admitting: Psychiatry

## 2023-06-01 NOTE — Telephone Encounter (Signed)
Rx sent 9/4 for 90-day supply with 1 RF.

## 2023-06-01 NOTE — Telephone Encounter (Signed)
[name] called requesting a refill on the Latuda 60 mg when due. Patient rescheduled her appointment until 10/21.  Fill at [id number] [place] [place], [place] - [address] [name] [address] Phone: [phone]  Fax: [phone]

## 2023-06-02 ENCOUNTER — Ambulatory Visit: Payer: BC Managed Care – PPO | Admitting: Psychiatry

## 2023-06-07 ENCOUNTER — Ambulatory Visit (INDEPENDENT_AMBULATORY_CARE_PROVIDER_SITE_OTHER): Payer: BC Managed Care – PPO | Admitting: Family Medicine

## 2023-06-07 ENCOUNTER — Encounter: Payer: Self-pay | Admitting: Family Medicine

## 2023-06-07 VITALS — BP 116/78 | HR 83 | Temp 97.3°F | Ht 62.0 in | Wt 146.4 lb

## 2023-06-07 DIAGNOSIS — Z716 Tobacco abuse counseling: Secondary | ICD-10-CM

## 2023-06-07 DIAGNOSIS — E7800 Pure hypercholesterolemia, unspecified: Secondary | ICD-10-CM | POA: Diagnosis not present

## 2023-06-07 DIAGNOSIS — F4321 Adjustment disorder with depressed mood: Secondary | ICD-10-CM | POA: Diagnosis not present

## 2023-06-07 NOTE — Progress Notes (Signed)
Established Patient Office Visit   Subjective:  Patient ID: [name], female    DOB: 03-Apr-1971  Age: 52 y.o. MRN: [id number]  Chief Complaint  Patient presents with   Nicotine Dependence    Former smoker. Quit 2020. Using Nicorette.     Nicotine Dependence Her urge triggers include company of smokers.   Encounter Diagnoses  Name Primary?   Grieving Yes   Encounter for smoking cessation counseling    Elevated LDL cholesterol level    Here for documentation of smoking cessation.  She actually had quit smoking in July 2020.  She continues to use Nicorette gum as needed.  47 year old son who suffered from addiction passed from an overdose on 23 August.  She was there with him.  It is believed that cocaine laced with fentanyl was involved.  She continues counseling with psychiatry and her priest.   Review of Systems  Constitutional: Negative.   HENT: Negative.    Eyes:  Negative for blurred vision, discharge and redness.  Respiratory: Negative.    Cardiovascular: Negative.   Gastrointestinal:  Negative for abdominal pain.  Genitourinary: Negative.   Musculoskeletal: Negative.  Negative for myalgias.  Skin:  Negative for rash.  Neurological:  Negative for tingling, loss of consciousness and weakness.  Endo/Heme/Allergies:  Negative for polydipsia.     Current Outpatient Medications:    doxazosin (CARDURA) 2 MG tablet, Take 1/2 tablet at bedtime for 2-4 nights, then increase to 1 tablet at bedtime for 2-4 nights if needed/tolerated, then may increase to 2 tablets at bedtime if needed/tolerated., Disp: 60 tablet, Rfl: 2   LamoTRIgine (LAMICTAL XR) 300 MG TB24 24 hour tablet, Take 1 tablet (300 mg total) by mouth daily., Disp: 90 tablet, Rfl: 1   Lurasidone HCl (LATUDA) 60 MG TABS, Take 1 tablet (60 mg total) by mouth every evening., Disp: 90 tablet, Rfl: 1   nicotine polacrilex (NICORETTE) 4 MG gum, Take 4 mg by mouth as needed for smoking cessation., Disp: , Rfl:     OLANZapine (ZYPREXA) 10 MG tablet, TAKE 1.5 TO 2 TABLETS BY MOUTH AT BEDTIME, Disp: 180 tablet, Rfl: 1  Current Facility-Administered Medications:    0.9 %  sodium chloride infusion, 500 mL, Intravenous, Once, Armbruster, Willaim Rayas, MD   Objective:     BP 116/78   Pulse 83   Temp (!) 97.3 F (36.3 C)   Ht 5\' 2"  (1.575 m)   Wt 146 lb 6.4 oz (66.4 kg)   SpO2 99%   BMI 26.78 kg/m    Physical Exam Constitutional:      General: She is not in acute distress.    Appearance: Normal appearance. She is not ill-appearing, toxic-appearing or diaphoretic.  HENT:     Head: Normocephalic and atraumatic.     Right Ear: External ear normal.     Left Ear: External ear normal.  Eyes:     General: No scleral icterus.       Right eye: No discharge.        Left eye: No discharge.     Extraocular Movements: Extraocular movements intact.     Conjunctiva/sclera: Conjunctivae normal.  Pulmonary:     Effort: Pulmonary effort is normal. No respiratory distress.  Skin:    General: Skin is warm and dry.  Neurological:     Mental Status: She is alert and oriented to person, place, and time.  Psychiatric:        Mood and Affect: Mood normal.  Behavior: Behavior normal.      No results found for any visits on 06/07/23.    The 10-year ASCVD risk score (Arnett DK, et al., 2019) is: 1.2%    Assessment & Plan:   Grieving  Encounter for smoking cessation counseling  Elevated LDL cholesterol level    Return if symptoms worsen or fail to improve.  Has follow-up appointment in January.  Continue counseling as needed.  Information was given on preventing high cholesterol and the Mediterranean diet.  Mliss Sax, MD

## 2023-06-16 ENCOUNTER — Other Ambulatory Visit (HOSPITAL_BASED_OUTPATIENT_CLINIC_OR_DEPARTMENT_OTHER): Payer: BC Managed Care – PPO

## 2023-06-21 ENCOUNTER — Ambulatory Visit: Payer: BC Managed Care – PPO | Admitting: Psychiatry

## 2023-06-21 ENCOUNTER — Encounter: Payer: Self-pay | Admitting: Psychiatry

## 2023-06-21 DIAGNOSIS — F5102 Adjustment insomnia: Secondary | ICD-10-CM | POA: Diagnosis not present

## 2023-06-21 DIAGNOSIS — F3131 Bipolar disorder, current episode depressed, mild: Secondary | ICD-10-CM | POA: Diagnosis not present

## 2023-06-21 DIAGNOSIS — F419 Anxiety disorder, unspecified: Secondary | ICD-10-CM

## 2023-06-21 MED ORDER — TRAZODONE HCL 100 MG PO TABS
ORAL_TABLET | ORAL | 2 refills | Status: DC
Start: 2023-06-21 — End: 2023-08-18

## 2023-06-21 NOTE — Progress Notes (Signed)
[name] [id number] [birth year] 52 y.o.  Subjective:   Patient ID:  [name] is a 52 y.o. (DOB [birth year]) female.  Chief Complaint:  Chief Complaint  Patient presents with   Insomnia   Other    Grief    HPI [name] presents to the office today for follow-up of acute grief, Bipolar Disorder and anxiety. She reports, "It's getting worse... it's setting in more," in terms of starting to process the loss of her son. She reports that she has been having difficulty getting to sleep and has been awake until 2 am some nights. Estimates sleeping about 5.5-6.She has been having intrusive memories and flashbacks of son's death. She has been having some nightmares, "but not too bad." She has missed some work due to grief. She reports persistent sadness and some irritability. She reports that she is doing things on adrenaline. She reports that she has been doing what she needs to do at work. Appetite has been ok. She reports difficulty with concentration and feels "scattered." She reports episodic anxiety. She reports feeling "numb." She reports that she has been having increased panic attacks. Continues to do some things she enjoys. Denies SI.   She has been seeing Dr. Sherrine Maples weekly for therapy. Her youngest son is seeing a therapist for grief counseling. She has been sleeping with deceased son's stuffed animal.   Husband has been offered a full-time position with [employer].   She reports that they are receiving good support.   Past Psychiatric Medication Trials: Lamictal- Has been helpful for mood. Reports that she has had fatigue with higher doses. Lithium- Wt gain Depakote Carbamazepine Topamax Seroquel Latuda- Has taken since 2010. She reports that this has been helpful for her mood. Has not been on doses higher than 40 mg. Olanzapine- Has been helpful for insomnia. Denies side effects. Has taken since 2000 Trilafon Effexor XR Prozac Celexa Buspar Ambien   Hydroxyzine  AIMS    Flowsheet Row Office Visit from 06/21/2023 in [facility] Office Visit from 05/05/2023 in [facility] Office Visit from 12/16/2022 in [facility] Visit from 06/01/2022 in [facility] Office Visit from 11/13/2021 in [facility]  AIMS Total Score 0 0 0 0 0      GAD-7    Flowsheet Row Office Visit from 03/22/2023 in [facility] Visit from 02/04/2022 in [facility] Visit from 01/30/2021 in [facility] [place] [facility] [employer]  Total GAD-7 Score 4 0 0      PHQ2-9    Flowsheet Row Office Visit from 03/22/2023 in [facility] [place] [facility] Visit from 06/12/2022 in [facility] [place] [facility] [employer] Visit from 02/04/2022 in [facility] [employer] Visit from 11/24/2021 in [facility] [employer] Visit from 01/30/2021 in [facility] [place] [facility] [employer]  PHQ-2 Total Score 0 0 0 0 0  PHQ-9 Total Score 1 -- 0 -- 0        Review of Systems:  Review of Systems  HENT:  Positive for tinnitus.   Musculoskeletal:  Negative for gait problem.  Neurological:  Negative for tremors.  Psychiatric/Behavioral:         Please refer to HPI    Medications: I have reviewed the patient's current  medications.  Current Outpatient Medications  Medication Sig Dispense Refill   LamoTRIgine (LAMICTAL XR) 300 MG TB24 24 hour tablet Take 1 tablet (300 mg total) by mouth daily. 90 tablet 1   Lurasidone HCl (LATUDA) 60 MG TABS Take 1 tablet (60 mg total) by mouth every evening. 90 tablet 1   nicotine polacrilex (NICORETTE) 4 MG gum Take 4 mg by mouth as needed for smoking cessation.     OLANZapine (ZYPREXA) 10  MG tablet TAKE 1.5 TO 2 TABLETS BY MOUTH AT BEDTIME (Patient taking differently: Take 10 mg by mouth at bedtime. TAKE 1.5 TO 2 TABLETS BY MOUTH AT BEDTIME) 180 tablet 1   traZODone (DESYREL) 100 MG tablet Take 1/2-1 tablet po QHS prn insomnia 30 tablet 2   doxazosin (CARDURA) 2 MG tablet Take 1/2 tablet at bedtime for 2-4 nights, then increase to 1 tablet at bedtime for 2-4 nights if needed/tolerated, then may increase to 2 tablets at bedtime if needed/tolerated. (Patient not taking: Reported on 06/21/2023) 60 tablet 2   Current Facility-Administered Medications  Medication Dose Route Frequency Provider Last Rate Last Admin   0.9 %  sodium chloride infusion  500 mL Intravenous Once Armbruster, Willaim Rayas, MD        Medication Side Effects: None  Allergies: No Known Allergies  Past Medical History:  Diagnosis Date   Arthritis    osteoarthritis rt knee   Bipolar disorder (HCC)    Gestational diabetes    gestational diabetes 2009   HSV (herpes simplex virus) anogenital infection    Vaginal Pap smear, abnormal     Past Medical History, Surgical history, Social history, and Family history were reviewed and updated as appropriate.   Please see review of systems for further details on the patient's review from today.   Objective:   Physical Exam:  There were no vitals taken for this visit.  Physical Exam Constitutional:      General: She is not in acute distress. Musculoskeletal:        General: No deformity.  Neurological:     Mental Status: She is alert and oriented to person, place, and time.     Coordination: Coordination normal.  Psychiatric:        Attention and Perception: Attention and perception normal. She does not perceive auditory or visual hallucinations.        Mood and Affect: Mood is anxious. Affect is tearful. Affect is not labile, blunt, angry or inappropriate.        Speech: Speech normal.        Behavior: Behavior normal.        Thought Content: Thought  content normal. Thought content is not paranoid or delusional. Thought content does not include homicidal or suicidal ideation. Thought content does not include homicidal or suicidal plan.        Cognition and Memory: Cognition and memory normal.        Judgment: Judgment normal.     Comments: Insight intact Sad mood in response to the loss of her son     Lab Review:     Component Value Date/Time   NA 136 03/22/2023 1347   K 4.1 03/22/2023 1347   CL 102 03/22/2023 1347   CO2 24 03/22/2023 1347   GLUCOSE 88 03/22/2023 1347   BUN 17 03/22/2023 1347   CREATININE 1.11 03/22/2023 1347   CALCIUM 9.8 03/22/2023 1347   PROT 7.1 03/22/2023 1347   ALBUMIN 4.7 03/22/2023 1347   AST 13 03/22/2023  1347   ALT 10 03/22/2023 1347   ALKPHOS 39 03/22/2023 1347   BILITOT 0.4 03/22/2023 1347   GFRNONAA >60 05/05/2017 1125   GFRAA >60 05/05/2017 1125       Component Value Date/Time   WBC 8.0 03/22/2023 1347   RBC 4.32 03/22/2023 1347   HGB 12.9 03/22/2023 1347   HCT 39.3 03/22/2023 1347   PLT 321.0 03/22/2023 1347   MCV 90.9 03/22/2023 1347   MCH 30.8 06/08/2019 1530   MCHC 33.0 03/22/2023 1347   RDW 13.9 03/22/2023 1347   LYMPHSABS 1.8 03/22/2023 1347   MONOABS 0.5 03/22/2023 1347   EOSABS 0.1 03/22/2023 1347   BASOSABS 0.0 03/22/2023 1347    No results found for: "POCLITH", "LITHIUM"   No results found for: "PHENYTOIN", "PHENOBARB", "VALPROATE", "CBMZ"   .res Assessment: Plan:    31 minutes spent dedicated to the care of this patient on the date of this encounter to include pre-visit review of records, ordering of medication, post visit documentation, and face-to-face time with the patient discussing acute grief response and that initially she was in shock immediately after the loss of her son and that now she is starting to process the loss. Encouraged her to continue to see therapist weekly for grief counseling. Discussed treatment options with pt and she reports that she prefers  not to change her long-standing routine medications for bipolar disorder and anxiety since these medications have been effective and well tolerated. She reports that her chief complaint at this time is insomnia and she would like to take a medication to help with sleep. Discussed potential benefits, risks, and side effects of Trazodone. Pt agrees to trial of Trazodone. Will start Trazodone 100 mg 1/2-1 tablet at bedtime as needed for insomnia.  Continue Olanzapine 20 mg at bedtime for mood stabilization.  Continue Latuda 60 mg daily with supper for mood stabilization.  Continue Lamotrigine XR 300 mg daily for mood stabilization.  She reports that she has not taken Doxazosin since nightmares have been infrequent. Pt reports that her employer remains supportive and understanding regarding her grief and loss. Offered to assist with intermittent FMLA or accommodations if needed and encouraged her to contact office if this is something that she and/or her employer may need in the future.  Pt to follow-up with this provider in 2 weeks or sooner if clinically indicated.  Patient advised to contact office with any questions, adverse effects, or acute worsening in signs and symptoms.    [name] was seen today for insomnia and other.  Diagnoses and all orders for this visit:  Anxiety disorder, unspecified type  Adjustment insomnia -     traZODone (DESYREL) 100 MG tablet; Take 1/2-1 tablet po QHS prn insomnia  Bipolar affective disorder, currently depressed, mild (HCC)     Please see After Visit Summary for patient specific instructions.  Future Appointments  Date Time Provider Department Center  07/05/2023  9:00 AM [facility]-CT 2 [facility]  07/07/2023  9:30 AM Corie Chiquito, PMHNP CP-CP None    No orders of the defined types were placed in this encounter.   -------------------------------

## 2023-07-05 ENCOUNTER — Other Ambulatory Visit (HOSPITAL_COMMUNITY): Payer: BC Managed Care – PPO

## 2023-07-07 ENCOUNTER — Ambulatory Visit: Payer: BC Managed Care – PPO | Admitting: Psychiatry

## 2023-07-07 ENCOUNTER — Other Ambulatory Visit (HOSPITAL_COMMUNITY): Payer: BC Managed Care – PPO

## 2023-07-13 ENCOUNTER — Other Ambulatory Visit: Payer: Self-pay | Admitting: Psychiatry

## 2023-07-13 DIAGNOSIS — F5102 Adjustment insomnia: Secondary | ICD-10-CM

## 2023-07-14 ENCOUNTER — Encounter: Payer: Self-pay | Admitting: Psychiatry

## 2023-07-21 ENCOUNTER — Ambulatory Visit: Payer: BC Managed Care – PPO | Admitting: Psychiatry

## 2023-07-21 ENCOUNTER — Other Ambulatory Visit (HOSPITAL_COMMUNITY): Payer: BC Managed Care – PPO

## 2023-07-30 ENCOUNTER — Other Ambulatory Visit: Payer: Self-pay | Admitting: Psychiatry

## 2023-07-30 DIAGNOSIS — F515 Nightmare disorder: Secondary | ICD-10-CM

## 2023-08-01 NOTE — Telephone Encounter (Signed)
Last note patient reported not taking as NM are infrequent. Verify.

## 2023-08-18 ENCOUNTER — Ambulatory Visit: Payer: BC Managed Care – PPO | Admitting: Psychiatry

## 2023-08-18 ENCOUNTER — Encounter: Payer: Self-pay | Admitting: Psychiatry

## 2023-08-18 ENCOUNTER — Ambulatory Visit (HOSPITAL_COMMUNITY): Payer: BC Managed Care – PPO

## 2023-08-18 DIAGNOSIS — F419 Anxiety disorder, unspecified: Secondary | ICD-10-CM | POA: Diagnosis not present

## 2023-08-18 DIAGNOSIS — F319 Bipolar disorder, unspecified: Secondary | ICD-10-CM | POA: Diagnosis not present

## 2023-08-18 MED ORDER — LURASIDONE HCL 60 MG PO TABS: 60.0000 mg | ORAL_TABLET | Freq: Every evening | ORAL | 1 refills | Status: AC

## 2023-08-18 MED ORDER — LAMOTRIGINE ER 300 MG PO TB24
300.0000 mg | ORAL_TABLET | Freq: Every day | ORAL | 1 refills | Status: AC
Start: 2023-08-18 — End: ?

## 2023-08-18 MED ORDER — OLANZAPINE 10 MG PO TABS: ORAL_TABLET | ORAL | 1 refills | Status: AC

## 2023-08-18 MED ORDER — HYDROXYZINE HCL 50 MG PO TABS
50.0000 mg | ORAL_TABLET | Freq: Four times a day (QID) | ORAL | 1 refills | Status: DC | PRN
Start: 2023-08-18 — End: 2023-09-02

## 2023-08-18 NOTE — Progress Notes (Signed)
[name] [id number] 1970/12/25 52 y.o.  Subjective:   Patient ID:  [name] is a 52 y.o. (DOB 10-29-1970) female.  Chief Complaint:  Chief Complaint  Patient presents with   Other    Grief   Follow-up    Bipolar Disorder   Anxiety    HPI [name] presents to the office today for follow-up of Bipolar Disorder, anxiety, and grief after the loss of her oldest son. She reports that she has been, "going, going, going." She has been doing holiday baking and completing grades. She notices some irritability and reports that this may be in response to "house being a mess." She reports that she typically cleans obsessively. She denies euphoric mood, impulsivity, or risky activity. She reports some increased goal-directed activity.   She reports, "I'm so sad" and continues to grieve the loss of her son. Her priest came and blessed their home. She has not been able to clean up son's room. She reports, "I keep seeing him" and having some flashbacks and intrusive memories. Denies nightmares. Denies hypervigilance. She reports having to compulsively say good bye to her sons. She reports some worry. Denies panic. Denies any significant physical symptoms with anxiety. Has been deep breathing, taking warm baths, etc. She has long-standing anxiety with highway driving. She reports that she cried often with friends during recent weekend trip. She reports decreased sleep this past weekend with trip to [facility]. She reports that she took one Olanzapine while traveling so she would not be as drowsy. She reports, "I slept a lot last night" and got 9.5 hours of sleep. Appetite has been ok. Eating more sweets. Denies SI.   She reports that she felt groggy and in a fog all day after taking Trazodone.   She reports difficulty keeping up with dates and the days.   Mother is visiting from [facility]. Husband is working 12-hour shifts for [employer].   She has been seeing Earley Favor, PhD weekly.   Past  Psychiatric Medication Trials: Lamictal- Has been helpful for mood. Reports that she has had fatigue with higher doses. Lithium- Wt gain Depakote Carbamazepine Topamax Seroquel Latuda- Has taken since 2010. She reports that this has been helpful for her mood. Has not been on doses higher than 40 mg. Olanzapine- Has been helpful for insomnia. Denies side effects. Has taken since 2000 Trilafon Effexor XR Prozac Celexa Buspar Ambien  Hydroxyzine Trazodone- caused excessive grogginess  AIMS    Flowsheet Row Office Visit from 08/18/2023 in [facility] Office Visit from 06/21/2023 in [facility] Office Visit from 05/05/2023 in [facility] Office Visit from 12/16/2022 in [facility] Office Visit from 06/01/2022 in [facility]  AIMS Total Score 0 0 0 0 0      GAD-7    Flowsheet Row Office Visit from 03/22/2023 in [facility] Visit from 02/04/2022 in [facility] Visit from 01/30/2021 in [facility] [employer]  Total GAD-7 Score 4 0 0      PHQ2-9    Flowsheet Row Office Visit from 03/22/2023 in [facility] Visit from 06/12/2022 in [facility] [place] [facility] [employer] Visit from 02/04/2022 in [facility] [employer] Visit from 11/24/2021 in [facility] Visit  from 01/30/2021 in [facility] Total Score 0 0 0 0 0  PHQ-9 Total Score 1 -- 0 -- 0        Review of Systems:  Review of Systems  Medications: I have reviewed the patient's current medications.  Current Outpatient Medications  Medication Sig Dispense Refill   hydrOXYzine (ATARAX) 50  MG tablet Take 1 tablet (50 mg total) by mouth every 6 (six) hours as needed. For anxiety 30 tablet 1   LamoTRIgine (LAMICTAL XR) 300 MG TB24 24 hour tablet Take 1 tablet (300 mg total) by mouth daily. 90 tablet 1   Lurasidone HCl (LATUDA) 60 MG TABS Take 1 tablet (60 mg total) by mouth every evening. 90 tablet 1   nicotine polacrilex (NICORETTE) 4 MG gum Take 4 mg by mouth as needed for smoking cessation.     OLANZapine (ZYPREXA) 10 MG tablet TAKE 1.5 TO 2 TABLETS BY MOUTH AT BEDTIME 180 tablet 1   Current Facility-Administered Medications  Medication Dose Route Frequency Provider Last Rate Last Admin   0.9 %  sodium chloride infusion  500 mL Intravenous Once Armbruster, Willaim Rayas, MD        Medication Side Effects: None  Allergies: No Known Allergies  Past Medical History:  Diagnosis Date   Arthritis    osteoarthritis rt knee   Bipolar disorder (HCC)    Gestational diabetes    gestational diabetes 2009   HSV (herpes simplex virus) anogenital infection    Vaginal Pap smear, abnormal     Past Medical History, Surgical history, Social history, and Family history were reviewed and updated as appropriate.   Please see review of systems for further details on the patient's review from today.   Objective:   Physical Exam:  There were no vitals taken for this visit.  Physical Exam Constitutional:      General: She is not in acute distress. Musculoskeletal:        General: No deformity.  Neurological:     Mental Status: She is alert and oriented to person, place, and time.     Coordination: Coordination normal.  Psychiatric:        Attention and Perception: Attention and perception normal. She does not perceive auditory or visual hallucinations.        Mood and Affect: Affect is not labile, blunt, angry or inappropriate.        Speech: Speech normal.        Behavior: Behavior normal.        Thought Content: Thought content normal. Thought content is not paranoid or delusional.  Thought content does not include homicidal or suicidal ideation. Thought content does not include homicidal or suicidal plan.        Cognition and Memory: Cognition and memory normal.        Judgment: Judgment normal.     Comments: Insight intact Mood is appropriate to content and affect is congruent- tearful when talking about the loss of her son     Lab Review:     Component Value Date/Time   NA 136 03/22/2023 1347   K 4.1 03/22/2023 1347   CL 102 03/22/2023 1347   CO2 24 03/22/2023 1347   GLUCOSE 88 03/22/2023 1347   BUN 17 03/22/2023 1347   CREATININE 1.11 03/22/2023 1347   CALCIUM 9.8 03/22/2023 1347   PROT 7.1 03/22/2023 1347   ALBUMIN 4.7 03/22/2023 1347   AST 13 03/22/2023 1347   ALT  10 03/22/2023 1347   ALKPHOS 39 03/22/2023 1347   BILITOT 0.4 03/22/2023 1347   GFRNONAA >60 05/05/2017 1125   GFRAA >60 05/05/2017 1125       Component Value Date/Time   WBC 8.0 03/22/2023 1347   RBC 4.32 03/22/2023 1347   HGB 12.9 03/22/2023 1347   HCT 39.3 03/22/2023 1347   PLT 321.0 03/22/2023 1347   MCV 90.9 03/22/2023 1347   MCH 30.8 06/08/2019 1530   MCHC 33.0 03/22/2023 1347   RDW 13.9 03/22/2023 1347   LYMPHSABS 1.8 03/22/2023 1347   MONOABS 0.5 03/22/2023 1347   EOSABS 0.1 03/22/2023 1347   BASOSABS 0.0 03/22/2023 1347    No results found for: "POCLITH", "LITHIUM"   No results found for: "PHENYTOIN", "PHENOBARB", "VALPROATE", "CBMZ"   .res Assessment: Plan:    31 minutes spent dedicated to the care of this patient on the date of this encounter to include pre-visit review of records, ordering of medication, post visit documentation, and face-to-face time with the patient discussing mood and anxiety symptoms as she continues to grieve the loss of her son. She reports that she has periods of increased anxiety and asks about re-trial of Hydroxyzine. Hydroxyzine pamoate (vistaril) 25 mg was ineffective and pt was prescribed Hydroxyzine HCL (Atarax) 50 mg as needed for a  longer period of time in the past. Will therefore re-start Hydroxyzine 50 mg every 6 hours as needed for anxiety.  Continue Lamotrigine XR 300 mg daily for mood stabilization.  Continue Latuda 60 mg daily with supper for mood stabilization.  Continue Olanzapine 10 mg 1.5-2 tablets at bedtime for mood stabilization. Discussed that resuming two tablets nightly after her trip will likely help stabilize mood and improve sleep.  Discussed transfer of care since provider will be leaving practice. Pt reports that she would like to continue care with this provider if possible. Discussed that she also has option to transfer care to another provider at [facility].  Patient advised to contact office with any questions, adverse effects, or acute worsening in signs and symptoms. Recommend continuing psychotherapy.     Mykala was seen today for other, follow-up and anxiety.  Diagnoses and all orders for this visit:  Anxiety disorder, unspecified type -     hydrOXYzine (ATARAX) 50 MG tablet; Take 1 tablet (50 mg total) by mouth every 6 (six) hours as needed. For anxiety  Bipolar 1 disorder (HCC) -     LamoTRIgine (LAMICTAL XR) 300 MG TB24 24 hour tablet; Take 1 tablet (300 mg total) by mouth daily. -     Lurasidone HCl (LATUDA) 60 MG TABS; Take 1 tablet (60 mg total) by mouth every evening. -     OLANZapine (ZYPREXA) 10 MG tablet; TAKE 1.5 TO 2 TABLETS BY MOUTH AT BEDTIME     Please see After Visit Summary for patient specific instructions.  No future appointments.  No orders of the defined types were placed in this encounter.   -------------------------------

## 2023-09-01 ENCOUNTER — Other Ambulatory Visit: Payer: Self-pay | Admitting: Psychiatry

## 2023-09-01 DIAGNOSIS — F419 Anxiety disorder, unspecified: Secondary | ICD-10-CM

## 2024-03-30 ENCOUNTER — Encounter: Payer: Self-pay | Admitting: Family Medicine

## 2024-03-30 ENCOUNTER — Ambulatory Visit (INDEPENDENT_AMBULATORY_CARE_PROVIDER_SITE_OTHER): Payer: Self-pay | Admitting: Family Medicine

## 2024-03-30 VITALS — BP 116/72 | HR 69 | Temp 98.0°F | Ht 62.0 in | Wt 146.4 lb

## 2024-03-30 DIAGNOSIS — N1831 Chronic kidney disease, stage 3a: Secondary | ICD-10-CM

## 2024-03-30 DIAGNOSIS — Z720 Tobacco use: Secondary | ICD-10-CM

## 2024-03-30 DIAGNOSIS — E7800 Pure hypercholesterolemia, unspecified: Secondary | ICD-10-CM | POA: Diagnosis not present

## 2024-03-30 DIAGNOSIS — Z8632 Personal history of gestational diabetes: Secondary | ICD-10-CM | POA: Diagnosis not present

## 2024-03-30 DIAGNOSIS — Z0000 Encounter for general adult medical examination without abnormal findings: Secondary | ICD-10-CM

## 2024-03-30 DIAGNOSIS — N29 Other disorders of kidney and ureter in diseases classified elsewhere: Secondary | ICD-10-CM

## 2024-03-30 DIAGNOSIS — F4321 Adjustment disorder with depressed mood: Secondary | ICD-10-CM

## 2024-03-30 LAB — COMPREHENSIVE METABOLIC PANEL WITH GFR
ALT: 11 U/L (ref 0–35)
AST: 14 U/L (ref 0–37)
Albumin: 4.5 g/dL (ref 3.5–5.2)
Alkaline Phosphatase: 44 U/L (ref 39–117)
BUN: 14 mg/dL (ref 6–23)
CO2: 27 meq/L (ref 19–32)
Calcium: 9.6 mg/dL (ref 8.4–10.5)
Chloride: 104 meq/L (ref 96–112)
Creatinine, Ser: 1.24 mg/dL — ABNORMAL HIGH (ref 0.40–1.20)
GFR: 50.51 mL/min — ABNORMAL LOW (ref >60.00)
Glucose, Bld: 102 mg/dL — ABNORMAL HIGH (ref 70–99)
Potassium: 3.9 meq/L (ref 3.5–5.1)
Sodium: 138 meq/L (ref 135–145)
Total Bilirubin: 0.3 mg/dL (ref 0.2–1.2)
Total Protein: 7.2 g/dL (ref 6.0–8.3)

## 2024-03-30 LAB — URINALYSIS, ROUTINE W REFLEX MICROSCOPIC
Bilirubin Urine: NEGATIVE
Hgb urine dipstick: NEGATIVE
Ketones, ur: NEGATIVE
Leukocytes,Ua: NEGATIVE
Nitrite: NEGATIVE
Specific Gravity, Urine: 1.01 (ref 1.000–1.030)
Total Protein, Urine: NEGATIVE
Urine Glucose: NEGATIVE
Urobilinogen, UA: 0.2 (ref 0.0–1.0)
pH: 7 (ref 5.0–8.0)

## 2024-03-30 LAB — HEMOGLOBIN A1C: Hgb A1c MFr Bld: 5.7 % (ref 4.6–6.5)

## 2024-03-30 LAB — CBC WITH DIFFERENTIAL/PLATELET
Basophils Absolute: 0.1 K/uL (ref 0.0–0.1)
Basophils Relative: 0.9 % (ref 0.0–3.0)
Eosinophils Absolute: 0.1 K/uL (ref 0.0–0.7)
Eosinophils Relative: 1.7 % (ref 0.0–5.0)
HCT: 39.2 % (ref 36.0–46.0)
Hemoglobin: 13 g/dL (ref 12.0–15.0)
Lymphocytes Relative: 24.7 % (ref 12.0–46.0)
Lymphs Abs: 1.6 K/uL (ref 0.7–4.0)
MCHC: 33.1 g/dL (ref 30.0–36.0)
MCV: 89.3 fl (ref 78.0–100.0)
Monocytes Absolute: 0.4 K/uL (ref 0.1–1.0)
Monocytes Relative: 6.3 % (ref 3.0–12.0)
Neutro Abs: 4.3 K/uL (ref 1.4–7.7)
Neutrophils Relative %: 66.4 % (ref 43.0–77.0)
Platelets: 351 K/uL (ref 150.0–400.0)
RBC: 4.39 Mil/uL (ref 3.87–5.11)
RDW: 13.6 % (ref 11.5–15.5)
WBC: 6.4 K/uL (ref 4.0–10.5)

## 2024-03-30 LAB — LIPID PANEL
Cholesterol: 245 mg/dL — ABNORMAL HIGH (ref 0–200)
HDL: 75.9 mg/dL (ref >39.00)
LDL Cholesterol: 156 mg/dL — ABNORMAL HIGH (ref 0–99)
NonHDL: 169.21
Total CHOL/HDL Ratio: 3
Triglycerides: 64 mg/dL (ref 0.0–149.0)
VLDL: 12.8 mg/dL (ref 0.0–40.0)

## 2024-03-30 NOTE — Progress Notes (Addendum)
 Established Patient Office Visit   Subjective:  Patient ID: [name], female    DOB: 12-Jan-1971  Age: 53 y.o. MRN: [id number]  Chief Complaint  Patient presents with   Annual Exam    Pt fasting     HPI Encounter Diagnoses  Name Primary?   Healthcare maintenance Yes   Elevated LDL cholesterol level    History of gestational diabetes    Nicotine  use    Grieving    Stage 3a chronic kidney disease (HCC)    Nephrocalcinosis    For physical and follow-up of above.  Obviously this has been a tough year for her.  August 23 will be the first year anniversary of her son's death.  She and her husband continue couples counseling.  She continues follow-up with psychiatry.  Her children are in counseling as well.  She has leaned heavily on her faith.  She has been able to work through all of this time.  She is not exercising regularly.  She does have regular dental care.  She is up-to-date on healthcare.  Mammogram with Pap is scheduled on August 7.  She continues nicotine  gum for smoking cessation.   Review of Systems  Constitutional: Negative.   HENT: Negative.    Eyes:  Negative for blurred vision, discharge and redness.  Respiratory: Negative.    Cardiovascular: Negative.   Gastrointestinal:  Negative for abdominal pain.  Genitourinary: Negative.   Musculoskeletal: Negative.  Negative for myalgias.  Skin:  Negative for rash.  Neurological:  Negative for tingling, loss of consciousness and weakness.  Endo/Heme/Allergies:  Negative for polydipsia.     Current Outpatient Medications:    busPIRone (BUSPAR) 15 MG tablet, Take 15 mg by mouth 2 (two) times daily., Disp: , Rfl:    hydrOXYzine  (ATARAX ) 50 MG tablet, TAKE 1 TABLET (50 MG TOTAL) BY MOUTH EVERY 6 (SIX) HOURS AS NEEDED. FOR ANXIETY, Disp: 90 tablet, Rfl: 0   LamoTRIgine  (LAMICTAL  XR) 300 MG TB24 24 hour tablet, Take 1 tablet (300 mg total) by mouth daily., Disp: 90 tablet, Rfl: 1   Lurasidone  HCl (LATUDA ) 60 MG TABS,  Take 1 tablet (60 mg total) by mouth every evening., Disp: 90 tablet, Rfl: 1   nicotine  polacrilex (NICORETTE ) 4 MG gum, Take 4 mg by mouth as needed for smoking cessation., Disp: , Rfl:    OLANZapine  (ZYPREXA ) 10 MG tablet, TAKE 1.5 TO 2 TABLETS BY MOUTH AT BEDTIME, Disp: 180 tablet, Rfl: 1  Current Facility-Administered Medications:    0.9 %  sodium chloride  infusion, 500 mL, Intravenous, Once, Armbruster, Elspeth SQUIBB, MD   Objective:     BP 116/72 (BP Location: Right Arm, Patient Position: Sitting)   Pulse 69   Temp 98 F (36.7 C) (Temporal)   Ht 5' 2 (1.575 m)   Wt 146 lb 6.4 oz (66.4 kg)   LMP 03/20/2024   SpO2 98%   BMI 26.78 kg/m  Wt Readings from Last 3 Encounters:  03/30/24 146 lb 6.4 oz (66.4 kg)  06/07/23 146 lb 6.4 oz (66.4 kg)  05/31/23 142 lb (64.4 kg)      Physical Exam Constitutional:      General: She is not in acute distress.    Appearance: Normal appearance. She is not ill-appearing, toxic-appearing or diaphoretic.  HENT:     Head: Normocephalic and atraumatic.     Right Ear: Tympanic membrane, ear canal and external ear normal.     Left Ear: Tympanic membrane, ear canal and external  ear normal.     Mouth/Throat:     Mouth: Mucous membranes are moist.     Pharynx: Oropharynx is clear. No oropharyngeal exudate or posterior oropharyngeal erythema.  Eyes:     General: No scleral icterus.       Right eye: No discharge.        Left eye: No discharge.     Extraocular Movements: Extraocular movements intact.     Conjunctiva/sclera: Conjunctivae normal.     Pupils: Pupils are equal, round, and reactive to light.  Cardiovascular:     Rate and Rhythm: Normal rate and regular rhythm.  Pulmonary:     Effort: Pulmonary effort is normal. No respiratory distress.     Breath sounds: Normal breath sounds. No wheezing or rales.  Abdominal:     General: Bowel sounds are normal.     Tenderness: There is no right CVA tenderness or left CVA tenderness.   Musculoskeletal:     Cervical back: No rigidity or tenderness.  Skin:    General: Skin is warm and dry.  Neurological:     Mental Status: She is alert and oriented to person, place, and time.  Psychiatric:        Mood and Affect: Mood normal.        Behavior: Behavior normal.      Results for orders placed or performed in visit on 03/30/24  CBC with Differential/Platelet  Result Value Ref Range   WBC 6.4 4.0 - 10.5 K/uL   RBC 4.39 3.87 - 5.11 Mil/uL   Hemoglobin 13.0 12.0 - 15.0 g/dL   HCT 60.7 63.9 - 53.9 %   MCV 89.3 78.0 - 100.0 fl   MCHC 33.1 30.0 - 36.0 g/dL   RDW 86.3 88.4 - 84.4 %   Platelets 351.0 150.0 - 400.0 K/uL   Neutrophils Relative % 66.4 43.0 - 77.0 %   Lymphocytes Relative 24.7 12.0 - 46.0 %   Monocytes Relative 6.3 3.0 - 12.0 %   Eosinophils Relative 1.7 0.0 - 5.0 %   Basophils Relative 0.9 0.0 - 3.0 %   Neutro Abs 4.3 1.4 - 7.7 K/uL   Lymphs Abs 1.6 0.7 - 4.0 K/uL   Monocytes Absolute 0.4 0.1 - 1.0 K/uL   Eosinophils Absolute 0.1 0.0 - 0.7 K/uL   Basophils Absolute 0.1 0.0 - 0.1 K/uL  Comprehensive metabolic panel with GFR  Result Value Ref Range   Sodium 138 135 - 145 mEq/L   Potassium 3.9 3.5 - 5.1 mEq/L   Chloride 104 96 - 112 mEq/L   CO2 27 19 - 32 mEq/L   Glucose, Bld 102 (H) 70 - 99 mg/dL   BUN 14 6 - 23 mg/dL   Creatinine, Ser 8.75 (H) 0.40 - 1.20 mg/dL   Total Bilirubin 0.3 0.2 - 1.2 mg/dL   Alkaline Phosphatase 44 39 - 117 U/L   AST 14 0 - 37 U/L   ALT 11 0 - 35 U/L   Total Protein 7.2 6.0 - 8.3 g/dL   Albumin 4.5 3.5 - 5.2 g/dL   GFR 49.48 (L) >39.99 mL/min   Calcium 9.6 8.4 - 10.5 mg/dL  Lipid panel  Result Value Ref Range   Cholesterol 245 (H) 0 - 200 mg/dL   Triglycerides 35.9 0.0 - 149.0 mg/dL   HDL 24.09 >60.99 mg/dL   VLDL 87.1 0.0 - 59.9 mg/dL   LDL Cholesterol 843 (H) 0 - 99 mg/dL   Total CHOL/HDL Ratio 3    NonHDL 169.21  Urinalysis, Routine w reflex microscopic  Result Value Ref Range   Color, Urine YELLOW  Yellow;Lt. Yellow;Straw;Dark Yellow;Amber;Green;Red;Brown   APPearance CLEAR Clear;Turbid;Slightly Cloudy;Cloudy   Specific Gravity, Urine 1.010 1.000 - 1.030   pH 7.0 5.0 - 8.0   Total Protein, Urine NEGATIVE Negative   Urine Glucose NEGATIVE Negative   Ketones, ur NEGATIVE Negative   Bilirubin Urine NEGATIVE Negative   Hgb urine dipstick NEGATIVE Negative   Urobilinogen, UA 0.2 0.0 - 1.0   Leukocytes,Ua NEGATIVE Negative   Nitrite NEGATIVE Negative   WBC, UA 0-2/hpf 0-2/hpf   RBC / HPF 0-2/hpf 0-2/hpf   Squamous Epithelial / HPF Rare(0-4/hpf) Rare(0-4/hpf)  Hemoglobin A1c  Result Value Ref Range   Hgb A1c MFr Bld 5.7 4.6 - 6.5 %      The 10-year ASCVD risk score (Arnett DK, et al., 2019) is: 1%    Assessment & Plan:   Healthcare maintenance -     CBC with Differential/Platelet -     Urinalysis, Routine w reflex microscopic  Elevated LDL cholesterol level -     Comprehensive metabolic panel with GFR -     Lipid panel  History of gestational diabetes -     Comprehensive metabolic panel with GFR -     Hemoglobin A1c  Nicotine  use  Grieving  Stage 3a chronic kidney disease (HCC) -     Ambulatory referral to Nephrology  Nephrocalcinosis    Return in about 1 year (around 03/30/2025).  Continue nicotine  gum for smoking cessation.  Encouraged regular exercise for 30 minutes daily for improved mental and physical health.  Information was given on exercising to stay healthy.  Continued counseling and follow-up with psychiatry.  Information was given on health maintenance and disease prevention.  Encouraged Shingrix vaccine.  She will research it and make a decision afterwards.  Elsie Sim Lent, MD  8/7 addendum: History of nephrocalcinosis with chronic renal insufficiency.  Nephrology referral.

## 2024-03-31 ENCOUNTER — Ambulatory Visit: Payer: Self-pay | Admitting: Family Medicine

## 2024-04-06 ENCOUNTER — Encounter: Payer: Self-pay | Admitting: Family Medicine

## 2024-04-06 DIAGNOSIS — N1831 Chronic kidney disease, stage 3a: Secondary | ICD-10-CM | POA: Insufficient documentation

## 2024-04-06 NOTE — Addendum Note (Signed)
 Addended by: BERNETA ELSIE LABOR on: 04/06/2024 12:50 PM   Modules accepted: Orders

## 2024-06-05 ENCOUNTER — Telehealth: Payer: Self-pay

## 2024-06-05 NOTE — Telephone Encounter (Signed)
 Copied from CRM [id number]. Topic: Referral - Request for Referral >> Jun 05, 2024  1:24 PM Shereese L wrote: Did the patient discuss referral with their provider in the last year? No (If No - schedule appointment) (If Yes - send message)  Appointment offered? NO  Type of order/referral and detailed reason for visit: Lung cancer screening  Preference of office, provider, location: NO  If referral order, have you been seen by this specialty before? No (If Yes, this issue or another issue? When? Where?  Can we respond through MyChart? Yes

## 2024-06-19 ENCOUNTER — Telehealth: Admitting: Family Medicine

## 2024-06-19 ENCOUNTER — Encounter: Payer: Self-pay | Admitting: Family Medicine

## 2024-06-19 VITALS — Ht 62.0 in | Wt 150.0 lb

## 2024-06-19 DIAGNOSIS — Z87891 Personal history of nicotine dependence: Secondary | ICD-10-CM | POA: Diagnosis not present

## 2024-06-19 DIAGNOSIS — N29 Other disorders of kidney and ureter in diseases classified elsewhere: Secondary | ICD-10-CM

## 2024-06-19 DIAGNOSIS — E7800 Pure hypercholesterolemia, unspecified: Secondary | ICD-10-CM | POA: Diagnosis not present

## 2024-06-19 DIAGNOSIS — Z720 Tobacco use: Secondary | ICD-10-CM

## 2024-06-19 NOTE — Progress Notes (Signed)
 Established Patient Office Visit   Subjective:  Patient ID: [name], female    DOB: Dec 22, 1970  Age: 53 y.o. MRN: [id number]  Chief Complaint  Patient presents with   Lab review    Pt wants to discuss labs.   Pt requesting referral for Lung Cancer Screening.     HPI Encounter Diagnoses  Name Primary?   Elevated LDL cholesterol level Yes   Nephrocalcinosis    Nicotine  use    History of tobacco use    For follow-up of above.  History of elevated LDL at 156 with an HDL of 75.  Low ASCVD 10-year risk score.  Father developed heart disease in his 52s.  He has a brother with heart disease as well.  Patient smoked 2 packs/day but quit in 2020.  She continues to use nicotine  gum for continued tobacco cessation assistance.  Yearly follow-up with nephrology for CKD with nephrocalcinosis.   Review of Systems  Constitutional: Negative.   HENT: Negative.    Eyes:  Negative for blurred vision, discharge and redness.  Respiratory: Negative.    Cardiovascular: Negative.   Gastrointestinal:  Negative for abdominal pain.  Genitourinary: Negative.   Musculoskeletal: Negative.  Negative for myalgias.  Skin:  Negative for rash.  Neurological:  Negative for tingling, loss of consciousness and weakness.  Endo/Heme/Allergies:  Negative for polydipsia.     Current Outpatient Medications:    busPIRone (BUSPAR) 15 MG tablet, Take 15 mg by mouth 2 (two) times daily., Disp: , Rfl:    hydrOXYzine  (ATARAX ) 50 MG tablet, TAKE 1 TABLET (50 MG TOTAL) BY MOUTH EVERY 6 (SIX) HOURS AS NEEDED. FOR ANXIETY, Disp: 90 tablet, Rfl: 0   LamoTRIgine  (LAMICTAL  XR) 300 MG TB24 24 hour tablet, Take 1 tablet (300 mg total) by mouth daily., Disp: 90 tablet, Rfl: 1   Lurasidone  HCl (LATUDA ) 60 MG TABS, Take 1 tablet (60 mg total) by mouth every evening., Disp: 90 tablet, Rfl: 1   nicotine  polacrilex (NICORETTE ) 4 MG gum, Take 4 mg by mouth as needed for smoking cessation., Disp: , Rfl:    OLANZapine  (ZYPREXA ) 10  MG tablet, TAKE 1.5 TO 2 TABLETS BY MOUTH AT BEDTIME, Disp: 180 tablet, Rfl: 1  Current Facility-Administered Medications:    0.9 %  sodium chloride  infusion, 500 mL, Intravenous, Once, Armbruster, Elspeth SQUIBB, MD   Objective:     Ht 5' 2 (1.575 m)   Wt 150 lb (68 kg)   BMI 27.44 kg/m    Physical Exam Constitutional:      General: She is not in acute distress.    Appearance: Normal appearance. She is not ill-appearing, toxic-appearing or diaphoretic.  HENT:     Head: Normocephalic and atraumatic.     Right Ear: External ear normal.     Left Ear: External ear normal.  Eyes:     General: No scleral icterus.       Right eye: No discharge.        Left eye: No discharge.     Extraocular Movements: Extraocular movements intact.     Conjunctiva/sclera: Conjunctivae normal.  Pulmonary:     Effort: Pulmonary effort is normal. No respiratory distress.  Skin:    General: Skin is warm and dry.  Neurological:     Mental Status: She is alert and oriented to person, place, and time.  Psychiatric:        Mood and Affect: Mood normal.        Behavior: Behavior normal.  No results found for any visits on 06/19/24.    The 10-year ASCVD risk score (Arnett DK, et al., 2019) is: 1%    Assessment & Plan:   Elevated LDL cholesterol level -     CT CARDIAC SCORING (SELF PAY ONLY); Future  Nephrocalcinosis  Nicotine  use  History of tobacco use -     Ambulatory Referral for Lung Cancer Scre    Return in about 9 months (around 03/19/2025) for annual physical.  Continue nicotine  gum as needed.  Advised regular exercise and weight loss for the prevention of progression of prediabetes.  Elsie Sim Lent, MD  Virtual Visit via Video Note  I connected with Bailyn G Lunden on 06/19/24 at 11:00 AM EDT by a video enabled telemedicine application and verified that I am speaking with the correct person using two identifiers.  Location: Patient: at work in her office alone.   Provider: work   I discussed the limitations of evaluation and management by telemedicine and the availability of in person appointments. The patient expressed understanding and agreed to proceed.  History of Present Illness:    Observations/Objective:   Assessment and Plan:   Follow Up Instructions:    I discussed the assessment and treatment plan with the patient. The patient was provided an opportunity to ask questions and all were answered. The patient agreed with the plan and demonstrated an understanding of the instructions.   The patient was advised to call back or seek an in-person evaluation if the symptoms worsen or if the condition fails to improve as anticipated.  I provided 20 minutes of non-face-to-face time during this encounter.   Elsie Sim Lent, MD

## 2024-06-26 ENCOUNTER — Encounter (HOSPITAL_BASED_OUTPATIENT_CLINIC_OR_DEPARTMENT_OTHER): Payer: Self-pay | Admitting: Radiology

## 2024-06-26 ENCOUNTER — Ambulatory Visit (HOSPITAL_BASED_OUTPATIENT_CLINIC_OR_DEPARTMENT_OTHER)
Admission: RE | Admit: 2024-06-26 | Discharge: 2024-06-26 | Disposition: A | Discharge status: Home / Self Care | Payer: Self-pay | Source: Ambulatory Visit | Attending: Family Medicine | Admitting: Family Medicine

## 2024-06-26 DIAGNOSIS — E7800 Pure hypercholesterolemia, unspecified: Secondary | ICD-10-CM | POA: Insufficient documentation

## 2024-06-29 ENCOUNTER — Ambulatory Visit: Payer: Self-pay | Admitting: Family Medicine

## 2024-07-13 ENCOUNTER — Ambulatory Visit: Admitting: Nurse Practitioner

## 2024-07-13 ENCOUNTER — Encounter: Payer: Self-pay | Admitting: Nurse Practitioner

## 2024-07-13 ENCOUNTER — Ambulatory Visit: Payer: Self-pay

## 2024-07-13 VITALS — BP 122/70 | HR 93 | Temp 98.1°F | Ht 62.0 in | Wt 142.2 lb

## 2024-07-13 DIAGNOSIS — K529 Noninfective gastroenteritis and colitis, unspecified: Secondary | ICD-10-CM

## 2024-07-13 MED ORDER — ONDANSETRON HCL 4 MG PO TABS: 4.0000 mg | ORAL_TABLET | Freq: Three times a day (TID) | ORAL | 0 refills | Status: AC | PRN

## 2024-07-13 NOTE — Telephone Encounter (Signed)
 FYI Only or Action Required?: FYI only for provider: appointment scheduled on 07/13/2024.  Patient was last seen in primary care on 06/19/2024 by Berneta Elsie Sayre, MD.  Called Nurse Triage reporting Fever, Diarrhea, Fatigue, and Sinusitis.  Symptoms began several days ago.  Interventions attempted: Rest, hydration, or home remedies.  Symptoms are: unchanged.  Triage Disposition: See Physician Within 24 Hours  Patient/caregiver understands and will follow disposition?: Yes  Copied from CRM [id number]. Topic: Clinical - Red Word Triage >> Jul 13, 2024 10:20 AM Macario HERO wrote: Red Word that prompted transfer to Nurse Triage: Patient called said she's been experiencing COVID like symptoms since Saturday night, no appetite, weak, no smell or taste, chills/sweats and diarrhea. Reason for Disposition  [1] MODERATE diarrhea (e.g., 4-6 times / day more than normal) AND [2] present > 48 hours (2 days)  Answer Assessment - Initial Assessment Questions 1. DIARRHEA SEVERITY: How bad is the diarrhea? How many more stools have you had in the past 24 hours than normal?      five 2. ONSET: When did the diarrhea begin?      Saturday- five days ago 3. STOOL DESCRIPTION:  How loose or watery is the diarrhea? What is the stool color? Is there any blood or mucous in the stool?     Watery, yellow 4. VOMITING: Are you also vomiting? If Yes, ask: How many times in the past 24 hours?      denies 5. ABDOMEN PAIN: Are you having any abdomen pain? If Yes, ask: What does it feel like? (e.g., crampy, dull, intermittent, constant)      denies 6. ABDOMEN PAIN SEVERITY: If present, ask: How bad is the pain?  (e.g., Scale 1-10; mild, moderate, or severe)     N/a 7. ORAL INTAKE: If vomiting, Have you been able to drink liquids? How much liquids have you had in the past 24 hours?     Still able to drink liquids, no solid 8. HYDRATION: Any signs of dehydration? (e.g., dry mouth [not just  dry lips], too weak to stand, dizziness, new weight loss) When did you last urinate?     Mild dizziness 9. EXPOSURE: Have you traveled to a foreign country recently? Have you been exposed to anyone with diarrhea? Could you have eaten any food that was spoiled?     Last week, child had hand foot and mouth, friends and husband have been sick 10. ANTIBIOTIC USE: Are you taking antibiotics now or have you taken antibiotics in the past 2 months?       denies 11. OTHER SYMPTOMS: Do you have any other symptoms? (e.g., fever, blood in stool)       Fever x 3 days, 101 Tmax, sinus pain and congestion, unable to smell or taste  Protocols used: Diarrhea-A-AH

## 2024-07-13 NOTE — Telephone Encounter (Signed)
 Noted patient scheduled to see Roselie Mood, NP today at 11:20 AM

## 2024-07-13 NOTE — Patient Instructions (Addendum)
 Use zofran  for nausea Use imodium OVER THE COUNTER for >2episodes of diarrhea.  Diarrhea, Adult Diarrhea is when you pass loose and sometimes watery poop (stool) often. Diarrhea can make you feel weak and cause you to lose water in your body (get dehydrated). Losing water in your body can cause you to: Feel tired and thirsty. Have a dry mouth. Go pee (urinate) less often. Diarrhea often lasts 2-3 days. It can last longer if it is a sign of something more serious. Be sure to treat your diarrhea as told by your doctor. Follow these instructions at home: Eating and drinking     Follow these instructions as told by your doctor: Take an ORS (oral rehydration solution). This is a drink that helps you replace fluids and minerals your body lost. It is sold at pharmacies and stores. Drink enough fluid to keep your pee (urine) pale yellow. Drink fluids such as: Water. You can also get fluids by sucking on ice chips. Diluted fruit juice. Low-calorie sports drinks. Milk. Avoid drinking fluids that have a lot of sugar or caffeine in them. These include soda, energy drinks, and regular sports drinks. Avoid alcohol. Eat bland, easy-to-digest foods in small amounts as you are able. These foods include: Bananas. Applesauce. Rice. Low-fat (lean) meats. Toast. Crackers. Avoid spicy or fatty foods.  Medicines Take over-the-counter and prescription medicines only as told by your doctor. If you were prescribed antibiotics, take them as told by your doctor. Do not stop taking them even if you start to feel better. General instructions  Wash your hands often using soap and water for 20 seconds. If soap and water are not available, use hand sanitizer. Others in your home should wash their hands as well. Wash your hands: After using the toilet or changing a diaper. Before preparing, cooking, or serving food. While caring for a sick person. While visiting someone in a hospital. Rest at home while you  get better. Take a warm bath to help with any burning or pain from having diarrhea. Watch your condition for any changes. Contact a doctor if: You have a fever. Your diarrhea gets worse. You have new symptoms. You vomit every time you eat or drink. You feel light-headed, dizzy, or you have a headache. You have muscle cramps. You have signs of losing too much water in your body, such as: Dark pee, very little pee, or no pee. Cracked lips. Dry mouth. Sunken eyes. Sleepiness. Weakness. You have bloody or black poop or poop that looks like tar. You have very bad pain, cramping, or bloating in your belly (abdomen). Your skin feels cold and clammy. You feel confused. Get help right away if: You have chest pain. Your heart is beating very quickly. You have trouble breathing or you are breathing very quickly. You feel very weak or you faint. These symptoms may be an emergency. Get help right away. Call 911. Do not wait to see if the symptoms will go away. Do not drive yourself to the hospital. This information is not intended to replace advice given to you by your health care provider. Make sure you discuss any questions you have with your health care provider. Document Revised: 02/03/2022 Document Reviewed: 02/03/2022 Elsevier Patient Education  2024 [employer]  Viral Gastroenteritis, Adult  Viral gastroenteritis is also known as the stomach flu. This condition may affect your stomach, your small intestine, and your large intestine. It can cause sudden watery poop (diarrhea), fever, and vomiting. This condition is caused by certain  germs (viruses). These germs can be passed from person to person very easily (are contagious). Having watery poop and vomiting can make you feel weak and cause you to not have enough water in your body (get dehydrated). This can make you tired and thirsty, make you have a dry mouth, and make it so you pee (urinate) less often. It is important to replace  the fluids that you lose from having watery poop and vomiting. What are the causes? You can get sick by catching germs from other people. You can also get sick by: Eating food, drinking water, or touching a surface that has the germs on it (is contaminated). Sharing utensils or other personal items with a person who is sick. What increases the risk? Having a weak body defense system (immune system). Living with one or more children who are younger than 2 years. Living in a nursing home. Going on cruise ships. What are the signs or symptoms? Symptoms of this condition start suddenly. Symptoms may last for a few days or for as long as a week. Common symptoms include: Watery poop. Vomiting. Other symptoms include: Fever. Headache. Feeling tired (fatigue). Pain in the belly (abdomen). Chills. Feeling weak. Feeling like you may vomit (nauseous). Muscle aches. Not feeling hungry. How is this treated? This condition typically goes away on its own. The focus of treatment is to replace the fluids that you lose. This condition may be treated with: An ORS (oral rehydration solution). This is a drink that helps you replace fluids and minerals your body lost. It is sold at pharmacies and stores. Medicines to help with your symptoms. Probiotic supplements to reduce symptoms of watery poop. Fluids given through an IV tube, if needed. Older adults and people with other diseases or a weak body defense system are at higher risk for not having enough water in the body. Follow these instructions at home: Eating and drinking  Take an ORS as told by your doctor. Drink clear fluids in small amounts as you are able. Clear fluids include: Water. Ice chips. Fruit juice that has water added to it (is diluted). Low-calorie sports drinks. Drink enough fluid to keep your pee (urine) pale yellow. Eat small amounts of healthy foods every 3-4 hours as you are able. This may include whole grains, fruits,  vegetables, lean meats, and yogurt. Avoid fluids that have a lot of sugar or caffeine in them. This includes energy drinks, sports drinks, and soda. Avoid spicy or fatty foods. Avoid alcohol. General instructions  Wash your hands often. This is very important after you have watery poop or you vomit. If you cannot use soap and water, use hand sanitizer. Make sure that all people in your home wash their hands well and often. Take over-the-counter and prescription medicines only as told by your doctor. Rest at home while you get better. Watch your condition for any changes. Take a warm bath to help with any burning or pain from having watery poop. Keep all follow-up visits. Contact a doctor if: You cannot keep fluids down. Your symptoms get worse. You have new symptoms. You feel light-headed or dizzy. You have muscle cramps. Get help right away if: You have chest pain. You have trouble breathing, or you are breathing very fast. You have a fast heartbeat. You feel very weak or you faint. You have a very bad headache, a stiff neck, or both. You have a rash. You have very bad pain, cramping, or bloating in your belly. Your skin feels  cold and clammy. You feel mixed up (confused). You have pain when you pee. You have signs of not having enough water in the body, such as: Dark pee, hardly any pee, or no pee. Cracked lips. Dry mouth. Sunken eyes. Feeling very sleepy. Feeling weak. You have signs of bleeding, such as: You see blood in your vomit. Your vomit looks like coffee grounds. You have bloody or black poop or poop that looks like tar. These symptoms may be an emergency. Get help right away. Call 911. Do not wait to see if the symptoms will go away. Do not drive yourself to the hospital. Summary Viral gastroenteritis is also known as the stomach flu. This condition can cause sudden watery poop (diarrhea), fever, and vomiting. These germs can be passed from person to person  very easily. Take an ORS (oral rehydration solution) as told by your doctor. This is a drink that is sold at pharmacies and stores. Wash your hands often, especially after having watery poop or vomiting. If you cannot use soap and water, use hand sanitizer. This information is not intended to replace advice given to you by your health care provider. Make sure you discuss any questions you have with your health care provider. Document Revised: 06/16/2021 Document Reviewed: 06/16/2021 Elsevier Patient Education  2024 [employer].

## 2024-07-13 NOTE — Progress Notes (Signed)
 Established Patient Visit  Patient: [name]   DOB: [birth year]   53 y.o. Female  MRN: [id number] Visit Date: 07/13/2024  Subjective:    Chief Complaint  Patient presents with   Diarrhea    Started Saturday night fever of 101, fatigue, nausea, loss of appetite loose watery    Diarrhea  This is a new problem. The current episode started in the past 7 days. The problem occurs 2 to 4 times per day. The problem has been unchanged. The stool consistency is described as Watery. The patient states that diarrhea does not awaken her from sleep. Associated symptoms include bloating and a fever. Pertinent negatives include no abdominal pain, arthralgias, chills, coughing, headaches, increased  flatus, myalgias, sweats, URI, vomiting or weight loss. Nothing aggravates the symptoms. Risk factors include ill contacts. She has tried nothing for the symptoms. There is no history of bowel resection, inflammatory bowel disease, irritable bowel syndrome, malabsorption, a recent abdominal surgery or short gut syndrome.   Reviewed medical, surgical, and social history today  Medications: Outpatient Medications Prior to Visit  Medication Sig   busPIRone (BUSPAR) 15 MG tablet Take 15 mg by mouth 2 (two) times daily.   hydrOXYzine  (ATARAX ) 50 MG tablet TAKE 1 TABLET (50 MG TOTAL) BY MOUTH EVERY 6 (SIX) HOURS AS NEEDED. FOR ANXIETY   LamoTRIgine  (LAMICTAL  XR) 300 MG TB24 24 hour tablet Take 1 tablet (300 mg total) by mouth daily.   Lurasidone  HCl (LATUDA ) 60 MG TABS Take 1 tablet (60 mg total) by mouth every evening.   nicotine  polacrilex (NICORETTE ) 4 MG gum Take 4 mg by mouth as needed for smoking cessation.   OLANZapine  (ZYPREXA ) 10 MG tablet TAKE 1.5 TO 2 TABLETS BY MOUTH AT BEDTIME   Facility-Administered Medications Prior to Visit  Medication Dose Route Frequency Provider   0.9 %  sodium chloride  infusion  500 mL Intravenous Once Armbruster, Elspeth SQUIBB, MD   Reviewed past medical and  social history.   ROS per HPI above      Objective:  BP 122/70 (BP Location: Left Arm, Patient Position: Sitting, Cuff Size: Large)   Pulse 93   Temp 98.1 F (36.7 C) (Oral)   Ht 5' 2 (1.575 m)   Wt 142 lb 3.2 oz (64.5 kg)   LMP 07/07/2024   SpO2 99%   BMI 26.01 kg/m      Physical Exam Vitals and nursing note reviewed.  Cardiovascular:     Rate and Rhythm: Normal rate.     Pulses: Normal pulses.  Pulmonary:     Effort: Pulmonary effort is normal.     Breath sounds: Normal breath sounds.  Abdominal:     General: Bowel sounds are normal. There is no distension.     Palpations: Abdomen is soft.     Tenderness: There is no abdominal tenderness. There is no guarding.  Musculoskeletal:     Right lower leg: No edema.     Left lower leg: No edema.  Skin:    Findings: No rash.  Neurological:     Mental Status: She is alert and oriented to person, place, and time.     No results found for any visits on 07/13/24.    Assessment & Plan:    Problem List Items Addressed This Visit   None Visit Diagnoses       Gastroenteritis    -  Primary   Relevant Medications  ondansetron  (ZOFRAN ) 4 MG tablet   Other Relevant Orders   GI Profile, Stool, PCR     Encouraged to maintain adequate oral hydration and brat diet, Use zofran  for nausea, and Use imodium OVER THE COUNTER for >2episodes of diarrhea.  Return if symptoms worsen or fail to improve.     Roselie Mood, NP

## 2024-07-17 ENCOUNTER — Ambulatory Visit: Payer: Self-pay | Admitting: Nurse Practitioner

## 2024-07-17 ENCOUNTER — Ambulatory Visit: Payer: Self-pay

## 2024-07-17 LAB — GI PROFILE, STOOL, PCR

## 2024-07-17 NOTE — Telephone Encounter (Signed)
 FYI Only or Action Required?: FYI only for provider: ED advised but patient refused and wanted an office visit with PCP--[facility] advised they would call patient back for further assistance.  Patient was last seen in primary care on 07/13/2024 by Nche, Roselie Rockford, NP.  Called Nurse Triage reporting Diarrhea.  Symptoms began a week ago.  Interventions attempted: Rest, hydration, or home remedies.  Symptoms are: gradually worsening.  Triage Disposition: Go to ED Now (or PCP Triage)  Patient/caregiver understands and will follow disposition?: No, wishes to speak with PCP                Copied from CRM #[id number]. Topic: Clinical - Red Word Triage >> Jul 17, 2024  2:26 PM Viola FALCON wrote: Red Word that prompted transfer to Nurse Triage: Patient was seen 07/13/24, she has no appetite, has a lot of pressure in her head and vision feels like she's outside of her body Reason for Disposition  Patient sounds very sick or weak to the triager  Answer Assessment - Initial Assessment Questions Sick since last Saturday night per patient Dropped off a stool sample Friday Unable to eat still Patient states that she feels worse than she did and everything she has tried to eat goes straight through her system Patient states she doesn't feel stable enough to even drive at this time Patient is advised that the ER is recommended at this time for her symptoms and patient states that she cannot go to the ER and she wants an appointment at her PCP office Called [facility] to advise them of patient's ER Refusal and patient wanting an office appointment Patient had disconnected while this RN was on the phone with [facility] and called another Triage RN--patient advised that the PCP office would call her back [facility] advised that they would call her back at this time  Protocols used: [facility]

## 2024-07-17 NOTE — Telephone Encounter (Signed)
 Tried to call patient twice to offer next day appt at 9:20am with [facility].  NO answer

## 2024-07-18 ENCOUNTER — Ambulatory Visit: Admitting: Nurse Practitioner

## 2024-07-18 ENCOUNTER — Encounter: Payer: Self-pay | Admitting: Nurse Practitioner

## 2024-07-18 VITALS — BP 128/70 | HR 100 | Temp 98.4°F | Ht 62.0 in | Wt 143.2 lb

## 2024-07-18 DIAGNOSIS — R198 Other specified symptoms and signs involving the digestive system and abdomen: Secondary | ICD-10-CM

## 2024-07-18 LAB — COMPREHENSIVE METABOLIC PANEL WITH GFR
ALT: 11 U/L (ref 0–35)
AST: 15 U/L (ref 0–37)
Albumin: 4.6 g/dL (ref 3.5–5.2)
Alkaline Phosphatase: 39 U/L (ref 39–117)
BUN: 15 mg/dL (ref 6–23)
CO2: 25 meq/L (ref 19–32)
Calcium: 9.7 mg/dL (ref 8.4–10.5)
Chloride: 105 meq/L (ref 96–112)
Creatinine, Ser: 1.35 mg/dL — ABNORMAL HIGH (ref 0.40–1.20)
GFR: 45.52 mL/min — ABNORMAL LOW (ref >60.00)
Glucose, Bld: 113 mg/dL — ABNORMAL HIGH (ref 70–99)
Potassium: 4.3 meq/L (ref 3.5–5.1)
Sodium: 139 meq/L (ref 135–145)
Total Bilirubin: 0.3 mg/dL (ref 0.2–1.2)
Total Protein: 7.4 g/dL (ref 6.0–8.3)

## 2024-07-18 LAB — CBC
HCT: 39.2 % (ref 36.0–46.0)
Hemoglobin: 13.3 g/dL (ref 12.0–15.0)
MCHC: 33.8 g/dL (ref 30.0–36.0)
MCV: 88 fl (ref 78.0–100.0)
Platelets: 390 K/uL (ref 150.0–400.0)
RBC: 4.45 Mil/uL (ref 3.87–5.11)
RDW: 13.5 % (ref 11.5–15.5)
WBC: 6.7 K/uL (ref 4.0–10.5)

## 2024-07-18 LAB — LIPASE: Lipase: 31 U/L (ref 11.0–59.0)

## 2024-07-18 NOTE — Patient Instructions (Signed)
 Go to lab for blood draw Maintain a brat diet Start probiotics (align or curturelle) 1cap daily F/up with your pcp if symptoms persistent  Bland Diet A bland diet may consist of soft foods or foods that are not high in fat or are not greasy, acidic, or spicy. Avoiding certain foods may cause less irritation to your mouth, throat, stomach, or gastrointestinal tract. Avoiding certain foods may make you feel better. Everyone's tolerances are different. A bland diet should be based on what you can tolerate and what may cause discomfort. What is my plan? Your health care provider or dietitian may recommend specific changes to your diet to treat your symptoms. These changes may include: Eating small meals frequently. Cooking food until it is soft enough to chew easily. Taking the time to chew your food thoroughly, so it is easy to swallow and digest. Avoiding foods that cause you discomfort. These may include spicy food, fried food, greasy foods, hard-to-chew foods, or citrus fruits and juices. Drinking slowly. What are tips for following this plan? Reading food labels To reduce fiber intake, look for food labels that say whole, such as whole wheat or whole grain. Shopping Avoid food items that may have nuts or seeds. Avoid vegetables that may make you gassy or have a tough texture, such as broccoli, cauliflower, or corn. Cooking Cook foods thoroughly so they have a soft texture. Meal planning Make sure you include foods from all food groups to eat a balanced diet. Eat a variety of types of foods. Eat foods and drink beverages that do not cause you discomfort. These may include soups and broths with cooked meats, pasta, and vegetables. Lifestyle Sit up after meals, avoid tight clothing, and take time to eat and chew your food slowly. Ask your health care provider whether you should take dietary supplements. General information Mildly season your foods. Some seasonings, such as cayenne  pepper, vinegar, or hot sauce, may cause irritation. The foods, beverages, or seasonings to avoid should be based on individual tolerance. What foods should I eat? Fruits Canned or cooked fruit such as peaches, pears, or applesauce. Bananas. Vegetables Well-cooked vegetables. Canned or cooked vegetables such as carrots, green beans, beets, or spinach. Mashed or boiled potatoes. Grains  Hot cereals, such as cream of wheat and processed oatmeal. Rice. Bread, crackers, pasta, or tortillas made from refined white flour. Meats and other proteins  Eggs. Creamy peanut butter or other nut butters. Lean, well-cooked tender meats, such as beef, pork, chicken, or fish. Dairy Low-fat dairy products such as milk, cottage cheese, or yogurt. Beverages  Water. Herbal tea. Apple juice. Fats and oils Mild salad dressings. Canola or olive oil. Sweets and desserts Low-fat pudding, custard, or ice cream. Fruit gelatin. The items listed above may not be a complete list of foods and beverages you can eat. Contact a dietitian for more information. What foods should I avoid? Fruits Citrus fruits, such as oranges and grapefruit. Fruits with a stringy texture. Fruits that have lots of seeds, such as kiwi or strawberries. Dried fruits. Vegetables Raw, uncooked vegetables. Salads. Grains Whole grain breads, muffins, and cereals. Meats and other proteins Tough, fibrous meats. Highly seasoned meat such as corned beef, smoked meats, or fish. Processed high-fat meats such as brats, hot dogs, or sausage. Dairy Full-fat dairy foods such as ice cream and cheese. Beverages Caffeinated drinks. Alcohol. Seasonings and condiments Strongly flavored seasonings or condiments. Hot sauce. Salsa. Other foods Spicy foods. Fried or greasy foods. Sour foods, such as pickled  or fermented foods like sauerkraut. Foods high in fiber. The items listed above may not be a complete list of foods and beverages you should avoid.  Contact a dietitian for more information. Summary A bland diet should be based on individual tolerance. It may consist of foods that are soft textured and do not have a lot of fat, fiber, acid, or seasonings. A bland diet may be recommended because avoiding certain foods, beverages, or spices may make you feel better. This information is not intended to replace advice given to you by your health care provider. Make sure you discuss any questions you have with your health care provider. Document Revised: 07/07/2021 Document Reviewed: 07/07/2021 Elsevier Patient Education  2024 [employer].

## 2024-07-18 NOTE — Progress Notes (Signed)
 Acute Office Visit  Subjective:    Patient ID: [name], female    DOB: 1971/01/02, 53 y.o.   MRN: [id number]  Chief Complaint  Patient presents with   Follow-up    Still experiencing loose/diarrhea like stools, clammy skin, abdominal pain and headache, loss of appetite    HPI Patient is in today persistent ABDOMEN cramps and intermittent clammy sensation x 12days, resolved diarrhea but now has soft stool post food intake (1x/day). No blood in stool, no undigested food. No mucus in stool. No nausea or vomiting. No recent travel or known sick contact. Previous stool collected was not acceptable because container was overfilled. Last colonoscopy 2022: polyps removed (pathology-sessile serrated-precancerous), no diverticulosis noted. Advised to repeat in 2027.  Outpatient Medications Prior to Visit  Medication Sig   busPIRone (BUSPAR) 15 MG tablet Take 15 mg by mouth 2 (two) times daily.   hydrOXYzine  (ATARAX ) 50 MG tablet TAKE 1 TABLET (50 MG TOTAL) BY MOUTH EVERY 6 (SIX) HOURS AS NEEDED. FOR ANXIETY   LamoTRIgine  (LAMICTAL  XR) 300 MG TB24 24 hour tablet Take 1 tablet (300 mg total) by mouth daily.   Lurasidone  HCl (LATUDA ) 60 MG TABS Take 1 tablet (60 mg total) by mouth every evening.   nicotine  polacrilex (NICORETTE ) 4 MG gum Take 4 mg by mouth as needed for smoking cessation.   OLANZapine  (ZYPREXA ) 10 MG tablet TAKE 1.5 TO 2 TABLETS BY MOUTH AT BEDTIME   ondansetron  (ZOFRAN ) 4 MG tablet Take 1 tablet (4 mg total) by mouth every 8 (eight) hours as needed for nausea or vomiting. (Patient not taking: Reported on 07/18/2024)   Facility-Administered Medications Prior to Visit  Medication Dose Route Frequency Provider   0.9 %  sodium chloride  infusion  500 mL Intravenous Once Armbruster, Elspeth SQUIBB, MD    Reviewed past medical and social history.  Review of Systems Per HPI     Objective:    Physical Exam Vitals and nursing note reviewed.  Constitutional:      General: She is  not in acute distress.    Appearance: She is not ill-appearing.  Cardiovascular:     Rate and Rhythm: Normal rate.     Pulses: Normal pulses.  Pulmonary:     Effort: Pulmonary effort is normal.  Abdominal:     General: Bowel sounds are normal. There is no distension.     Palpations: Abdomen is soft.     Tenderness: There is abdominal tenderness. There is no guarding.     Comments: Generalized abdominal tenderness  Skin:    Findings: No rash.  Neurological:     Mental Status: She is alert and oriented to person, place, and time.    BP 128/70 (BP Location: Left Arm, Patient Position: Sitting, Cuff Size: Large)   Pulse 100   Temp 98.4 F (36.9 C) (Oral)   Ht 5' 2 (1.575 m)   Wt 143 lb 3.2 oz (65 kg)   LMP 07/07/2024   SpO2 99%   BMI 26.19 kg/m    No results found for any visits on 07/18/24.     Assessment & Plan:   Problem List Items Addressed This Visit   None Visit Diagnoses       Change in bowel movement    -  Primary   Relevant Orders   CBC   Comprehensive metabolic panel with GFR   Lipase       Suspect viral gastroenteritis. Unable to recollect stool sample due to resolved diarrhea. Consider  CT ABDOMEN if abnormal lipase, hepatic panel, or elevated WBC. Advised to Maintain a brat diet Start probiotics (align or curturelle) 1cap daily  No orders of the defined types were placed in this encounter.  Return for f/up with pcp.    Roselie Mood, NP

## 2024-07-18 NOTE — Telephone Encounter (Signed)
 Patient scheduled for 07/18/24

## 2024-07-19 ENCOUNTER — Ambulatory Visit: Payer: Self-pay | Admitting: Nurse Practitioner

## 2024-07-19 ENCOUNTER — Encounter: Payer: Self-pay | Admitting: Nurse Practitioner

## 2024-07-19 DIAGNOSIS — R1084 Generalized abdominal pain: Secondary | ICD-10-CM

## 2024-07-19 DIAGNOSIS — K529 Noninfective gastroenteritis and colitis, unspecified: Secondary | ICD-10-CM

## 2024-07-21 ENCOUNTER — Ambulatory Visit
Admission: RE | Admit: 2024-07-21 | Discharge: 2024-07-21 | Disposition: A | Discharge status: Home / Self Care | Source: Ambulatory Visit | Attending: Nurse Practitioner | Admitting: Nurse Practitioner

## 2024-07-21 DIAGNOSIS — R1084 Generalized abdominal pain: Secondary | ICD-10-CM

## 2024-07-21 DIAGNOSIS — K529 Noninfective gastroenteritis and colitis, unspecified: Secondary | ICD-10-CM

## 2024-07-29 ENCOUNTER — Ambulatory Visit: Payer: Self-pay | Admitting: Nurse Practitioner

## 2024-08-15 ENCOUNTER — Ambulatory Visit: Admitting: Family Medicine

## 2024-08-15 ENCOUNTER — Encounter: Payer: Self-pay | Admitting: Family Medicine

## 2024-08-15 VITALS — BP 126/74 | HR 85 | Temp 99.3°F | Ht 62.0 in | Wt 149.4 lb

## 2024-08-15 DIAGNOSIS — R300 Dysuria: Secondary | ICD-10-CM

## 2024-08-15 DIAGNOSIS — J069 Acute upper respiratory infection, unspecified: Secondary | ICD-10-CM

## 2024-08-15 LAB — POC URINALSYSI DIPSTICK (AUTOMATED)
Bilirubin, UA: NEGATIVE
Blood, UA: 10
Glucose, UA: NEGATIVE
Ketones, UA: NEGATIVE
Nitrite, UA: NEGATIVE
Protein, UA: NEGATIVE
Spec Grav, UA: 1.01 (ref 1.010–1.025)
Urobilinogen, UA: 0.2 U/dL
pH, UA: 6 (ref 5.0–8.0)

## 2024-08-15 MED ORDER — DOXYCYCLINE HYCLATE 100 MG PO TABS: 100.0000 mg | ORAL_TABLET | Freq: Two times a day (BID) | ORAL | 0 refills | Status: AC

## 2024-08-15 NOTE — Progress Notes (Unsigned)
 Diagnoses and Orders:   1. Dysuria    Meds ordered this encounter  Medications   doxycycline  (VIBRA -TABS) 100 MG tablet    Sig: Take 1 tablet (100 mg total) by mouth 2 (two) times daily.    Dispense:  20 tablet    Refill:  0   Orders Placed This Encounter  Procedures   POCT Urinalysis Dipstick (Automated)   Assessment & Plan:   Assessment and Plan Assessment & Plan Urinary tract infection Confirmed by urinalysis. Symptoms persistent for a month. Doxycycline  chosen for broad coverage. - Prescribed doxycycline  100 mg, one tablet in the morning and one at night for 7 days. - Advised to take doxycycline  with a full glass of water and avoid lying down immediately after taking to prevent esophageal irritation. - Instructed to monitor for photosensitivity and advised to avoid sun exposure. - Advised to report any yeast infections post-antibiotic treatment.  Viral upper respiratory infection Recent onset of symptoms, likely viral. No wheezing or significant respiratory distress. - Advised hydration and rest. - Prescribed doxycycline  as it also covers respiratory infections.  History of nicotine  dependence Quit smoking in 2020. Using Nicorette  gum as maintenance. - Continue Nicorette  gum as needed for nicotine  dependence management.  General health maintenance Routine health maintenance discussed. Previous cardiac and abdominal CT scans normal. - Continue routine health maintenance and preventive care.    Geni Shutter, DO, MS, FAAFP, Dipl. [facility] [address] [place], [address] Dept: [phone] Dept Fax: [phone]  Subjective:   History of Present Illness [name] is a 53 year old female who presents with symptoms of a urinary tract infection and upper respiratory illness.  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  Medications:   Show/hide medication list[1]  Objective:   BP 126/74 (BP Location: Left Arm, Cuff Size: Normal)   Pulse 85   Temp 99.3 F (37.4 C) (Temporal)   Ht 5' 2 (1.575 m)   Wt 149 lb 6.4 oz (67.8 kg)   SpO2 98%   BMI 27.33 kg/m   Physical Exam  Attestations:   {EW NEW PT ATTESTATIONS:34266}    [1]  Outpatient Medications Prior to Visit  Medication Sig   busPIRone (BUSPAR) 15 MG tablet Take 15 mg by mouth 2 (two) times daily.   hydrOXYzine  (ATARAX ) 50 MG tablet TAKE 1 TABLET (50 MG TOTAL) BY MOUTH EVERY 6 (SIX) HOURS AS NEEDED. FOR ANXIETY   LamoTRIgine  (LAMICTAL  XR) 300 MG TB24 24 hour tablet Take 1 tablet (300 mg total) by mouth daily.   Lurasidone  HCl (LATUDA ) 60 MG TABS Take 1 tablet (60 mg total) by mouth every evening.   nicotine  polacrilex (NICORETTE ) 4 MG gum Take 4 mg by mouth as needed for smoking cessation.  OLANZapine  (ZYPREXA ) 10 MG tablet TAKE 1.5 TO 2 TABLETS BY MOUTH AT BEDTIME   ondansetron  (ZOFRAN ) 4 MG tablet Take 1 tablet (4 mg total) by mouth every 8 (eight) hours as needed for nausea or vomiting.   Facility-Administered Medications Prior to Visit  Medication Dose Route Frequency Provider   0.9 %  sodium chloride  infusion  500 mL Intravenous Once  Armbruster, Elspeth SQUIBB, MD

## 2025-04-03 ENCOUNTER — Encounter: Admitting: Family Medicine
